# Patient Record
Sex: Female | Born: 1960 | Race: White | Hispanic: No | Marital: Married | Smoking: Never smoker
Health system: Southern US, Community
[De-identification: ages and names within clinical notes are randomized; demographics above are authoritative.]

## PROBLEM LIST (undated history)

## (undated) DIAGNOSIS — E079 Disorder of thyroid, unspecified: Secondary | ICD-10-CM

## (undated) DIAGNOSIS — M1990 Unspecified osteoarthritis, unspecified site: Secondary | ICD-10-CM

## (undated) DIAGNOSIS — D689 Coagulation defect, unspecified: Secondary | ICD-10-CM

## (undated) DIAGNOSIS — Z8619 Personal history of other infectious and parasitic diseases: Secondary | ICD-10-CM

## (undated) DIAGNOSIS — I341 Nonrheumatic mitral (valve) prolapse: Secondary | ICD-10-CM

## (undated) DIAGNOSIS — G43909 Migraine, unspecified, not intractable, without status migrainosus: Secondary | ICD-10-CM

## (undated) DIAGNOSIS — Z86718 Personal history of other venous thrombosis and embolism: Secondary | ICD-10-CM

## (undated) HISTORY — DX: Disorder of thyroid, unspecified: E07.9

## (undated) HISTORY — DX: Coagulation defect, unspecified: D68.9

## (undated) HISTORY — PX: OTHER SURGICAL HISTORY: SHX169

## (undated) HISTORY — DX: Nonrheumatic mitral (valve) prolapse: I34.1

## (undated) HISTORY — PX: TONSILLECTOMY: SUR1361

## (undated) HISTORY — DX: Personal history of other infectious and parasitic diseases: Z86.19

## (undated) HISTORY — DX: Migraine, unspecified, not intractable, without status migrainosus: G43.909

## (undated) HISTORY — DX: Unspecified osteoarthritis, unspecified site: M19.90

## (undated) HISTORY — DX: Personal history of other venous thrombosis and embolism: Z86.718

---

## 1997-04-17 ENCOUNTER — Inpatient Hospital Stay (HOSPITAL_COMMUNITY): Admission: AD | Admit: 1997-04-17 | Discharge: 1997-04-17 | Payer: Self-pay | Admitting: Obstetrics and Gynecology

## 1997-05-15 ENCOUNTER — Inpatient Hospital Stay (HOSPITAL_COMMUNITY): Admission: AD | Admit: 1997-05-15 | Discharge: 1997-05-17 | Payer: Self-pay | Admitting: Obstetrics and Gynecology

## 1997-06-12 ENCOUNTER — Other Ambulatory Visit: Admission: RE | Admit: 1997-06-12 | Discharge: 1997-06-12 | Payer: Self-pay | Admitting: Obstetrics and Gynecology

## 1997-12-12 ENCOUNTER — Observation Stay (HOSPITAL_COMMUNITY): Admission: RE | Admit: 1997-12-12 | Discharge: 1997-12-13 | Payer: Self-pay | Admitting: Urology

## 1998-10-01 ENCOUNTER — Other Ambulatory Visit: Admission: RE | Admit: 1998-10-01 | Discharge: 1998-10-01 | Payer: Self-pay | Admitting: Obstetrics and Gynecology

## 2000-02-09 ENCOUNTER — Other Ambulatory Visit: Admission: RE | Admit: 2000-02-09 | Discharge: 2000-02-09 | Payer: Self-pay | Admitting: Obstetrics and Gynecology

## 2000-03-25 ENCOUNTER — Emergency Department (HOSPITAL_COMMUNITY): Admission: EM | Admit: 2000-03-25 | Discharge: 2000-03-25 | Payer: Self-pay | Admitting: Emergency Medicine

## 2000-03-29 ENCOUNTER — Encounter: Admission: RE | Admit: 2000-03-29 | Discharge: 2000-05-05 | Payer: Self-pay | Admitting: *Deleted

## 2000-06-07 ENCOUNTER — Encounter: Payer: Self-pay | Admitting: Pulmonary Disease

## 2000-06-07 ENCOUNTER — Ambulatory Visit (HOSPITAL_COMMUNITY): Admission: RE | Admit: 2000-06-07 | Discharge: 2000-06-07 | Payer: Self-pay | Admitting: Pulmonary Disease

## 2001-01-30 ENCOUNTER — Encounter: Admission: RE | Admit: 2001-01-30 | Discharge: 2001-03-13 | Payer: Self-pay | Admitting: Orthopedic Surgery

## 2001-06-26 ENCOUNTER — Other Ambulatory Visit: Admission: RE | Admit: 2001-06-26 | Discharge: 2001-06-26 | Payer: Self-pay | Admitting: Obstetrics and Gynecology

## 2002-02-13 ENCOUNTER — Ambulatory Visit (HOSPITAL_BASED_OUTPATIENT_CLINIC_OR_DEPARTMENT_OTHER): Admission: RE | Admit: 2002-02-13 | Discharge: 2002-02-13 | Payer: Self-pay | Admitting: Orthopedic Surgery

## 2002-02-13 ENCOUNTER — Encounter (INDEPENDENT_AMBULATORY_CARE_PROVIDER_SITE_OTHER): Payer: Self-pay | Admitting: Specialist

## 2002-08-27 ENCOUNTER — Other Ambulatory Visit: Admission: RE | Admit: 2002-08-27 | Discharge: 2002-08-27 | Payer: Self-pay | Admitting: Obstetrics and Gynecology

## 2003-09-30 ENCOUNTER — Other Ambulatory Visit: Admission: RE | Admit: 2003-09-30 | Discharge: 2003-09-30 | Payer: Self-pay | Admitting: Obstetrics and Gynecology

## 2003-11-06 ENCOUNTER — Ambulatory Visit: Payer: Self-pay | Admitting: Internal Medicine

## 2003-11-07 ENCOUNTER — Ambulatory Visit (HOSPITAL_BASED_OUTPATIENT_CLINIC_OR_DEPARTMENT_OTHER): Admission: RE | Admit: 2003-11-07 | Discharge: 2003-11-07 | Payer: Self-pay | Admitting: Orthopedic Surgery

## 2003-11-07 ENCOUNTER — Ambulatory Visit (HOSPITAL_COMMUNITY): Admission: RE | Admit: 2003-11-07 | Discharge: 2003-11-07 | Payer: Self-pay | Admitting: Orthopedic Surgery

## 2003-11-07 ENCOUNTER — Encounter (INDEPENDENT_AMBULATORY_CARE_PROVIDER_SITE_OTHER): Payer: Self-pay | Admitting: Specialist

## 2003-11-17 ENCOUNTER — Ambulatory Visit: Payer: Self-pay | Admitting: Cardiology

## 2003-12-10 ENCOUNTER — Ambulatory Visit: Payer: Self-pay | Admitting: Internal Medicine

## 2003-12-24 ENCOUNTER — Ambulatory Visit: Payer: Self-pay | Admitting: *Deleted

## 2004-03-01 ENCOUNTER — Ambulatory Visit: Payer: Self-pay | Admitting: Internal Medicine

## 2004-03-25 ENCOUNTER — Ambulatory Visit: Payer: Self-pay

## 2004-03-25 ENCOUNTER — Ambulatory Visit: Payer: Self-pay | Admitting: Internal Medicine

## 2004-04-01 ENCOUNTER — Ambulatory Visit: Payer: Self-pay

## 2004-04-06 ENCOUNTER — Ambulatory Visit: Payer: Self-pay | Admitting: Internal Medicine

## 2004-05-13 ENCOUNTER — Ambulatory Visit: Payer: Self-pay | Admitting: Cardiology

## 2004-05-24 ENCOUNTER — Ambulatory Visit: Payer: Self-pay | Admitting: Internal Medicine

## 2004-08-25 ENCOUNTER — Ambulatory Visit: Payer: Self-pay | Admitting: Cardiology

## 2004-09-10 ENCOUNTER — Ambulatory Visit: Payer: Self-pay | Admitting: Internal Medicine

## 2004-10-06 ENCOUNTER — Ambulatory Visit: Payer: Self-pay | Admitting: Cardiology

## 2004-11-10 ENCOUNTER — Ambulatory Visit: Payer: Self-pay | Admitting: Internal Medicine

## 2004-11-10 ENCOUNTER — Ambulatory Visit: Payer: Self-pay | Admitting: Cardiology

## 2004-11-16 ENCOUNTER — Other Ambulatory Visit: Admission: RE | Admit: 2004-11-16 | Discharge: 2004-11-16 | Payer: Self-pay | Admitting: Obstetrics and Gynecology

## 2004-12-06 ENCOUNTER — Ambulatory Visit: Payer: Self-pay | Admitting: Internal Medicine

## 2004-12-10 ENCOUNTER — Ambulatory Visit: Payer: Self-pay | Admitting: Internal Medicine

## 2004-12-10 ENCOUNTER — Ambulatory Visit: Payer: Self-pay | Admitting: Cardiology

## 2004-12-13 ENCOUNTER — Ambulatory Visit: Payer: Self-pay | Admitting: Internal Medicine

## 2004-12-13 ENCOUNTER — Ambulatory Visit (HOSPITAL_COMMUNITY): Admission: RE | Admit: 2004-12-13 | Discharge: 2004-12-14 | Payer: Self-pay | Admitting: Internal Medicine

## 2004-12-16 ENCOUNTER — Ambulatory Visit: Payer: Self-pay | Admitting: Cardiology

## 2004-12-20 ENCOUNTER — Ambulatory Visit: Payer: Self-pay | Admitting: Internal Medicine

## 2005-01-10 ENCOUNTER — Ambulatory Visit: Payer: Self-pay | Admitting: Cardiology

## 2005-03-01 ENCOUNTER — Ambulatory Visit: Payer: Self-pay | Admitting: *Deleted

## 2005-03-30 ENCOUNTER — Ambulatory Visit: Payer: Self-pay | Admitting: Internal Medicine

## 2005-04-13 ENCOUNTER — Ambulatory Visit: Payer: Self-pay | Admitting: *Deleted

## 2005-05-26 ENCOUNTER — Ambulatory Visit: Payer: Self-pay | Admitting: Cardiology

## 2005-06-15 ENCOUNTER — Ambulatory Visit: Payer: Self-pay | Admitting: Cardiovascular Disease

## 2005-08-23 ENCOUNTER — Ambulatory Visit: Payer: Self-pay | Admitting: Cardiology

## 2005-09-26 ENCOUNTER — Ambulatory Visit: Payer: Self-pay | Admitting: Cardiology

## 2005-11-09 ENCOUNTER — Ambulatory Visit: Payer: Self-pay | Admitting: Cardiology

## 2005-12-07 ENCOUNTER — Ambulatory Visit: Payer: Self-pay | Admitting: Cardiology

## 2006-01-11 ENCOUNTER — Ambulatory Visit: Payer: Self-pay | Admitting: Cardiology

## 2006-02-22 ENCOUNTER — Ambulatory Visit: Payer: Self-pay | Admitting: Cardiovascular Disease

## 2006-04-25 ENCOUNTER — Ambulatory Visit: Payer: Self-pay | Admitting: Cardiology

## 2006-06-13 ENCOUNTER — Ambulatory Visit: Payer: Self-pay | Admitting: Cardiology

## 2006-07-28 ENCOUNTER — Ambulatory Visit: Payer: Self-pay | Admitting: Cardiology

## 2006-10-11 ENCOUNTER — Ambulatory Visit: Payer: Self-pay | Admitting: Internal Medicine

## 2006-11-27 ENCOUNTER — Ambulatory Visit: Payer: Self-pay | Admitting: Cardiology

## 2007-01-04 HISTORY — PX: ELBOW SURGERY: SHX618

## 2007-01-10 ENCOUNTER — Ambulatory Visit: Payer: Self-pay | Admitting: Cardiovascular Disease

## 2007-02-07 ENCOUNTER — Ambulatory Visit: Payer: Self-pay | Admitting: Internal Medicine

## 2007-03-28 ENCOUNTER — Ambulatory Visit: Payer: Self-pay | Admitting: Cardiology

## 2007-05-09 ENCOUNTER — Ambulatory Visit: Payer: Self-pay | Admitting: Internal Medicine

## 2007-06-21 ENCOUNTER — Ambulatory Visit: Payer: Self-pay | Admitting: Cardiology

## 2007-08-31 ENCOUNTER — Ambulatory Visit: Payer: Self-pay | Admitting: Cardiology

## 2007-11-07 ENCOUNTER — Ambulatory Visit: Payer: Self-pay | Admitting: Cardiovascular Disease

## 2007-12-31 ENCOUNTER — Ambulatory Visit: Payer: Self-pay | Admitting: Cardiology

## 2008-02-13 ENCOUNTER — Ambulatory Visit: Payer: Self-pay | Admitting: Cardiology

## 2008-05-01 ENCOUNTER — Ambulatory Visit: Payer: Self-pay | Admitting: Internal Medicine

## 2008-06-03 ENCOUNTER — Encounter: Payer: Self-pay | Admitting: *Deleted

## 2008-07-09 ENCOUNTER — Encounter: Payer: Self-pay | Admitting: *Deleted

## 2008-07-22 ENCOUNTER — Ambulatory Visit: Payer: Self-pay | Admitting: Cardiology

## 2008-07-22 LAB — CONVERTED CEMR LAB
POC INR: 2
Prothrombin Time: 17.5 s

## 2008-09-02 ENCOUNTER — Ambulatory Visit: Payer: Self-pay | Admitting: Cardiology

## 2008-09-02 LAB — CONVERTED CEMR LAB: POC INR: 2.7

## 2008-09-11 ENCOUNTER — Telehealth: Payer: Self-pay | Admitting: Cardiovascular Disease

## 2008-10-14 ENCOUNTER — Ambulatory Visit: Payer: Self-pay | Admitting: Cardiology

## 2008-10-14 LAB — CONVERTED CEMR LAB: POC INR: 2.7

## 2008-11-25 ENCOUNTER — Encounter: Admission: RE | Admit: 2008-11-25 | Discharge: 2008-11-25 | Payer: Self-pay | Admitting: Obstetrics and Gynecology

## 2008-12-02 ENCOUNTER — Ambulatory Visit: Payer: Self-pay | Admitting: Cardiology

## 2008-12-02 LAB — CONVERTED CEMR LAB: POC INR: 2.6

## 2009-01-03 HISTORY — PX: ROTATOR CUFF REPAIR: SHX139

## 2009-03-04 ENCOUNTER — Ambulatory Visit: Payer: Self-pay | Admitting: Cardiology

## 2009-04-14 ENCOUNTER — Encounter: Admission: RE | Admit: 2009-04-14 | Discharge: 2009-06-10 | Payer: Self-pay | Admitting: Sports Medicine

## 2009-04-20 ENCOUNTER — Ambulatory Visit: Payer: Self-pay | Admitting: Cardiology

## 2009-05-26 ENCOUNTER — Encounter: Admission: RE | Admit: 2009-05-26 | Discharge: 2009-05-26 | Payer: Self-pay | Admitting: Obstetrics and Gynecology

## 2009-06-16 ENCOUNTER — Ambulatory Visit: Payer: Self-pay | Admitting: Cardiovascular Disease

## 2009-06-16 LAB — CONVERTED CEMR LAB: POC INR: 1.2

## 2009-06-29 ENCOUNTER — Ambulatory Visit: Payer: Self-pay | Admitting: Cardiology

## 2009-06-29 LAB — CONVERTED CEMR LAB: POC INR: 1.3

## 2009-07-01 ENCOUNTER — Encounter: Admission: RE | Admit: 2009-07-01 | Discharge: 2009-09-29 | Payer: Self-pay | Admitting: Sports Medicine

## 2009-07-15 ENCOUNTER — Ambulatory Visit: Payer: Self-pay | Admitting: Internal Medicine

## 2009-09-29 ENCOUNTER — Ambulatory Visit: Payer: Self-pay | Admitting: Cardiovascular Disease

## 2009-11-17 ENCOUNTER — Ambulatory Visit: Payer: Self-pay | Admitting: Cardiovascular Disease

## 2009-11-17 LAB — CONVERTED CEMR LAB: POC INR: 3.5

## 2010-01-01 ENCOUNTER — Ambulatory Visit: Payer: Self-pay

## 2010-01-26 ENCOUNTER — Ambulatory Visit: Admission: RE | Admit: 2010-01-26 | Discharge: 2010-01-26 | Payer: Self-pay | Source: Home / Self Care

## 2010-01-26 LAB — CONVERTED CEMR LAB: POC INR: 2.2

## 2010-02-04 NOTE — Medication Information (Signed)
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 no    Recent/future dental: no  Any missed doses?: no       Is patient compliant with meds? yes       Allergies: No Known Drug Allergies  Anticoagulation Management History:      The patient is taking warfarin and comes in today for a routine follow up visit.  Negative risk factors for bleeding include an age less than 78 years old.  The bleeding index is 'low risk'.  Negative CHADS2 values include Age > 77 years old.  The start date was 05/05/1998.  Anticoagulation responsible provider: Juanda Chance MD, Smitty Cords.  INR POC: 2.2.  Cuvette Lot#: [id number].  Exp: 06/2010.    Anticoagulation Management Assessment/Plan:      The patient's current anticoagulation dose is Warfarin sodium 6 mg tabs: Use as directed by Anticoagulation Clinic.  The target INR is 2 - 3.  The next INR is due 06/02/2009.  Anticoagulation instructions were given to patient.  Results were reviewed/authorized by Bethena Midget, RN, BSN.  She was notified by Bethena Midget, RN, BSN.         Prior Anticoagulation Instructions: INR 2.7  Continue on same dosage 6mg  daily.  Recheck in 6 weeks.    Current Anticoagulation Instructions: INR 2.2 Continue 6mg s daily. Recheck in 6 weeks.

## 2010-02-04 NOTE — Medication Information (Signed)
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    Health status changes: no    Bleeding/hemorrhagic complications: no    Recent/future hospitalizations: no    Any changes in medication regimen? yes       Details: Bad HA yesterday took Excedrine Migraine yesterday  Recent/future dental: no  Any missed doses?: no       Is patient compliant with meds? yes       Allergies (verified): No Known Drug Allergies  Anticoagulation Management History:      The patient is taking warfarin and comes in today for a routine follow up visit.  Negative risk factors for bleeding include an age less than 12 years old.  The bleeding index is 'low risk'.  Negative CHADS2 values include Age > 40 years old.  The start date was 05/05/1998.  Anticoagulation responsible provider: Riley Kill MD, Maisie Fus.  INR POC: 2.7.  Cuvette Lot#: [id number].  Exp: 05/2010.    Anticoagulation Management Assessment/Plan:      The patient's current anticoagulation dose is Warfarin sodium 6 mg tabs: Use as directed by Anticoagulation Clinic.  The target INR is 2 - 3.  The next INR is due 04/15/2009.  Anticoagulation instructions were given to patient.  Results were reviewed/authorized by Cloyde Reams, RN, BSN.  She was notified by Cloyde Reams RN.         Prior Anticoagulation Instructions: INR 2.6. Continue taking 1 tablet daily. Recheck in 6 weeks.  Current Anticoagulation Instructions: INR 2.7  Continue on same dosage 6mg  daily.  Recheck in 6 weeks.   Prescriptions: WARFARIN SODIUM 6 MG TABS (WARFARIN SODIUM) Use as directed by Anticoagulation Clinic  #90 x 1   Entered by:   Cloyde Reams RN   Authorized by:   Verne Carrow, MD   Signed by:   Cloyde Reams RN on 03/04/2009   Method used:   Faxed to ...       [facility] (mail-order)       [address]       [place], [place]  [address]       Ph: [phone]       Fax: [phone]   RxID:   [id number]

## 2010-02-04 NOTE — Medication Information (Signed)
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 no    Recent/future dental: no  Any missed doses?: no       Is patient compliant with meds? yes       Allergies: No Known Drug Allergies  Anticoagulation Management History:      The patient is taking warfarin and comes in today for a routine follow up visit.  Negative risk factors for bleeding include an age less than 51 years old.  The bleeding index is 'low risk'.  Negative CHADS2 values include Age > 47 years old.  The start date was 05/05/1998.  Anticoagulation responsible provider: Graciela Husbands MD, Viviann Spare.  INR POC: 2.2.  Cuvette Lot#: [id number].  Exp: 01/2011.    Anticoagulation Management Assessment/Plan:      The patient's current anticoagulation dose is Warfarin sodium 6 mg tabs: Use as directed by Anticoagulation Clinic.  The target INR is 2 - 3.  The next INR is due 03/09/2010.  Anticoagulation instructions were given to patient.  Results were reviewed/authorized by Windell Hummingbird, RN.  She was notified by Windell Hummingbird, RN.         Prior Anticoagulation Instructions: INR 3.5 Skip tomorrow's dose  then resume 6mg s daily. Recheck in 6 weeks.   Current Anticoagulation Instructions: INR 2.2 Continue taking 1 tablet every day. Recheck in 6 weeks. Prescriptions: WARFARIN SODIUM 6 MG TABS (WARFARIN SODIUM) Use as directed by Anticoagulation Clinic  #90 x 1   Entered by:   Weston Brass PharmD   Authorized by:   Nathen May, MD, [facility]   Signed by:   Weston Brass PharmD on 01/26/2010   Method used:   Faxed  to ...       [facility] (mail-order)             , [place]         Ph: [phone]       Fax: [phone]   RxID:   [id number]

## 2010-02-04 NOTE — Medication Information (Signed)
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 no    Recent/future dental: no  Any missed doses?: no       Is patient compliant with meds? yes      Comments: Pt aware of her risk with elevated INR and INR that are not in range.  She states due to co-pays she doesn't want to come in sooner than 6 weeks.   Allergies: No Known Drug Allergies  Anticoagulation Management History:      The patient is taking warfarin and comes in today for a routine follow up visit.  Negative risk factors for bleeding include an age less than 35 years old.  The bleeding index is 'low risk'.  Negative CHADS2 values include Age > 2 years old.  The start date was 05/05/1998.  Anticoagulation responsible provider: Excell Seltzer MD, Casimiro Needle.  INR POC: 3.5.  Cuvette Lot#: [id number].  Exp: 12/2010.    Anticoagulation Management Assessment/Plan:      The patient's current anticoagulation dose is Warfarin sodium 6 mg tabs: Use as directed by Anticoagulation Clinic.  The target INR is 2 - 3.  The next INR is due 01/01/2010.  Anticoagulation instructions were given to patient.  Results were reviewed/authorized by Bethena Midget, RN, BSN.  She was notified by Bethena Midget, RN, BSN.         Prior Anticoagulation Instructions: INR 2.5  Continue on 6mg  daily.  Recheck in 6 weeks.    Current Anticoagulation Instructions: INR 3.5 Skip tomorrow's dose  then resume 6mg s daily. Recheck in 6 weeks.

## 2010-02-04 NOTE — Medication Information (Signed)
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 yes       Details: Celebrex prn  Recent/future dental: no  Any missed doses?: no       Is patient compliant with meds? yes       Allergies: No Known Drug Allergies  Anticoagulation Management History:      The patient is taking warfarin and comes in today for a routine follow up visit.  Negative risk factors for bleeding include an age less than 7 years old.  The bleeding index is 'low risk'.  Negative CHADS2 values include Age > 17 years old.  The start date was 05/05/1998.  Anticoagulation responsible Holle Sprick: Eden Emms MD, Theron Arista.  INR POC: 2.5.  Cuvette Lot#: [id number].  Exp: 11/2010.    Anticoagulation Management Assessment/Plan:      The patient's current anticoagulation dose is Warfarin sodium 6 mg tabs: Use as directed by Anticoagulation Clinic.  The target INR is 2 - 3.  The next INR is due 11/10/2009.  Anticoagulation instructions were given to patient.  Results were reviewed/authorized by Cloyde Reams, RN, BSN.  She was notified by Cloyde Reams RN.         Prior Anticoagulation Instructions: INR 2.6  Continue same dose of 6mg  daily.  Re-check INR in 4 weeks.    Current Anticoagulation Instructions: INR 2.5  Continue on 6mg  daily.  Recheck in 6 weeks.   Prescriptions: WARFARIN SODIUM 6 MG TABS (WARFARIN SODIUM) Use as directed by Anticoagulation Clinic  #90 x 1   Entered by:   Cloyde Reams RN   Authorized by:   Laren Boom, MD, [facility]   Signed by:   Cloyde Reams RN on  09/29/2009   Method used:   Faxed to ...       [facility] (mail-order)       [address]       [place], [place]  [address]       Ph: [phone]       Fax: [phone]   RxID:   [id number]

## 2010-02-04 NOTE — Medication Information (Signed)
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 no    Recent/future dental: no  Any missed doses?: yes     Details: pt off coumadin for shoulder surgery  Is patient compliant with meds? yes      Comments: Pt confused and did not know when to restart Coumadin and did not understand to take Coumadin and Lovenox at the same time.  Restarted Coumadin on last Thursday or Friday. Has not been taking Lovenox injections.  Going on vacation next week and cannot come back in until next Wednesday.   Allergies: No Known Drug Allergies  Anticoagulation Management History:      The patient is taking warfarin and comes in today for a routine follow up visit.  Negative risk factors for bleeding include an age less than 50 years old.  The bleeding index is 'low risk'.  Negative CHADS2 values include Age > 13 years old.  The start date was 05/05/1998.  Anticoagulation responsible provider: Jens Som MD, Arlys John.  INR POC: 1.3.  Cuvette Lot#: [id number].  Exp: 08/2010.    Anticoagulation Management Assessment/Plan:      The patient's current anticoagulation dose is Warfarin sodium 6 mg tabs: Use as directed by Anticoagulation Clinic.  The target INR is 2 - 3.  The next INR is due 07/08/2009.  Anticoagulation instructions were given to patient.  Results were reviewed/authorized by Weston Brass, PharmD.  She was notified by Weston Brass PharmD.         Prior Anticoagulation Instructions: INR 1.2 Hold coumadin. Start Lovenox 120mg s daily. Take last Injection on 06/22/09 in Morning.  Restart coumadin and Lovenox post procedure per Dr. Thomasena Edis instructions. When you restart Coumadin take 1 1/2 tablets x 2 days.  Continue Lovenox until next appt.   Current Anticoagulation Instructions: INR 1.3  Take 2 tablets today, 1 1/2 tablets x 2 days then resume normal dose of 1 tablet every day.  Continue Lovenox 120mg  daily until Friday.  Recheck INR in 1 week.

## 2010-02-04 NOTE — Medication Information (Signed)
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 no    Recent/future dental: no  Any missed doses?: yes     Details: Missed Sat and Sun dose.   Is patient compliant with meds? yes      Comments: Pending Rt Shoulder Surgery on 06/23/09.  Received fax from Dr. Thomasena Edis with instructions to cover with Lovenox pre and post operatively.   Allergies: No Known Drug Allergies  Anticoagulation Management History:      The patient is taking warfarin and comes in today for a routine follow up visit.  Negative risk factors for bleeding include an age less than 75 years old.  The bleeding index is 'low risk'.  Negative CHADS2 values include Age > 77 years old.  The start date was 05/05/1998.  Anticoagulation responsible provider: Juanda Chance MD, Smitty Cords.  INR POC: 1.2.  Cuvette Lot#: [id number].  Exp: 08/2010.    Anticoagulation Management Assessment/Plan:      The patient's current anticoagulation dose is Warfarin sodium 6 mg tabs: Use as directed by Anticoagulation Clinic.  The target INR is 2 - 3.  The next INR is due 06/29/2009.  Anticoagulation instructions were given to patient.  Results were reviewed/authorized by Bethena Midget, RN, BSN.  She was notified by Bethena Midget, RN, BSN.         Prior Anticoagulation Instructions: INR 2.2 Continue 6mg s daily. Recheck in 6 weeks.   Current Anticoagulation Instructions: INR 1.2 Hold coumadin. Start Lovenox 120mg s daily. Take last Injection on 06/22/09 in Morning. Restart coumadin and Lovenox post procedure per Dr. Thomasena Edis  instructions. When you restart Coumadin take 1 1/2 tablets x 2 days.  Continue Lovenox until next appt.  Prescriptions: LOVENOX 120 MG/0.8ML SOLN (ENOXAPARIN SODIUM) Inject one syringe subcutaneously into abdomen daily in morning  #10 x 1   Entered by:   Bethena Midget, RN, BSN   Authorized by:   Norva Karvonen, MD   Signed by:   Bethena Midget, RN, BSN on 06/16/2009   Method used:   Electronically to        [facility] [address] [id number] (retail)       [address]       [place], [place]  [address]       Ph: [phone]       Fax: [phone]   RxID:   [id number]

## 2010-02-04 NOTE — Medication Information (Signed)
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 no    Recent/future dental: no  Any missed doses?: no       Is patient compliant with meds? yes       Allergies: No Known Drug Allergies  Anticoagulation Management History:      The patient is taking warfarin and comes in today for a routine follow up visit.  Negative risk factors for bleeding include an age less than 73 years old.  The bleeding index is 'low risk'.  Negative CHADS2 values include Age > 11 years old.  The start date was 05/05/1998.  Anticoagulation responsible provider: Yelitza Reach MD, Fayrene Fearing.  INR POC: 2.6.  Cuvette Lot#: [id number].  Exp: 09/2010.    Anticoagulation Management Assessment/Plan:      The patient's current anticoagulation dose is Warfarin sodium 6 mg tabs: Use as directed by Anticoagulation Clinic.  The target INR is 2 - 3.  The next INR is due 08/26/2009.  Anticoagulation instructions were given to patient.  Results were reviewed/authorized by Weston Brass, PharmD.  She was notified by Dillard Cannon.         Prior Anticoagulation Instructions: INR 1.3  Take 2 tablets today, 1 1/2 tablets x 2 days then resume normal dose of 1 tablet every day.  Continue Lovenox 120mg  daily until Friday.  Recheck INR in 1 week.   Current Anticoagulation Instructions: INR 2.6  Continue same dose of 6mg  daily.  Re-check INR in 4 weeks.

## 2010-02-15 DIAGNOSIS — Z86711 Personal history of pulmonary embolism: Secondary | ICD-10-CM | POA: Insufficient documentation

## 2010-02-15 DIAGNOSIS — D6859 Other primary thrombophilia: Secondary | ICD-10-CM

## 2010-02-15 DIAGNOSIS — I2699 Other pulmonary embolism without acute cor pulmonale: Secondary | ICD-10-CM

## 2010-03-09 ENCOUNTER — Encounter (INDEPENDENT_AMBULATORY_CARE_PROVIDER_SITE_OTHER): Payer: Managed Care, Other (non HMO)

## 2010-03-09 ENCOUNTER — Encounter: Payer: Self-pay | Admitting: Cardiology

## 2010-03-09 DIAGNOSIS — I2699 Other pulmonary embolism without acute cor pulmonale: Secondary | ICD-10-CM

## 2010-03-09 LAB — CONVERTED CEMR LAB: POC INR: 2.2

## 2010-03-16 NOTE — Medication Information (Signed)
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 no    Recent/future dental: no  Any missed doses?: no       Is patient compliant with meds? yes       Current Medications (verified): 1)  Wellbutrin Xl 300 Mg Xr24h-Tab (Bupropion Hcl) .... Take 1 Tablet Daily 2)  Warfarin Sodium 6 Mg Tabs (Warfarin Sodium) .... Use As Directed By Anticoagulation Clinic 3)  Lovenox 120 Mg/0.40ml Soln (Enoxaparin Sodium) .... Inject One Syringe Subcutaneously Into Abdomen Daily in Morning  Allergies (verified): No Known Drug Allergies  Anticoagulation Management History:      Negative risk factors for bleeding include an age less than 63 years old.  The bleeding index is 'low risk'.  Negative CHADS2 values include Age > 96 years old.  The start date was 05/05/1998.  Anticoagulation responsible provider: Antoine Poche MD, Fayrene Fearing.  INR POC: 2.2.  Exp: 01/2011.    Anticoagulation Management Assessment/Plan:      The patient's current anticoagulation dose is Warfarin sodium 6 mg tabs: Use as directed by Anticoagulation Clinic.  The target INR is 2 - 3.  The next INR is due 03/09/2010.  Anticoagulation instructions were given to patient.  Results were reviewed/authorized by Samantha Crimes, PharmD.         Prior Anticoagulation Instructions: INR 2.2 Continue taking 1 tablet every day. Recheck in 6 weeks.  Current Anticoagulation Instructions: Cont with current regimen Return to clinic 4/17 @ 2 pm

## 2010-04-20 ENCOUNTER — Ambulatory Visit (INDEPENDENT_AMBULATORY_CARE_PROVIDER_SITE_OTHER): Payer: Managed Care, Other (non HMO) | Admitting: *Deleted

## 2010-04-20 DIAGNOSIS — D6859 Other primary thrombophilia: Secondary | ICD-10-CM

## 2010-04-20 DIAGNOSIS — I2699 Other pulmonary embolism without acute cor pulmonale: Secondary | ICD-10-CM

## 2010-04-20 LAB — POCT INR: INR: 2.6

## 2010-04-25 ENCOUNTER — Emergency Department (HOSPITAL_BASED_OUTPATIENT_CLINIC_OR_DEPARTMENT_OTHER)
Admission: EM | Admit: 2010-04-25 | Discharge: 2010-04-25 | Disposition: A | Discharge status: Home / Self Care | Payer: Managed Care, Other (non HMO) | Attending: Emergency Medicine | Admitting: Emergency Medicine

## 2010-04-25 DIAGNOSIS — T622X1A Toxic effect of other ingested (parts of) plant(s), accidental (unintentional), initial encounter: Secondary | ICD-10-CM | POA: Insufficient documentation

## 2010-04-25 DIAGNOSIS — L255 Unspecified contact dermatitis due to plants, except food: Secondary | ICD-10-CM | POA: Insufficient documentation

## 2010-05-21 NOTE — Op Note (Signed)
   NAMEDUSTINE, BERTINI                        ACCOUNT NO.:  [id number]   MEDICAL RECORD NO.:  [id number]                   PATIENT TYPE:  AMB   LOCATION:  [facility]                                  FACILITY:  [facility]   PHYSICIAN:  Artist Pais. Mina Marble, M.D.           DATE OF BIRTH:  Dec 04, 1960   DATE OF PROCEDURE:  02/13/2002  DATE OF DISCHARGE:                                 OPERATIVE REPORT   PREOPERATIVE DIAGNOSIS:  Right elbow lateral epicondylitis.   POSTOPERATIVE DIAGNOSIS:  Right elbow lateral epicondylitis.   PROCEDURE:  Right Nirschl procedure.   SURGEON:  Artist Pais. Mina Marble, M.D.   ASSISTANT:  None.   ANESTHESIA:  General.   TOURNIQUET TIME:  40 minutes.   COMPLICATIONS:  None.   DRAINS:  None.   DESCRIPTION OF PROCEDURE:  The patient was taken to the operating room.  After the induction general anesthesia, the upper extremity was prepped and  draped in usual sterile fashion.  An Esmarch was used to exsanguinate the  limb.  Tourniquet was inflated to 250 mmHg.  At this point in time, a  longitudinal incision was made across the palpable lateral epicondyle of the  right elbow and extended proximally and distally for 5 cm.  The incision was  taken down through the skin and subcutaneous tissues.  Once this was done,  the lateral epicondyle was subperiosteally elevated in the interval between  the common extensor and ECRL was identified and split, thus exposing the  underlying ECB tendon.  The ECB tendon was debrided down to cancellous bone.  Once this was done, the joint was inspected.  At the radiocapitellar level,  there were no osteophytes or loose bodies noted and the wound was then  thoroughly irrigated.  The interval between the ECRL and the [facility] was closed  with 0 Vicryl and with buried interrupted sutures followed by skin closure  with 3-0 Prolene.  Steri-Strips, 4 x 4 fluffs, and a splint with the wrist  in extension and the elbow flexed 90 degrees forearm  rotation was placed.  The patient tolerated the procedure well.  She went to recovery in stable  fashion.                                               Artist Pais Mina Marble, M.D.    MAW/MEDQ  D:  02/13/2002  T:  02/13/2002  Job:  [id number]
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
## 2010-05-21 NOTE — Op Note (Signed)
NAMESHYENNE, MAGGARD NO.:  [id number]   MEDICAL RECORD NO.:  [id number]          PATIENT TYPE:  OIB   LOCATION:  2852                         FACILITY:  [facility]   PHYSICIAN:  Doylene Canning. Ladona Ridgel, M.D.  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   INTRODUCTION:  The patient is a very pleasant 50 year old woman with a  history of recurrent tachy palpitations and documented SVT in the past  despite medical therapy.  The patient is now referred for electrophysiologic  study and catheter ablation.   DESCRIPTION OF PROCEDURE:  After informed consent was obtained, the patient  was taken to the diagnostic EP lab in the fasting state.  After the usual  preparation and draping, intravenous fentanyl  and midazolam were given for  sedation.  A 6-French hexapolar catheter was inserted percutaneously into  the right jugular vein and advanced to the coronary sinus.  A 5-French  quadripolar catheter was inserted percutaneously into the right femoral vein  and advanced to the RV apex. A  5-French quadripolar catheter was inserted  percutaneously in the right femoral vein and advanced to the HIS bundle  region.  Measurement of basic intervals, rapid ventricular pacing was  carried out from the RV apex and stepwise decreased down to 500 msec where  Wenckebach was observed.  During rapid ventricular pacing, the atrial  activation was midline and decremental. Next, programmed ventricular  stimulation was carried out from the RV apex and the basic drive cycle  length of 478 msec.  This S1 and S2 interval __________  decreased down to  400 msec.  The retrograde AV node ARP is observed. During programming of  ventricular stimulation, the atrial was midline and decremental.   Next, rapid atrial  pacing was carried out from the coronary sinus as well as  the high right atrium.  The patient's cycle length was 600 msec and  __________  decreased down to 510 msec or AV Wenckebach was observed.  During rapid atrial pacing, the P-R interval was greater than the R  interval, but there was no SVT induced.   Atrial stimulation was then carried out from the coronary sinus and the high  right atrium at basic drive cycle length of 295 msec.  The S1 and S2  interval was __________  decreased from 500 msec down to 380 msec with where  AV node ERP was observed.  During programmed atrial stimulation, there were  multiple AH jumps and echo beats but no inducible SVT.  Isoproterenol was  then infused at a rate of 1-3 mcg per minute.  Additional rapid atrial  pacing was carried out at a pacing cycle length of 350 msec.  SVT was  induced.  This was a narrow QRS tachycardia with a cycle length of 380 msec.  Mapping demonstrated early atrial activation in the proximal CS as well as  the HIS bundle region.  PVCs were placed during  tachycardia at the time of  HIS bundle refractions and this failed to preexcite the atrium.  During  ventricular pacing during tachycardia, there was a BAV conduction sequence  and rapid ventricular pacing rather terminated the SVT.  With all of the  above diagnosis of unusual AV node reentry tachycardia was made, and the 7-  [place] quadripolar ablation catheter was advanced into the right atrium.  Mapping was then carried out in the right atrium.  The Koch's triangle was  oriented superiorly and was somewhat smaller than would be expected.  RF  energy application was delivered.  There was a total of 12 brief RF energy  applications delivered.  During RF energy application, there were recurrent  junctional beats and many junctional beats with retrograde block in the  atrium.  With this in mind, RF energy application was kept very brief and  when there was VA block, RF was  immediately terminated.  Following RF energy  application, additional rapid atrial and ventricular pacing was carried out  on isoproterenol and there was no inducible SVT.  At this point the  catheters were removed.  Hemostasis was assured and the patient was returned  to her room in satisfactory condition.   COMPLICATIONS:  There were no immediate complications.   RESULTS:  1.  Baseline ECG:  The patient baseline ECG demonstrates sinus rhythm with      normal axis and intervals.  2.  Baseline intervals:  Sinus node cycle length was 867 msec.  The HV      interval was 70 msec, AH interval 88 msec.  The QRS duration was 100      msec.  3.  Rapid ventricular pacing:  Rapid ventricular pacing demonstrated VA      Wenckebach cycle length of 500 msec.  During rapid ventricular pacing,      the atrial activation was midline and decremental.  4.  Programmed ventricular stimulation:  Programmed ventricular stimulation      was carried out from the RV apex to the basic drive cycle length of 562      msec.  The S1 and S2 interval was decreased down to 400 msec for the      retrograde AV node.  ERP was observed during programmed ventricular      stimulation.  The AH activation was midline and decremental.  5.  Rapid atrial pacing.  Rapid atrial pacing was carried out from the high      right atrium as well as the coronary sinus.  The pacing cycle length was      600 msec and stepwise was decreased down to 510 msec where AV Wenckebach      was observed.  Isoproterenol infusion resulted in an AV Wenckebach cycle      length after RF energy application 280 msec.  It should be noted that      during rapid atrial pacing on isoproterenol there was inducible SVT.  6.  Programmed atrial stimulation:  Programmed atrial stimulation was      carried out from the coronary sinus at a basic drive cycle length of      600, 500, and 400 msec.  S1 and S2 interval __________  decreased down     to AV node ERP  which was initially 600/380.  During programmed atrial      stimulation, there were multiple AH jumps and echo beats.  Following      ablation, there were no AH jumps and echo  beats.  7.  __________  observed:  AV node reentry tachycardia, initiation rapid      atrial pacing on isoproterenol.  Duration was sustained.  Termination      was with rapid ventricular pacing.  8.  AH mapping:  Mapping of the Koch's triangle demonstrated unusually      superiorly oriented Koch's triangle which was smaller than would be      expected.  9.  RF energy application:  A total of 12 RF energy applications were      delivered to sites 6 through 8 and Koch's triangle resulted in      accelerated junctional rhythm. During ablation, there was VA block      noted.  Because of this, RF energy application was kept brief in      duration.   CONCLUSION:  The study demonstrates successful electrolyte study and RF  catheter ablation of unusual AV node reentry tachycardia with a total of 12  RF energy applications delivered in brief duration to multiple sites, 6  through 8 and Koch's triangle.  There were no immediate procedure  complications.           ______________________________  Doylene Canning. Ladona Ridgel, M.D.     GWT/MEDQ  D:  12/13/2004  T:  12/13/2004  Job:  [id number]

## 2010-05-21 NOTE — Discharge Summary (Signed)
Melinda, Benton            ACCOUNT NO.:  [id number]   MEDICAL RECORD NO.:  [id number]          PATIENT TYPE:  OIB   LOCATION:  6531                         FACILITY:  [facility]   PHYSICIAN:  Doylene Canning. Ladona Ridgel, M.D.  DATE OF BIRTH:  1960/09/24   DATE OF ADMISSION:  12/13/2004  DATE OF DISCHARGE:  12/14/2004                                 DISCHARGE SUMMARY   PRINCIPAL DIAGNOSES:  1.  Symptomatic tachy-palpitations/documented supraventricular tachycardia.  2.  Discharging day 1 status post electrophysiology study, radiofrequency      catheter ablation of AVNRT, Dr. Lewayne Bunting.   SECONDARY DIAGNOSES:  1.  History of protein S deficiency.  2.  History of pulmonary emboli.  3.  Chronic Coumadin therapy.  4.  History of migraine headaches.  5.  Mitral valve prolapse.   ALLERGIES:  NO KNOWN DRUG ALLERGIES.   PROCEDURE:  1.  December 13, 2004 - Electrophysiology study radiofrequency catheter      ablation of AV nodal re-entry tachycardia with successful slow P wave      modification, Dr. Lewayne Bunting.   BRIEF HISTORY:  Melinda Benton is a 50 year old female who has a history of  recurrent tachy-palpitations. She has been on multiple medications in the  past for documented supraventricular tachycardia. This includes beta  blockers. However, she has discontinued beta blocker therapy several months  ago. The patient is tolerating Coumadin for history of pulmonary embolism.  The patient is interested in electrophysiology study radiofrequency catheter  ablation as she is finding that her tachy-palpitations are becoming more  frequent and are associated with exercise. The patient is very vigorous in  pursuing aerobic exercise regimen. The patient will stop Coumadin 3 days  ahead of time and will be on Lovenox coming into the procedure.   HOSPITAL COURSE:  The patient presented December 13, 2004 for elective  electrophysiology study.  Her supraventricular tachycardia proved to be an  AV  nodal re-entry tachycardia on electrophysiology study. This was  successfully modified with slow P wave modification. The patient continues  on Lovenox for a period of 36 hours post procedurally and will present to  the Coumadin clinic on Thursday, December 16, 2004 at 3:45 p.m.  She will  also see Dr. Ladona Ridgel on Tuesday, January 25, 2005 at 10:45 in the morning.   LABORATORY DATA:  Laboratory studies pertaining to this admission December 13, 2004:  Complete blood count:  White cells 2.9, hemoglobin 13, hematocrit  37.5, platelets 165. On December 13, 2004, protime was 16.2, INR 1.3. The  PTT was 44. The patient taking Lovenox.   Following successful radiofrequency catheter ablation, the patient did  experience a left-sided migraine headache. This was at first treated with  Demerol and Phenergan as well as a cold pack to the affected area, which  also included the nuchal area on the left side. The patient also received  Fioricet on the morning of December 14, 2004, two tablets, which contained  mild sedative and caffeine, and the patient did report significant relief  with brightening of mood and affect. The patient also received a  prescription for  Fioricet at discharge. She will also continue on her other  medications including Coumadin 6 mg daily, Wellbutrin XL 300 mg daily, other  medications which she uses for acute onset of migraine headache.   The patient is able to shower. She is to call [phone] if she experiences  pain or swelling at the cath site.  She is asked not to engage in heavy  exercise for 1 week.   Discharge encounter plus dictation is 40 minutes.      Maple Mirza, P.A.    ______________________________  Doylene Canning. Ladona Ridgel, M.D.    GM/MEDQ  D:  12/14/2004  T:  12/14/2004  Job:  [id number]   cc:   Doylene Canning. Ladona Ridgel, M.D.  [address]  [address]  [place]  [place] [address]

## 2010-06-03 ENCOUNTER — Encounter: Payer: Managed Care, Other (non HMO) | Admitting: *Deleted

## 2010-06-07 ENCOUNTER — Ambulatory Visit (INDEPENDENT_AMBULATORY_CARE_PROVIDER_SITE_OTHER): Payer: Managed Care, Other (non HMO) | Admitting: *Deleted

## 2010-06-07 DIAGNOSIS — D6859 Other primary thrombophilia: Secondary | ICD-10-CM

## 2010-06-07 DIAGNOSIS — I2699 Other pulmonary embolism without acute cor pulmonale: Secondary | ICD-10-CM

## 2010-07-19 ENCOUNTER — Encounter: Payer: Managed Care, Other (non HMO) | Admitting: *Deleted

## 2010-07-20 ENCOUNTER — Other Ambulatory Visit: Payer: Self-pay | Admitting: Obstetrics and Gynecology

## 2010-07-30 ENCOUNTER — Encounter: Payer: Managed Care, Other (non HMO) | Admitting: *Deleted

## 2010-08-04 ENCOUNTER — Ambulatory Visit (INDEPENDENT_AMBULATORY_CARE_PROVIDER_SITE_OTHER): Payer: Managed Care, Other (non HMO) | Admitting: *Deleted

## 2010-08-04 DIAGNOSIS — Z7901 Long term (current) use of anticoagulants: Secondary | ICD-10-CM | POA: Insufficient documentation

## 2010-08-04 DIAGNOSIS — D6859 Other primary thrombophilia: Secondary | ICD-10-CM

## 2010-08-04 DIAGNOSIS — I2699 Other pulmonary embolism without acute cor pulmonale: Secondary | ICD-10-CM

## 2010-08-04 LAB — POCT INR: INR: 2.9

## 2010-08-04 MED ORDER — WARFARIN SODIUM 6 MG PO TABS: ORAL_TABLET | ORAL | Status: DC

## 2010-09-24 ENCOUNTER — Encounter: Payer: Managed Care, Other (non HMO) | Admitting: *Deleted

## 2010-12-13 ENCOUNTER — Telehealth: Payer: Self-pay | Admitting: Internal Medicine

## 2010-12-13 NOTE — Telephone Encounter (Signed)
Pt needs refill of coumadin 6 mg, uses mail order [phone]

## 2010-12-15 ENCOUNTER — Ambulatory Visit (INDEPENDENT_AMBULATORY_CARE_PROVIDER_SITE_OTHER): Payer: Managed Care, Other (non HMO) | Admitting: *Deleted

## 2010-12-15 DIAGNOSIS — Z7901 Long term (current) use of anticoagulants: Secondary | ICD-10-CM

## 2010-12-15 DIAGNOSIS — I2699 Other pulmonary embolism without acute cor pulmonale: Secondary | ICD-10-CM

## 2010-12-15 DIAGNOSIS — D6859 Other primary thrombophilia: Secondary | ICD-10-CM

## 2010-12-15 MED ORDER — WARFARIN SODIUM 6 MG PO TABS: ORAL_TABLET | ORAL | Status: DC

## 2010-12-15 NOTE — Telephone Encounter (Signed)
Rx done during [facility] visit on 12/15/10

## 2010-12-30 ENCOUNTER — Other Ambulatory Visit: Payer: Self-pay | Admitting: *Deleted

## 2010-12-30 ENCOUNTER — Other Ambulatory Visit: Payer: Self-pay | Admitting: Cardiology

## 2010-12-30 MED ORDER — WARFARIN SODIUM 6 MG PO TABS: ORAL_TABLET | ORAL | Status: DC

## 2010-12-30 NOTE — Telephone Encounter (Signed)
Pt notified of 90 day supply of coumadin called in to [facility].

## 2010-12-30 NOTE — Telephone Encounter (Signed)
New message: Melinda Benton was never received by her mail order co.  Please re-send the mail order and call in 1 month supply to [facility] on [address] to hold her until this comes in.

## 2010-12-30 NOTE — Telephone Encounter (Signed)
Spoke to the pt. Script sent to [facility] per pt request. Will attempt to call [facility] to see what the problem is with filling this script before Feb 12, 2011

## 2010-12-30 NOTE — Telephone Encounter (Signed)
Script called to [facility] for 90 day supply. Pt also notified

## 2011-01-06 ENCOUNTER — Encounter: Payer: Managed Care, Other (non HMO) | Admitting: *Deleted

## 2011-01-11 ENCOUNTER — Ambulatory Visit (INDEPENDENT_AMBULATORY_CARE_PROVIDER_SITE_OTHER): Payer: Managed Care, Other (non HMO) | Admitting: *Deleted

## 2011-01-11 DIAGNOSIS — I2699 Other pulmonary embolism without acute cor pulmonale: Secondary | ICD-10-CM

## 2011-01-11 DIAGNOSIS — D6859 Other primary thrombophilia: Secondary | ICD-10-CM

## 2011-01-11 DIAGNOSIS — Z7901 Long term (current) use of anticoagulants: Secondary | ICD-10-CM

## 2011-02-15 ENCOUNTER — Other Ambulatory Visit: Payer: Self-pay | Admitting: Pharmacist

## 2011-02-15 MED ORDER — WARFARIN SODIUM 6 MG PO TABS: ORAL_TABLET | ORAL | Status: DC

## 2011-02-16 ENCOUNTER — Encounter: Payer: Self-pay | Admitting: *Deleted

## 2011-02-16 NOTE — Telephone Encounter (Signed)
This encounter was created in error - please disregard.

## 2011-03-10 ENCOUNTER — Ambulatory Visit (INDEPENDENT_AMBULATORY_CARE_PROVIDER_SITE_OTHER): Payer: Managed Care, Other (non HMO) | Admitting: *Deleted

## 2011-03-10 DIAGNOSIS — I2699 Other pulmonary embolism without acute cor pulmonale: Secondary | ICD-10-CM

## 2011-03-10 DIAGNOSIS — D6859 Other primary thrombophilia: Secondary | ICD-10-CM

## 2011-03-10 DIAGNOSIS — Z7901 Long term (current) use of anticoagulants: Secondary | ICD-10-CM

## 2011-03-10 MED ORDER — WARFARIN SODIUM 6 MG PO TABS: ORAL_TABLET | ORAL | Status: DC

## 2011-03-11 ENCOUNTER — Telehealth: Payer: Self-pay | Admitting: Internal Medicine

## 2011-03-11 NOTE — Telephone Encounter (Signed)
[facility]  [phone] ext [phone]  Please return call to [facility] concerning verbal order for home finger stick meter.  He can be reached at the number above.

## 2011-03-11 NOTE — Telephone Encounter (Signed)
LM for [facility] on answering machine.

## 2011-03-14 NOTE — Telephone Encounter (Signed)
Spoke with [place].  He wanted to make sure we had received fax to order INR machine for pt.  Dr. Eden Emms has agreed to monitor pt's INRs himself and so we will order the machine for the patient.  Elita Quick is going to refax forms to the clinic for signature.

## 2011-03-16 NOTE — Telephone Encounter (Signed)
Please IGNORE phone note from 3/11.  Information related to different patient.  I spoke with Mrs. Tuccillo.  She initiated the home monitoring herself.  She has not seen a [facility] MD in several years.  Informed pt she would need to see someone in order to have the paperwork signed and approved by an MD. She will let us know when she sees someone so we can complete the process.

## 2011-03-29 ENCOUNTER — Other Ambulatory Visit: Payer: Self-pay

## 2011-03-29 MED ORDER — WARFARIN SODIUM 6 MG PO TABS: ORAL_TABLET | ORAL | Status: DC

## 2011-03-30 ENCOUNTER — Other Ambulatory Visit: Payer: Self-pay | Admitting: *Deleted

## 2011-03-30 MED ORDER — WARFARIN SODIUM 6 MG PO TABS: ORAL_TABLET | ORAL | Status: DC

## 2011-04-11 ENCOUNTER — Telehealth: Payer: Self-pay | Admitting: Internal Medicine

## 2011-04-11 NOTE — Telephone Encounter (Signed)
Pt calling re Monia Pouch not receiving warfarin 6 mg from refill we called in 3-27, needs called back in as well as a 10 day suppli to [facility] pls call when done pt out [phone]

## 2011-04-12 MED ORDER — WARFARIN SODIUM 6 MG PO TABS: ORAL_TABLET | ORAL | Status: DC

## 2011-04-12 NOTE — Telephone Encounter (Signed)
Spoke with pt.  Resent Rx.

## 2011-05-05 ENCOUNTER — Ambulatory Visit (INDEPENDENT_AMBULATORY_CARE_PROVIDER_SITE_OTHER): Payer: Managed Care, Other (non HMO) | Admitting: *Deleted

## 2011-05-05 DIAGNOSIS — D6859 Other primary thrombophilia: Secondary | ICD-10-CM

## 2011-05-05 DIAGNOSIS — I2699 Other pulmonary embolism without acute cor pulmonale: Secondary | ICD-10-CM

## 2011-05-05 DIAGNOSIS — Z7901 Long term (current) use of anticoagulants: Secondary | ICD-10-CM

## 2011-08-18 ENCOUNTER — Ambulatory Visit (INDEPENDENT_AMBULATORY_CARE_PROVIDER_SITE_OTHER): Payer: Managed Care, Other (non HMO)

## 2011-08-18 DIAGNOSIS — D6859 Other primary thrombophilia: Secondary | ICD-10-CM

## 2011-08-18 DIAGNOSIS — Z7901 Long term (current) use of anticoagulants: Secondary | ICD-10-CM

## 2011-08-18 DIAGNOSIS — I2699 Other pulmonary embolism without acute cor pulmonale: Secondary | ICD-10-CM

## 2011-08-18 MED ORDER — WARFARIN SODIUM 6 MG PO TABS: ORAL_TABLET | ORAL | Status: DC

## 2011-09-26 ENCOUNTER — Ambulatory Visit (INDEPENDENT_AMBULATORY_CARE_PROVIDER_SITE_OTHER): Payer: Managed Care, Other (non HMO) | Admitting: *Deleted

## 2011-09-26 DIAGNOSIS — D6859 Other primary thrombophilia: Secondary | ICD-10-CM

## 2011-09-26 DIAGNOSIS — Z7901 Long term (current) use of anticoagulants: Secondary | ICD-10-CM

## 2011-09-26 DIAGNOSIS — I2699 Other pulmonary embolism without acute cor pulmonale: Secondary | ICD-10-CM

## 2011-09-26 LAB — POCT INR: INR: 1.9

## 2011-11-25 ENCOUNTER — Other Ambulatory Visit: Payer: Self-pay | Admitting: General Practice

## 2011-11-25 ENCOUNTER — Ambulatory Visit (INDEPENDENT_AMBULATORY_CARE_PROVIDER_SITE_OTHER): Payer: Managed Care, Other (non HMO) | Admitting: General Practice

## 2011-11-25 DIAGNOSIS — D6859 Other primary thrombophilia: Secondary | ICD-10-CM

## 2011-11-25 DIAGNOSIS — I2699 Other pulmonary embolism without acute cor pulmonale: Secondary | ICD-10-CM

## 2011-11-25 DIAGNOSIS — Z7901 Long term (current) use of anticoagulants: Secondary | ICD-10-CM

## 2011-11-25 MED ORDER — WARFARIN SODIUM 6 MG PO TABS: ORAL_TABLET | ORAL | Status: DC

## 2011-12-04 LAB — HM MAMMOGRAPHY

## 2012-01-18 ENCOUNTER — Telehealth: Payer: Self-pay | Admitting: General Practice

## 2012-01-18 NOTE — Telephone Encounter (Signed)
LMOM for pt to call coumadin clinic to re-schedule INR.

## 2012-04-27 ENCOUNTER — Telehealth: Payer: Self-pay | Admitting: General Practice

## 2012-04-30 NOTE — Telephone Encounter (Signed)
Opened in error

## 2012-05-02 ENCOUNTER — Encounter: Payer: Self-pay | Admitting: General Practice

## 2012-05-02 ENCOUNTER — Ambulatory Visit (INDEPENDENT_AMBULATORY_CARE_PROVIDER_SITE_OTHER): Payer: BC Managed Care – PPO | Admitting: General Practice

## 2012-05-02 ENCOUNTER — Other Ambulatory Visit: Payer: Self-pay | Admitting: General Practice

## 2012-05-02 DIAGNOSIS — D6859 Other primary thrombophilia: Secondary | ICD-10-CM

## 2012-05-02 DIAGNOSIS — Z7901 Long term (current) use of anticoagulants: Secondary | ICD-10-CM

## 2012-05-02 DIAGNOSIS — I2699 Other pulmonary embolism without acute cor pulmonale: Secondary | ICD-10-CM

## 2012-05-02 LAB — POCT INR: INR: 3

## 2012-05-03 ENCOUNTER — Encounter: Payer: Self-pay | Admitting: Internal Medicine

## 2012-05-03 ENCOUNTER — Other Ambulatory Visit: Payer: Self-pay | Admitting: General Practice

## 2012-05-03 ENCOUNTER — Ambulatory Visit (INDEPENDENT_AMBULATORY_CARE_PROVIDER_SITE_OTHER): Payer: BC Managed Care – PPO | Admitting: Internal Medicine

## 2012-05-03 VITALS — BP 122/78 | HR 69 | Temp 97.9°F | Ht 68.5 in | Wt 166.6 lb

## 2012-05-03 DIAGNOSIS — Z7901 Long term (current) use of anticoagulants: Secondary | ICD-10-CM

## 2012-05-03 DIAGNOSIS — Z7189 Other specified counseling: Secondary | ICD-10-CM

## 2012-05-03 DIAGNOSIS — Z7689 Persons encountering health services in other specified circumstances: Secondary | ICD-10-CM

## 2012-05-03 MED ORDER — WARFARIN SODIUM 6 MG PO TABS: ORAL_TABLET | ORAL | Status: DC

## 2012-05-03 NOTE — Assessment & Plan Note (Signed)
Continue to follow with coumadin clinic Coumadin refilled today

## 2012-05-03 NOTE — Patient Instructions (Signed)
Warfarin, Questions and Answers Warfarin (Coumadin) is a prescription medicine that delays normal blood clotting. This is called an "anticoagulant" or "blood thinner." This medicine does not actually cause the blood to become less thick, only less likely to clot. Normally, when body tissues are cut or damaged, the blood clots in order to prevent blood loss. Some clots form when they are not supposed to and may travel through the bloodstream and become lodged in smaller blood vessels. Blockage of blood flow can cause serious problems including a stroke (when a blood vessel leading to a portion of the brain is blocked) or a pulmonary embolus (where a blood clot in the leg travels to and lodges in the pulmonary arteries, causing blockage of blood flow to the lungs). WHO SHOULD USE WARFARIN?  Warfarin is prescribed for individuals who have a risk for developing harmful blood clots. People at risk include individuals with a mechanical heart valve, individuals with the irregular heart rhythm called atrial fibrillation, and individuals with certain clotting disorders.  Warfarin is also used in individuals who have a history of developing harmful clots, including individuals who have had a stroke, a clot which has traveled to the lung (pulmonary embolism), or a blood clot in the leg (deep venous thrombosis, DVT).  Warfarin may be used to prevent the growth of an existing clot, such as a DVT. HOW IS THE EFFECT OF WARFARIN MONITORED? The goal of warfarin therapy is to lessen the clotting tendency of blood, but not to prevent clotting completely. Therefore, the anticoagulation effect of warfarin must be carefully watched using laboratory blood tests.  The test most commonly used to measure the effects of warfarin is the prothrombin time (protime, PT).  The PT is also used to compute a value known as the International Normalized Ratio (INR).  The longer it takes the blood to clot, the higher the PT and INR.  Your caregiver will tell you what your "target" INR range is. In most cases, the target range will be 2 to 3, although other ranges may be chosen. If, at any time, your INR is below the target range, there is a risk of clotting. If your INR is above the target range, there is an increased risk of bleeding. When warfarin is first prescribed, a higher "loading" dose may be given so that an effective blood level of the medicine is reached quickly. The loading dose is then adjusted downward until a maintenance dose (a dose which is enough to keep the INR where it should be) is reached. Once you are on a stable maintenance dose, the PT and INR are watched less often, usually once every 2 to 4 weeks. The warfarin dose may be changed at times in response to a changing INR or to medical changes that call for an increase or decrease in warfarin therapy. It is important to keep all laboratory and caregiver follow-up appointments. Not keeping appointments could result in a chronic or permanent injury, pain, disability, and even death. WHAT ARE THE SIDE EFFECTS OF WARFARIN?  Excessive blood thinning can cause bleeding (hemorrhage) from any part of the body. Individuals on warfarin should report any falls or accidents, or any symptoms of bleeding or unusual bruising. Signs of unusual bleeding may include bleeding from the gums, blood in the urine, bloody or dark stool, a nosebleed, coughing up blood, or vomiting blood. Because the risk of bleeding increases as the INR rises above the desired limit, the INR is closely watched during warfarin therapy. Adjustments  are made as needed to keep the INR at an acceptable level (within the target range).  Too little warfarin continues to allow the risk for blood clots.  Other body systems can be affected by warfarin. In addition to any signs of bleeding, patients should report skin rash or irritation, unusual fever, continual nausea or stomach upset, severe pain in the joints or  back, swelling or pain at an injection site, new and severe headaches, or symptoms of a stroke (new weaknesses, change in mental status, visual changes, new dizziness, trouble speaking).  IS WARFARIN SAFE TO USE DURING PREGNANCY?  Warfarin is generally not advised during pregnancy, at least during the first trimester, due to an increased risk of birth defects. A woman who becomes pregnant or plans to become pregnant while on warfarin therapy should notify the caregiver immediately.  Although warfarin does not pass into breast milk, a woman who wishes to breastfeed while taking warfarin should also consult with her caregiver. ARE THERE SPECIAL PRECAUTIONS TO FOLLOW WHILE TAKING WARFARIN? Follow the dosage schedule carefully. Warfarin should be taken exactly as directed. A missed or extra dose requires a call to the prescribing caregiver for advice. Do not change the dose of warfarin on your own to make up for missed or extra doses. It is very important to take warfarin as directed since bleeding or blood clots could result in chronic or permanent injury, pain, or disability. Warfarin tablets come in different strengths. Each tablet strength is usually a different color, with the amount of warfarin (in milligrams) clearly printed on the tablet. You should immediately report to the pharmacist or caregiver any prescription which is different than the previous one, to make sure that you are taking the correct dose. Avoid situations that cause bleeding. You may have a tendency to bleed more easily than usual while taking warfarin. The following changes can limit bleeding:  Using a softer toothbrush.  Flossing with waxed floss rather than unwaxed floss.  Shaving with an Neurosurgeon rather than a blade.  Limiting the use of sharp objects.  Avoiding potentially harmful activities (such as contact sports). Medical conditions. Medical conditions which increase your clotting time  include:  Diarrhea.  Worsening of heart failure.  Fever.  Worsening of liver function. Alcohol use. Alcohol can change the body's ability to handle warfarin. It is best to avoid alcoholic drinks or consume only very small amounts while taking warfarin. Notify your caregiver if you change your alcohol intake. Other medicines. A number of drugs can interact with warfarin. Some of the most common over-the-counter pain relievers (acetaminophen, aspirin, and nonsteroidal anti-inflammatory medicines) make the anticoagulant effects of warfarin stronger. If these medicines are taken, your caregiver may need to adjust the dose of warfarin. The use of many antibiotics can increase the effects of warfarin, and therefore increase bleeding risk. Do not take or discontinue any prescribed or over-the-counter medicine except on the advice of your caregiver or pharmacist.  Dietary considerations.Do not drink herbal teas containing coumarin while taking this medicine. Many foods, especially foods high in vitamin K can interfere with warfarin and affect the PT and INR results. Foods high in vitamin K can cause warfarin to be less effective. You should eat a consistent amount of foods high in vitamin K. The serving size for foods high in vitamin K are  cup cooked and 1 cup raw, unless otherwise noted. These foods include:  Beef liver (3.5 ounces).  Garbanzo beans.  Kale.  Spinach.  Nettle greens.  Swiss chard.  Pork liver (3.5 ounces).  Broccoli.  Cabbage.  Watercress.  Soybeans.  Endive.  Green tea (made with  ounce dried tea).  Brussels sprouts.  Cauliflower.  Parsley (1 Tablespoon).  Collard greens.  Seaweed (limit 2 sheets).  Turnip greens.  Soybean oil.  Green peas. It is not necessary to avoid these foods, but avoid major changes in your diet, or notify your caregiver before changing your diet. Changing your diet to include less foods containing vitamin K may lead to an  excessive warfarin effect. Arrange a visit with a dietitian to answer your questions.  IS IT SAFE TO USE SUPPLEMENTS WHILE TAKING WARFARIN?  Vitamin E. Vitamin E may increase the anticoagulant effects of warfarin. You should consult your caregiver before adding or changing a dose of vitamin E or any other vitamin.  Check other supplements such as multivitamins and calcium supplements. These may contain vitamin K. Document Released: 12/20/2004 Document Revised: 06/21/2011 Document Reviewed: 05/31/2011 [facility] Patient Information 2013 [place], [place].

## 2012-05-03 NOTE — Progress Notes (Signed)
HPI  Pt presents to the clinic today to establish care. She has been coming to the coumadin clinic for INR checks. She was seeing Dr. Sherene Sires to manage her coumadin but she has not seen him in some time. She will be establishing with Dr. Felicity Coyer in the near future. She does have some concerns about coming to the coumadin clinic. Due to the cost of her copays and constant traveling with her job, she is requesting a home INR machine today.  Flu: 2012 Tetanus: 2011 LMP: 04/30/12 Pap smear: 12/2011 Eye doctor: yearly Dentist: biannually Colonoscopy: 2002 -currently scheduled  Past Medical History  Diagnosis Date  . Arthritis   . Migraines   . H/O blood clots   . History of chicken pox     Current Outpatient Prescriptions  Medication Sig Dispense Refill  . buPROPion (WELLBUTRIN XL) 300 MG 24 hr tablet Take 300 mg by mouth daily.      Marland Kitchen warfarin (COUMADIN) 6 MG tablet Take as directed  100 tablet  0   No current facility-administered medications for this visit.    No Known Allergies  Family History  Problem Relation Age of Onset  . Protein S deficiency Mother   . Heart disease Father   . Diabetes Father   . Colon cancer Paternal Grandfather   . Stroke Brother   . Diabetes Paternal Aunt   . Diabetes Paternal Uncle   . Colon cancer Paternal Grandmother     History   Social History  . Marital Status: Married    Spouse Name: N/A    Number of Children: 3  . Years of Education: 16   Occupational History  . Self Employed    Social History Main Topics  . Smoking status: Never Smoker   . Smokeless tobacco: Never Used  . Alcohol Use: No  . Drug Use: No  . Sexually Active: Yes   Other Topics Concern  . Not on file   Social History Narrative   Regular exercise-yes   Caffeine Use-yes    ROS:  Constitutional: Denies fever, malaise, fatigue, headache or abrupt weight changes.  HEENT: Denies eye pain, eye redness, ear pain, ringing in the ears, wax buildup, runny nose,  nasal congestion, bloody nose, or sore throat. Respiratory: Denies difficulty breathing, shortness of breath, cough or sputum production.   Cardiovascular: Denies chest pain, chest tightness, palpitations or swelling in the hands or feet.  Gastrointestinal: Denies abdominal pain, bloating, constipation, diarrhea or blood in the stool.  GU: Denies frequency, urgency, pain with urination, blood in urine, odor or discharge. Musculoskeletal: Pt reports arthritis. Denies decrease in range of motion, difficulty with gait, muscle pain.  Skin: Denies redness, rashes, lesions or ulcercations.  Neurological: Denies dizziness, difficulty with memory, difficulty with speech or problems with balance and coordination.   No other specific complaints in a complete review of systems (except as listed in HPI above).  PE:  BP 122/78  Pulse 69  Temp(Src) 97.9 F (36.6 C) (Oral)  Ht 5' 8.5" (1.74 m)  Wt 166 lb 9.6 oz (75.569 kg)  BMI 24.96 kg/m2  SpO2 97%  LMP 05/01/2012 Wt Readings from Last 3 Encounters:  05/03/12 166 lb 9.6 oz (75.569 kg)    General: Appears her stated age, well developed, well nourished in NAD. HEENT: Head: normal shape and size; Eyes: sclera white, no icterus, conjunctiva pink, PERRLA and EOMs intact; Ears: Tm's gray and intact, normal light reflex; Nose: mucosa pink and moist, septum midline; Throat/Mouth: Teeth present,  mucosa pink and moist, no lesions or ulcerations noted.  Neck: Normal range of motion. Neck supple, trachea midline. No massses, lumps or thyromegaly present.  Cardiovascular: Normal rate and rhythm. S1,S2 noted.  No murmur, rubs or gallops noted. No JVD or BLE edema. No carotid bruits noted. Pulmonary/Chest: Normal effort and positive vesicular breath sounds. No respiratory distress. No wheezes, rales or ronchi noted.  Abdomen: Soft and nontender. Normal bowel sounds, no bruits noted. No distention or masses noted. Liver, spleen and kidneys non  palpable. Musculoskeletal: Normal range of motion.  joint swelling noted in right pointer finger. No difficulty with gait.  Neurological: Alert and oriented. Cranial nerves II-XII intact. Coordination normal. +DTRs bilaterally. Psychiatric: Mood and affect normal. Behavior is normal. Judgment and thought content normal.      Assessment and Plan:  Establish care:  Will obtain labs from previous providers All HM UTD

## 2012-07-13 ENCOUNTER — Ambulatory Visit (INDEPENDENT_AMBULATORY_CARE_PROVIDER_SITE_OTHER): Payer: BC Managed Care – PPO | Admitting: General Practice

## 2012-07-13 DIAGNOSIS — Z7901 Long term (current) use of anticoagulants: Secondary | ICD-10-CM

## 2012-07-13 DIAGNOSIS — D6859 Other primary thrombophilia: Secondary | ICD-10-CM

## 2012-07-13 DIAGNOSIS — I2699 Other pulmonary embolism without acute cor pulmonale: Secondary | ICD-10-CM

## 2012-07-13 LAB — POCT INR: INR: 2.7

## 2012-08-15 ENCOUNTER — Other Ambulatory Visit: Payer: Self-pay | Admitting: Internal Medicine

## 2012-09-05 ENCOUNTER — Ambulatory Visit (INDEPENDENT_AMBULATORY_CARE_PROVIDER_SITE_OTHER): Payer: BC Managed Care – PPO | Admitting: General Practice

## 2012-09-05 DIAGNOSIS — D6859 Other primary thrombophilia: Secondary | ICD-10-CM

## 2012-09-05 DIAGNOSIS — I2699 Other pulmonary embolism without acute cor pulmonale: Secondary | ICD-10-CM

## 2012-09-05 DIAGNOSIS — Z7901 Long term (current) use of anticoagulants: Secondary | ICD-10-CM

## 2012-09-05 LAB — POCT INR: INR: 1.5

## 2012-09-19 ENCOUNTER — Ambulatory Visit (INDEPENDENT_AMBULATORY_CARE_PROVIDER_SITE_OTHER): Payer: BC Managed Care – PPO | Admitting: General Practice

## 2012-09-19 DIAGNOSIS — I2699 Other pulmonary embolism without acute cor pulmonale: Secondary | ICD-10-CM

## 2012-09-19 DIAGNOSIS — D6859 Other primary thrombophilia: Secondary | ICD-10-CM

## 2012-09-19 DIAGNOSIS — Z7901 Long term (current) use of anticoagulants: Secondary | ICD-10-CM

## 2012-11-09 ENCOUNTER — Other Ambulatory Visit: Payer: Self-pay | Admitting: Internal Medicine

## 2012-11-14 ENCOUNTER — Ambulatory Visit (INDEPENDENT_AMBULATORY_CARE_PROVIDER_SITE_OTHER): Payer: BC Managed Care – PPO | Admitting: General Practice

## 2012-11-14 DIAGNOSIS — D6859 Other primary thrombophilia: Secondary | ICD-10-CM

## 2012-11-14 DIAGNOSIS — Z7901 Long term (current) use of anticoagulants: Secondary | ICD-10-CM

## 2012-11-14 DIAGNOSIS — I2699 Other pulmonary embolism without acute cor pulmonale: Secondary | ICD-10-CM

## 2012-11-14 NOTE — Progress Notes (Signed)
Pre-visit discussion using our clinic review tool. No additional management support is needed unless otherwise documented below in the visit note.  

## 2013-01-03 LAB — HM COLONOSCOPY

## 2013-01-09 ENCOUNTER — Ambulatory Visit (INDEPENDENT_AMBULATORY_CARE_PROVIDER_SITE_OTHER): Payer: BC Managed Care – PPO | Admitting: General Practice

## 2013-01-09 DIAGNOSIS — D6859 Other primary thrombophilia: Secondary | ICD-10-CM

## 2013-01-09 DIAGNOSIS — I2699 Other pulmonary embolism without acute cor pulmonale: Secondary | ICD-10-CM

## 2013-01-09 DIAGNOSIS — Z7901 Long term (current) use of anticoagulants: Secondary | ICD-10-CM

## 2013-01-09 LAB — POCT INR: INR: 3.5

## 2013-01-09 NOTE — Progress Notes (Signed)
Pre-visit discussion using our clinic review tool. No additional management support is needed unless otherwise documented below in the visit note.  

## 2013-01-21 ENCOUNTER — Telehealth: Payer: Self-pay | Admitting: General Practice

## 2013-01-21 NOTE — Telephone Encounter (Signed)
Message copied by Garrison ColumbusBOYD, Amana Bouska D on Mon Jan 21, 2013 11:45 AM ------      Message from: Lorre MunroeBAITY, REGINA W      Created: Mon Jan 21, 2013 11:40 AM       I remember her. Ok to go ahead and do lovenox bridge.      ----- Message -----         From: Garrison Columbusynthia D Dyson Sevey, RN         Sent: 01/21/2013  11:09 AM           To: Nicki Reaperegina Baity, NP            Creed CopperHey Regina,            This patient says she is having a colonoscopy in February and needs  Lovenox bridge.  Can you OK that?  She does have a hypercoagulable condition..I believe factor 5.  She had established with you, a patient of Dr. Sherene SiresWert but hasn't seen him in years.   She is the patient that wanted a coag machine...remember?            Cindy       ------

## 2013-01-29 ENCOUNTER — Other Ambulatory Visit: Payer: Self-pay | Admitting: Internal Medicine

## 2013-01-29 DIAGNOSIS — Z7901 Long term (current) use of anticoagulants: Secondary | ICD-10-CM

## 2013-01-30 ENCOUNTER — Telehealth: Payer: Self-pay | Admitting: General Practice

## 2013-01-30 NOTE — Telephone Encounter (Signed)
LMOVM - Please call coumadin clinic asap in regards to Lovenox for upcoming procedure.

## 2013-01-30 NOTE — Telephone Encounter (Signed)
Instructions for coumadin pre and post procedure.  1/20 - Take last dose of coumadin  2/5 - Resume coumadin and take 9 mg x 2 days and then continue taking 6mg  daily.   Patient verbalizes understanding.

## 2013-01-30 NOTE — Telephone Encounter (Signed)
LMOVM - Please call coumadin clinic asap in regards to Lovenox for upcoming procedure. 

## 2013-01-31 ENCOUNTER — Telehealth: Payer: Self-pay | Admitting: General Practice

## 2013-01-31 NOTE — Telephone Encounter (Signed)
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  Do you recommend a Lovenox bridge for patient?            Thanks,      Bailey Mechindy Ygnacio Fecteau, RN      [facility] - Coumadin Clinc       ------

## 2013-03-20 ENCOUNTER — Ambulatory Visit (INDEPENDENT_AMBULATORY_CARE_PROVIDER_SITE_OTHER): Payer: BC Managed Care – PPO | Admitting: General Practice

## 2013-03-20 DIAGNOSIS — Z7901 Long term (current) use of anticoagulants: Secondary | ICD-10-CM

## 2013-03-20 DIAGNOSIS — D6859 Other primary thrombophilia: Secondary | ICD-10-CM

## 2013-03-20 DIAGNOSIS — I2699 Other pulmonary embolism without acute cor pulmonale: Secondary | ICD-10-CM

## 2013-03-20 DIAGNOSIS — Z5181 Encounter for therapeutic drug level monitoring: Secondary | ICD-10-CM | POA: Insufficient documentation

## 2013-03-20 LAB — POCT INR: INR: 2.5

## 2013-03-20 NOTE — Progress Notes (Signed)
Pre visit review using our clinic review tool, if applicable. No additional management support is needed unless otherwise documented below in the visit note. 

## 2013-06-06 ENCOUNTER — Other Ambulatory Visit: Payer: Self-pay | Admitting: Internal Medicine

## 2013-06-06 NOTE — Telephone Encounter (Signed)
Last filled 11/14 #100--however pt did not make her last PTINR appt that was supposed to be for 05/17/13 and last OV was 5/14--please advise

## 2013-06-07 ENCOUNTER — Other Ambulatory Visit: Payer: Self-pay | Admitting: General Practice

## 2013-06-07 MED ORDER — WARFARIN SODIUM 6 MG PO TABS: ORAL_TABLET | ORAL | Status: DC

## 2013-06-07 NOTE — Telephone Encounter (Signed)
Left detailed msg on VM per HIPAA letting pt know she needed to make a f/u appt and 30 pills of warfarin can be sent into local pharmacy of her choice

## 2013-06-07 NOTE — Telephone Encounter (Signed)
Give 30 pills, she will need to followup before further refills given

## 2013-06-10 NOTE — Telephone Encounter (Signed)
Pt states she spoke to [place] with the coumadin clinic and she has refilled the Rx--however i did not see a f/u visit with Nicki Reaper just Coumadin

## 2013-06-12 ENCOUNTER — Other Ambulatory Visit: Payer: Self-pay | Admitting: General Practice

## 2013-06-12 ENCOUNTER — Ambulatory Visit (INDEPENDENT_AMBULATORY_CARE_PROVIDER_SITE_OTHER): Payer: BC Managed Care – PPO | Admitting: General Practice

## 2013-06-12 DIAGNOSIS — Z5181 Encounter for therapeutic drug level monitoring: Secondary | ICD-10-CM

## 2013-06-12 DIAGNOSIS — D6859 Other primary thrombophilia: Secondary | ICD-10-CM

## 2013-06-12 DIAGNOSIS — I2699 Other pulmonary embolism without acute cor pulmonale: Secondary | ICD-10-CM

## 2013-06-12 LAB — POCT INR: INR: 1.3

## 2013-06-12 MED ORDER — WARFARIN SODIUM 6 MG PO TABS: 6.0000 mg | ORAL_TABLET | Freq: Every day | ORAL | Status: DC

## 2013-06-12 MED ORDER — WARFARIN SODIUM 6 MG PO TABS: ORAL_TABLET | ORAL | Status: DC

## 2013-06-12 NOTE — Progress Notes (Signed)
Pre visit review using our clinic review tool, if applicable. No additional management support is needed unless otherwise documented below in the visit note. 

## 2013-08-27 ENCOUNTER — Ambulatory Visit (INDEPENDENT_AMBULATORY_CARE_PROVIDER_SITE_OTHER): Payer: BC Managed Care – PPO | Admitting: *Deleted

## 2013-08-27 DIAGNOSIS — Z5181 Encounter for therapeutic drug level monitoring: Secondary | ICD-10-CM

## 2013-08-27 DIAGNOSIS — I2699 Other pulmonary embolism without acute cor pulmonale: Secondary | ICD-10-CM

## 2013-08-27 DIAGNOSIS — D6859 Other primary thrombophilia: Secondary | ICD-10-CM

## 2013-08-27 LAB — POCT INR: INR: 2

## 2013-09-21 ENCOUNTER — Other Ambulatory Visit: Payer: Self-pay | Admitting: Internal Medicine

## 2013-10-09 ENCOUNTER — Ambulatory Visit (INDEPENDENT_AMBULATORY_CARE_PROVIDER_SITE_OTHER): Payer: BC Managed Care – PPO | Admitting: Internal Medicine

## 2013-10-09 ENCOUNTER — Encounter: Payer: Self-pay | Admitting: Internal Medicine

## 2013-10-09 VITALS — BP 118/78 | HR 69 | Temp 98.3°F | Resp 16 | Ht 69.0 in | Wt 172.8 lb

## 2013-10-09 DIAGNOSIS — M1990 Unspecified osteoarthritis, unspecified site: Secondary | ICD-10-CM

## 2013-10-09 DIAGNOSIS — D6859 Other primary thrombophilia: Secondary | ICD-10-CM

## 2013-10-09 DIAGNOSIS — Z01818 Encounter for other preprocedural examination: Secondary | ICD-10-CM | POA: Insufficient documentation

## 2013-10-09 DIAGNOSIS — Z23 Encounter for immunization: Secondary | ICD-10-CM

## 2013-10-09 DIAGNOSIS — I2699 Other pulmonary embolism without acute cor pulmonale: Secondary | ICD-10-CM

## 2013-10-09 DIAGNOSIS — Z0000 Encounter for general adult medical examination without abnormal findings: Secondary | ICD-10-CM

## 2013-10-09 MED ORDER — DICLOFENAC SODIUM 1 % TD GEL: 2.0000 g | Freq: Three times a day (TID) | TRANSDERMAL | Status: DC | PRN

## 2013-10-09 MED ORDER — TRIAMCINOLONE ACETONIDE 0.1 % EX CREA: 1.0000 "application " | TOPICAL_CREAM | Freq: Two times a day (BID) | CUTANEOUS | Status: DC

## 2013-10-09 MED ORDER — RIVAROXABAN 20 MG PO TABS: 20.0000 mg | ORAL_TABLET | Freq: Every day | ORAL | Status: DC

## 2013-10-09 NOTE — Assessment & Plan Note (Signed)
Unclear if she trully has hypercoagulable state. There is family history of blood clots and PE after childbirth. At this time benefit of anticoagulation outweighs risk. Talked to her today about noacs and given her information. She will consider change to xarelto for convenience of no INR checks. She would like to read about it and then talk to her cardiologist about it.

## 2013-10-09 NOTE — Progress Notes (Signed)
Pre visit review using our clinic review tool, if applicable. No additional management support is needed unless otherwise documented below in the visit note. 

## 2013-10-09 NOTE — Assessment & Plan Note (Signed)
See above

## 2013-10-09 NOTE — Assessment & Plan Note (Signed)
Voltaren gel prescribed for her knee arthritis.

## 2013-10-09 NOTE — Progress Notes (Signed)
   Subjective:    Patient ID: Melinda Benton, female    DOB: May 10, 1960, 53 y.o.   MRN: [id number]  HPI The patient is a 53 YO female who is coming in today to establish care. 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 She is thought to have hypercoagulable state. She had PE after her child was born and has been on coumadin ever since that time. She finds INR checks to be inconvenient and a drain. She does some traveling for work and is not able to make it sometimes. She has history of some abnormal heart rhythmn and that is returing so she is going to see her cardiologist later this month about possible second ablation. She denies chest pains, SOB, abdominal pain. She is having some arthritis in her knee especially as well as her fingers and uses voltaren gel on them with good success. She is going through menopause right now and is disappointed with some recent weight gain despite good amounts of rigorous physical activity.   Review of Systems  Constitutional: Negative for fever, activity change, appetite change and fatigue.  HENT: Negative.   Eyes: Negative.   Respiratory: Negative for cough, chest tightness, shortness of breath and wheezing.   Cardiovascular: Negative for chest pain, palpitations and leg swelling.  Gastrointestinal: Negative for abdominal pain, diarrhea, constipation and abdominal distention.  Musculoskeletal: Positive for arthralgias. Negative for gait problem and myalgias.  Skin: Negative.   Neurological: Negative for dizziness, weakness, light-headedness and headaches.  Psychiatric/Behavioral: Negative.       Objective:   Physical Exam  Constitutional: She is oriented to person, place, and time. She appears well-developed and well-nourished.  HENT:  Head: Normocephalic and atraumatic.  Eyes: EOM are normal.  Neck: Normal range of motion.  Cardiovascular: Normal rate and regular rhythm.   Pulmonary/Chest: Effort normal and breath sounds normal. No  respiratory distress. She has no wheezes. She has no rales.  Abdominal: Soft. Bowel sounds are normal. She exhibits no distension. There is no tenderness. There is no rebound.  Neurological: She is alert and oriented to person, place, and time. Coordination normal.  Skin: Skin is warm and dry. Rash noted.  Rash on the arm (right)      Assessment & Plan:  Triamcinolone cream for itchy rash on her arm, likely allergic dermatitis. Flu shot given today.

## 2013-10-09 NOTE — Patient Instructions (Addendum)
We will think about switching you from coumadin (warfarin) to xarelto (rivaroxaban) for your prevention of DVT or PE. It is once a day and does not require monitoring with INR checks. It is a fixed dose. I will give you information about it that you can look over and talk to the cardiologist about. Come back in about 1 year for a check up or sooner if you want to talk about the blood thinning medicine more.   Rivaroxaban oral tablets What is this medicine? RIVAROXABAN (ri va ROX a ban) is an anticoagulant (blood thinner). It is used to treat blood clots in the lungs or in the veins. It is also used after knee or hip surgeries to prevent blood clots. It is also used to lower the chance of stroke in people with a medical condition called atrial fibrillation. This medicine may be used for other purposes; ask your health care provider or pharmacist if you have questions. COMMON BRAND NAME(S): Xarelto, Xarelto Starter Pack What should I tell my health care provider before I take this medicine? They need to know if you have any of these conditions: -bleeding disorders -bleeding in the brain -blood in your stools (black or tarry stools) or if you have blood in your vomit -history of stomach bleeding -kidney disease -liver disease -low blood counts, like low white cell, platelet, or red cell counts -recent or planned spinal or epidural procedure -take medicines that treat or prevent blood clots -an unusual or allergic reaction to rivaroxaban, other medicines, foods, dyes, or preservatives -pregnant or trying to get pregnant -breast-feeding How should I use this medicine? Take this medicine by mouth with a glass of water. Follow the directions on the prescription label. Take your medicine at regular intervals. Do not take it more often than directed. Do not stop taking except on your doctor's advice. Stopping this medicine may increase your risk of a blot clot. Be sure to refill your prescription before  you run out of medicine. If you are taking this medicine after hip or knee replacement surgery, take it with or without food. If you are taking this medicine for atrial fibrillation, take it with your evening meal. If you are taking this medicine to treat blood clots, take it with food at the same time each day. If you are unable to swallow your tablet, you may crush the tablet and mix it in applesauce. Then, immediately eat the applesauce. You should eat more food right after you eat the applesauce containing the crushed tablet. Talk to your pediatrician regarding the use of this medicine in children. Special care may be needed. Overdosage: If you think you have taken too much of this medicine contact a poison control center or emergency room at once. NOTE: This medicine is only for you. Do not share this medicine with others. What if I miss a dose? If you take your medicine once a day and miss a dose, take the missed dose as soon as you remember. If you take your medicine twice a day and miss a dose, take the missed dose immediately. In this instance, 2 tablets may be taken at the same time. The next day you should take 1 tablet twice a day as directed. What may interact with this medicine? -aspirin and aspirin-like medicines -certain antibiotics like erythromycin, azithromycin, and clarithromycin -certain medicines for fungal infections like ketoconazole and itraconazole -certain medicines for irregular heart beat like amiodarone, quinidine, dronedarone -certain medicines for seizures like carbamazepine, phenytoin -certain medicines that  treat or prevent blood clots like warfarin, enoxaparin, and dalteparin -conivaptan -diltiazem -felodipine -indinavir -lopinavir; ritonavir -NSAIDS, medicines for pain and inflammation, like ibuprofen or naproxen -ranolazine -rifampin -ritonavir -St. John's wort -verapamil This list may not describe all possible interactions. Give your health care provider  a list of all the medicines, herbs, non-prescription drugs, or dietary supplements you use. Also tell them if you smoke, drink alcohol, or use illegal drugs. Some items may interact with your medicine. What should I watch for while using this medicine? Visit your doctor or health care professional for regular checks on your progress. Your condition will be monitored carefully while you are receiving this medicine. Notify your doctor or health care professional and seek emergency treatment if you develop breathing problems; changes in vision; chest pain; severe, sudden headache; pain, swelling, warmth in the leg; trouble speaking; sudden numbness or weakness of the face, arm, or leg. These can be signs that your condition has gotten worse. If you are going to have surgery, tell your doctor or health care professional that you are taking this medicine. Tell your health care professional that you use this medicine before you have a spinal or epidural procedure. Sometimes people who take this medicine have bleeding problems around the spine when they have a spinal or epidural procedure. This bleeding is very rare. If you have a spinal or epidural procedure while on this medicine, call your health care professional immediately if you have back pain, numbness or tingling (especially in your legs and feet), muscle weakness, paralysis, or loss of bladder or bowel control. Avoid sports and activities that might cause injury while you are using this medicine. Severe falls or injuries can cause unseen bleeding. Be careful when using sharp tools or knives. Consider using an Neurosurgeonelectric razor. Take special care brushing or flossing your teeth. Report any injuries, bruising, or red spots on the skin to your doctor or health care professional. What side effects may I notice from receiving this medicine? Side effects that you should report to your doctor or health care professional as soon as possible: -allergic reactions  like skin rash, itching or hives, swelling of the face, lips, or tongue -back pain -redness, blistering, peeling or loosening of the skin, including inside the mouth -signs and symptoms of bleeding such as bloody or black, tarry stools; red or dark-brown urine; spitting up blood or brown material that looks like coffee grounds; red spots on the skin; unusual bruising or bleeding from the eye, gums, or nose Side effects that usually do not require medical attention (Report these to your doctor or health care professional if they continue or are bothersome.): -dizziness -muscle pain This list may not describe all possible side effects. Call your doctor for medical advice about side effects. You may report side effects to FDA at 1-800-FDA-1088. Where should I keep my medicine? Keep out of the reach of children. Store at room temperature between 15 and 30 degrees C (59 and 86 degrees F). Throw away any unused medicine after the expiration date. NOTE: This sheet is a summary. It may not cover all possible information. If you have questions about this medicine, talk to your doctor, pharmacist, or health care provider.  2015, [employer]vier/Gold Standard. (2013-04-11 18:47:48)

## 2013-10-09 NOTE — Assessment & Plan Note (Signed)
Will check labs at next visit as she did not have time today. Last reviewed and normal. She had colonoscopy at [facility] earlier this year and will try to get records. Flu shot given today.

## 2013-10-29 ENCOUNTER — Ambulatory Visit (INDEPENDENT_AMBULATORY_CARE_PROVIDER_SITE_OTHER): Payer: BC Managed Care – PPO | Admitting: Internal Medicine

## 2013-10-29 ENCOUNTER — Ambulatory Visit (INDEPENDENT_AMBULATORY_CARE_PROVIDER_SITE_OTHER): Payer: BC Managed Care – PPO | Admitting: *Deleted

## 2013-10-29 ENCOUNTER — Encounter: Payer: Self-pay | Admitting: Internal Medicine

## 2013-10-29 VITALS — BP 106/72 | HR 72 | Ht 69.0 in | Wt 173.2 lb

## 2013-10-29 DIAGNOSIS — R002 Palpitations: Secondary | ICD-10-CM

## 2013-10-29 DIAGNOSIS — D6859 Other primary thrombophilia: Secondary | ICD-10-CM

## 2013-10-29 DIAGNOSIS — D6852 Prothrombin gene mutation: Secondary | ICD-10-CM

## 2013-10-29 DIAGNOSIS — Z5181 Encounter for therapeutic drug level monitoring: Secondary | ICD-10-CM

## 2013-10-29 DIAGNOSIS — I2699 Other pulmonary embolism without acute cor pulmonale: Secondary | ICD-10-CM

## 2013-10-29 LAB — POCT INR: INR: 2.5

## 2013-10-29 NOTE — Assessment & Plan Note (Signed)
We discussed anticoagulation in this setting of chronic Coumadin therapy and the possibility of switching to a novel oral agent. The patient does have a family history of protein S. deficiency. It is unclear to me whether this would preclude the use of a novel oral agent. Otherwise switching from warfarin to an oral agent would be very reasonable.

## 2013-10-29 NOTE — Patient Instructions (Signed)
Your physician has requested that you have an echocardiogram. Echocardiography is a painless test that uses sound waves to create images of your heart. It provides your doctor with information about the size and shape of your heart and how well your heart's chambers and valves are working. This procedure takes approximately one hour. There are no restrictions for this procedure.  Your physician has recommended that you wear a 48 hour holter monitor. Holter monitors are medical devices that record the heart's electrical activity. Doctors most often use these monitors to diagnose arrhythmias. Arrhythmias are problems with the speed or rhythm of the heartbeat. The monitor is a small, portable device. You can wear one while you do your normal daily activities. This is usually used to diagnose what is causing palpitations/syncope (passing out).  Your physician recommends that you schedule a follow-up appointment in: 1 month with Dr. Ladona Ridgelaylor.

## 2013-10-29 NOTE — Progress Notes (Signed)
HPI Melinda Benton returns today after a nearly 10 year absence from our arrhythmia clinic. 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 Prior that, the patient had been diagnosed with recurrent pulmonary emboli and has been on chronic Coumadin therapy for over 20 years. She is recently consider switching to one of our novel oral anticoagulants. Over the last several months, she notes palpitations. Her symptoms did not quite feel like they did when she had SVT. She states that it feels like it's difficult for her heart to maintain normal rhythm and feels like her heart skips. These episodes occur daily. They seem to be worse with exertion although the episodes can occur anytime. She also notes very mild peripheral edema. She has very mild dyspnea with exertion. No chest pressure or chest pain. She has not had syncope. No Known Allergies   Current Outpatient Prescriptions  Medication Sig Dispense Refill  . buPROPion (WELLBUTRIN XL) 300 MG 24 hr tablet Take 300 mg by mouth daily.      . diclofenac sodium (VOLTAREN) 1 % GEL Apply 2 g topically 3 (three) times daily as needed (pain).  100 g  6  . warfarin (COUMADIN) 6 MG tablet TAKE AS DIRECTED BY ANTICOAGULATION CLINIC.  105 tablet  0  . rivaroxaban (XARELTO) 20 MG TABS tablet Take 1 tablet (20 mg total) by mouth daily with supper.  30 tablet  6  . triamcinolone cream (KENALOG) 0.1 % Apply 1 application topically 2 (two) times daily.  45 g  2   No current facility-administered medications for this visit.     Past Medical History  Diagnosis Date  . Arthritis   . Migraines   . H/O blood clots   . History of chicken pox     ROS:   All systems reviewed and negative except as noted in the HPI.   Past Surgical History  Procedure Laterality Date  . Tonsillectomy  ?1968  . Rotator cuff repair  2011  . Elbow surgery  2009     Family  History  Problem Relation Age of Onset  . Protein S deficiency Mother   . Heart disease Father   . Diabetes Father   . Colon cancer Paternal Grandfather   . Stroke Brother   . Diabetes Paternal Aunt   . Diabetes Paternal Uncle   . Colon cancer Paternal Grandmother      History   Social History  . Marital Status: Married    Spouse Name: N/A    Number of Children: 3  . Years of Education: 16   Occupational History  . Self Employed    Social History Main Topics  . Smoking status: Never Smoker   . Smokeless tobacco: Never Used  . Alcohol Use: No  . Drug Use: No  . Sexual Activity: Yes   Other Topics Concern  . Not on file   Social History Narrative   Regular exercise-yes   Caffeine Use-yes     BP 106/72  Pulse 72  Ht 5\' 9"  (1.753 m)  Wt 173 lb 3.2 oz (78.563 kg)  BMI 25.57 kg/m2  Physical Exam:  Well appearing middle-aged woman, NAD HEENT: Unremarkable Neck:  No JVD, no thyromegally Back:  No CVA tenderness Lungs:  Clear with no wheezes, rales, or rhonchi. HEART:  Regular rate rhythm, no murmurs, no rubs, no clicks Abd:  soft, positive bowel sounds,  no organomegally, no rebound, no guarding Ext:  2 plus pulses, no edema, no cyanosis, no clubbing Skin:  No rashes no nodules Neuro:  CN II through XII intact, motor grossly intact  EKG - normal sinus rhythm with frequent PVCs and a rightward axis.   Assess/Plan:

## 2013-10-29 NOTE — Assessment & Plan Note (Signed)
The etiology of her symptoms is unclear. I doubt she has recurrent SVT, and note frequent PVCs which appear to be originating from the right ventricular outflow tract on her ECG. She could also have atrial fibrillation, particularly in light of her right axis deviation and history of pulmonary emboli. I've recommended that she undergo 2-D echo, as the patient also notices peripheral edema, as well as a 48 hour Holter monitor. Additional followup and treatment will be recommended based on the results of the above studies.

## 2013-11-14 ENCOUNTER — Ambulatory Visit (HOSPITAL_COMMUNITY): Payer: BC Managed Care – PPO | Attending: Internal Medicine

## 2013-11-14 ENCOUNTER — Encounter (INDEPENDENT_AMBULATORY_CARE_PROVIDER_SITE_OTHER): Payer: BC Managed Care – PPO

## 2013-11-14 ENCOUNTER — Other Ambulatory Visit (HOSPITAL_COMMUNITY): Payer: BC Managed Care – PPO

## 2013-11-14 ENCOUNTER — Encounter: Payer: Self-pay | Admitting: *Deleted

## 2013-11-14 DIAGNOSIS — R002 Palpitations: Secondary | ICD-10-CM

## 2013-11-14 DIAGNOSIS — I2699 Other pulmonary embolism without acute cor pulmonale: Secondary | ICD-10-CM | POA: Insufficient documentation

## 2013-11-14 NOTE — Progress Notes (Signed)
Patient ID: Melinda Benton, female   DOB: [birth year], 53 y.o.   MRN: [id number] [employer] 48 hour holter monitor applied to patient.

## 2013-11-14 NOTE — Progress Notes (Signed)
2D Echo completed. 11/14/2013 

## 2013-12-11 ENCOUNTER — Ambulatory Visit (INDEPENDENT_AMBULATORY_CARE_PROVIDER_SITE_OTHER): Payer: BC Managed Care – PPO | Admitting: Internal Medicine

## 2013-12-11 ENCOUNTER — Encounter: Payer: Self-pay | Admitting: Internal Medicine

## 2013-12-11 VITALS — BP 110/78 | HR 75 | Ht 68.5 in | Wt 170.4 lb

## 2013-12-11 DIAGNOSIS — I2699 Other pulmonary embolism without acute cor pulmonale: Secondary | ICD-10-CM

## 2013-12-11 DIAGNOSIS — R002 Palpitations: Secondary | ICD-10-CM

## 2013-12-11 DIAGNOSIS — D6859 Other primary thrombophilia: Secondary | ICD-10-CM

## 2013-12-11 NOTE — Progress Notes (Signed)
HPI [name] returns today for ongoing followup of palpitations. 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 Prior that, the patient had been diagnosed with recurrent pulmonary emboli and has been on chronic Coumadin therapy for over 20 years. She has been known to have a protein S deficiency. She is recently consider switching to one of our novel oral anticoagulants. Over the last several months, she notes palpitations. She saw me several weeks ago, and I had her wear a cardiac monitor for 48 hours. The monitor demonstrated frequent PACs, PVCs, as well as brief bursts of atrial tachycardia lasting seconds at rates of between 140 and 150 bpm. In addition, the patient had a 2-D echo which demonstrated normal left ventricular systolic function, normal right ventricular systolic function and no atrial or ventricular dilatation. Her pulmonary pressures were not elevated. Otherwise the patient has been stable and continues to exercise regularly. She denies syncope or chest pain. No Known Allergies   Current Outpatient Prescriptions  Medication Sig Dispense Refill  . buPROPion (WELLBUTRIN XL) 300 MG 24 hr tablet Take 300 mg by mouth daily.    . diclofenac sodium (VOLTAREN) 1 % GEL Apply 2 g topically 3 (three) times daily as needed (pain). 100 g 6  . triamcinolone cream (KENALOG) 0.1 % Apply 1 application topically 2 (two) times daily. (Patient taking differently: Apply 1 application topically 2 (two) times daily as needed (poison ivy). ) 45 g 2  . warfarin (COUMADIN) 6 MG tablet TAKE AS DIRECTED BY ANTICOAGULATION CLINIC. 105 tablet 0  . amoxicillin (AMOXIL) 500 MG capsule Take 4 tablets by mouth one hour before dental procedures  0  . rivaroxaban (XARELTO) 20 MG TABS tablet Take 1 tablet (20 mg total) by mouth daily with supper. (Patient not taking: Reported on 12/11/2013) 30 tablet 6   No  current facility-administered medications for this visit.     Past Medical History  Diagnosis Date  . Arthritis   . Migraines   . H/O blood clots   . History of chicken pox     ROS:   All systems reviewed and negative except as noted in the HPI.   Past Surgical History  Procedure Laterality Date  . Tonsillectomy  ?1968  . Rotator cuff repair  2011  . Elbow surgery  2009     Family History  Problem Relation Age of Onset  . Protein S deficiency Mother   . Heart disease Father   . Diabetes Father   . Colon cancer Paternal Grandfather   . Stroke Brother   . Diabetes Paternal Aunt   . Diabetes Paternal Uncle   . Colon cancer Paternal Grandmother      History   Social History  . Marital Status: Married    Spouse Name: N/A    Number of Children: 3  . Years of Education: 16   Occupational History  . Self Employed    Social History Main Topics  . Smoking status: Never Smoker   . Smokeless tobacco: Never Used  . Alcohol Use: No  . Drug Use: No  . Sexual Activity: Yes   Other Topics Concern  . Not on file   Social History Narrative   Regular exercise-yes   Caffeine Use-yes     BP 110/78 mmHg  Pulse 75  Ht 5' 8.5" (1.74 m)  Wt 170 lb 6.4 oz (77.293 kg)  BMI 25.53 kg/m2  Physical Exam:  Well appearing middle-aged woman, NAD HEENT: Unremarkable Neck:  No JVD, no thyromegally Back:  No CVA tenderness Lungs:  Clear with no wheezes, rales, or rhonchi. HEART:  Regular rate rhythm, no murmurs, no rubs, no clicks Abd:  soft, positive bowel sounds, no organomegally, no rebound, no guarding Ext:  2 plus pulses, no edema, no cyanosis, no clubbing Skin:  No rashes no nodules Neuro:  CN II through XII intact, motor grossly intact     Assess/Plan:

## 2013-12-12 NOTE — Assessment & Plan Note (Signed)
The etiology of her palpitations appears to be due to PACs, PVCs, and nonsustained atrial tachycardia. We discussed the benign nature of these arrhythmias. We discussed possible medical options for suppression of her arrhythmias. After considerable reflection, the patient prefers to not use any antiarrhythmic drugs and undergo watchful waiting.

## 2013-12-12 NOTE — Assessment & Plan Note (Signed)
She has had no recurrent symptoms and will remain on systemic anticoagulation. We discussed the possibility of switching anticoagulant medications. For now, she would like to continue warfarin. She will not take Xarelto.

## 2013-12-12 NOTE — Assessment & Plan Note (Signed)
We had a discussion today about her hypercoagulable state and protein S deficiency, particularly as it pertains to her children. 2 of her daughters have also been found to have protein S deficiency. Her son has not been tested. She understands that they may also be at risk for deep vein thrombosis and pulmonary embolism.

## 2014-02-04 ENCOUNTER — Ambulatory Visit (INDEPENDENT_AMBULATORY_CARE_PROVIDER_SITE_OTHER): Payer: BLUE CROSS/BLUE SHIELD | Admitting: General Practice

## 2014-02-04 ENCOUNTER — Telehealth: Payer: Self-pay | Admitting: Family

## 2014-02-04 DIAGNOSIS — Z5181 Encounter for therapeutic drug level monitoring: Secondary | ICD-10-CM

## 2014-02-04 DIAGNOSIS — D6859 Other primary thrombophilia: Secondary | ICD-10-CM

## 2014-02-04 DIAGNOSIS — I2699 Other pulmonary embolism without acute cor pulmonale: Secondary | ICD-10-CM

## 2014-02-04 LAB — POCT INR: INR: 1.7

## 2014-02-04 NOTE — Progress Notes (Signed)
Pre visit review using our clinic review tool, if applicable. No additional management support is needed unless otherwise documented below in the visit note. 

## 2014-02-04 NOTE — Telephone Encounter (Signed)
Please have the patient return in 2 weeks since today's value is low.

## 2014-02-04 NOTE — Patient Instructions (Addendum)
Patient requests to come every 8 weeks.

## 2014-02-10 ENCOUNTER — Telehealth: Payer: Self-pay | Admitting: Internal Medicine

## 2014-02-10 ENCOUNTER — Other Ambulatory Visit: Payer: Self-pay | Admitting: Geriatric Medicine

## 2014-02-10 MED ORDER — WARFARIN SODIUM 6 MG PO TABS: ORAL_TABLET | ORAL | Status: DC

## 2014-02-10 NOTE — Telephone Encounter (Signed)
Pt request refill for warfarin (COUMADIN) 6 MG tablet [facility]. Please used invoice # [id number]

## 2014-02-10 NOTE — Telephone Encounter (Signed)
Sent to pharmacy 

## 2014-04-08 ENCOUNTER — Ambulatory Visit (INDEPENDENT_AMBULATORY_CARE_PROVIDER_SITE_OTHER): Payer: BLUE CROSS/BLUE SHIELD | Admitting: General Practice

## 2014-04-08 DIAGNOSIS — D6859 Other primary thrombophilia: Secondary | ICD-10-CM

## 2014-04-08 DIAGNOSIS — I2699 Other pulmonary embolism without acute cor pulmonale: Secondary | ICD-10-CM | POA: Diagnosis not present

## 2014-04-08 LAB — POCT INR: INR: 3.5

## 2014-04-08 NOTE — Progress Notes (Signed)
Pre visit review using our clinic review tool, if applicable. No additional management support is needed unless otherwise documented below in the visit note. 

## 2014-04-08 NOTE — Progress Notes (Signed)
Agree with plan 

## 2014-04-24 ENCOUNTER — Ambulatory Visit: Payer: BLUE CROSS/BLUE SHIELD | Admitting: Internal Medicine

## 2014-05-19 ENCOUNTER — Encounter: Payer: Self-pay | Admitting: Family

## 2014-05-19 ENCOUNTER — Ambulatory Visit (INDEPENDENT_AMBULATORY_CARE_PROVIDER_SITE_OTHER): Payer: BLUE CROSS/BLUE SHIELD | Admitting: Family

## 2014-05-19 VITALS — BP 132/86 | HR 65 | Temp 98.2°F | Resp 18 | Ht 68.5 in | Wt 167.0 lb

## 2014-05-19 DIAGNOSIS — R05 Cough: Secondary | ICD-10-CM | POA: Diagnosis not present

## 2014-05-19 DIAGNOSIS — R059 Cough, unspecified: Secondary | ICD-10-CM | POA: Insufficient documentation

## 2014-05-19 HISTORY — DX: Cough, unspecified: R05.9

## 2014-05-19 MED ORDER — CIPROFLOXACIN HCL 250 MG PO TABS: 250.0000 mg | ORAL_TABLET | Freq: Two times a day (BID) | ORAL | Status: DC

## 2014-05-19 MED ORDER — CEFUROXIME AXETIL 250 MG PO TABS: 250.0000 mg | ORAL_TABLET | Freq: Two times a day (BID) | ORAL | Status: DC

## 2014-05-19 NOTE — Patient Instructions (Addendum)
Thank you for choosing [facility].  Summary/Instructions:  Your prescription(s) have been submitted to your pharmacy or been printed and provided for you. Please take as directed and contact our office if you believe you are having problem(s) with the medication(s) or have any questions.  If your symptoms worsen or fail to improve, please contact our office for further instruction, or in case of emergency go directly to the emergency room at the closest medical facility.   General Recommendations:    Please drink plenty of fluids.  Get plenty of rest   Sleep in humidified air  Use saline nasal sprays  Netti pot   OTC Medications:  Decongestants - helps relieve congestion   Flonase (generic fluticasone) or Nasacort (generic triamcinolone) - please make sure to use the "cross-over" technique at a 45 degree angle towards the opposite eye as opposed to straight up the nasal passageway.   Sudafed (generic pseudoephedrine - Note this is the one that is available behind the pharmacy counter); Products with phenylephrine (-PE) may also be used but is often not as effective as pseudoephedrine.   If you have HIGH BLOOD PRESSURE - Coricidin HBP; AVOID any product that is -D as this contains pseudoephedrine which may increase your blood pressure.  Afrin (oxymetazoline) every 6-8 hours for up to 3 days.   Allergies - helps relieve runny nose, itchy eyes and sneezing   Claritin (generic loratidine), Allegra (fexofenidine), or Zyrtec (generic cyrterizine) for runny nose. These medications should not cause drowsiness.  Note - Benadryl (generic diphenhydramine) may be used however may cause drowsiness  Cough -   Delsym or Robitussin (generic dextromethorphan)  Expectorants - helps loosen mucus to ease removal   Mucinex (generic guaifenesin) as directed on the package.  Headaches / General Aches   Tylenol (generic acetaminophen) - DO NOT EXCEED 3 grams (3,000 mg) in a 24  hour time period  Advil/Motrin (generic ibuprofen)   Sore Throat -   Salt water gargle   Chloraseptic (generic benzocaine) spray or lozenges / Sucrets (generic dyclonine)      

## 2014-05-19 NOTE — Progress Notes (Signed)
Pre visit review using our clinic review tool, if applicable. No additional management support is needed unless otherwise documented below in the visit note. 

## 2014-05-19 NOTE — Assessment & Plan Note (Signed)
Symptoms and exam consistent with upper respiratory infection. Start Ceftin. Start Cipro as needed for urinary tract infection. Continue over-the-counter medications as needed for symptom relief supportive care. Follow-up if symptoms worsen or fail to improve.

## 2014-05-19 NOTE — Progress Notes (Signed)
   Subjective:    Patient ID: Melinda GutterKatharine M Horsley, female    DOB: [birth year], 54 y.o.   MRN: [id number]  Chief Complaint  Patient presents with  . Sore Throat    started last wednesday woke up with a sore throat, headache, congestion, drainage, cough    HPI:  Melinda GutterKatharine M Lemmons is a 54 y.o. female with a PMH of pulmonary embolus, arthritis, and palpitations who presents today for an acute office visit.  This is a new problem. Associated symptoms of sore throat, headache, congestion, and drainage going on for approximately 5 days. Initially noted some improvements yesterday and then work up this morning feeling worse. Modifying factors include dayquil which has helped a little. Denies any recent antibiotic use.    No Known Allergies   Outpatient Prescriptions Prior to Visit  Medication Sig Dispense Refill  . buPROPion (WELLBUTRIN XL) 300 MG 24 hr tablet Take 300 mg by mouth daily.    Marland Kitchen. warfarin (COUMADIN) 6 MG tablet TAKE AS DIRECTED BY ANTICOAGULATION CLINIC. 105 tablet 3  . amoxicillin (AMOXIL) 500 MG capsule Take 4 tablets by mouth one hour before dental procedures  0  . diclofenac sodium (VOLTAREN) 1 % GEL Apply 2 g topically 3 (three) times daily as needed (pain). 100 g 6  . rivaroxaban (XARELTO) 20 MG TABS tablet Take 1 tablet (20 mg total) by mouth daily with supper. (Patient not taking: Reported on 12/11/2013) 30 tablet 6  . triamcinolone cream (KENALOG) 0.1 % Apply 1 application topically 2 (two) times daily. (Patient taking differently: Apply 1 application topically 2 (two) times daily as needed (poison ivy). ) 45 g 2   No facility-administered medications prior to visit.     Review of Systems  Constitutional: Negative for fever and chills.  HENT: Positive for congestion, sinus pressure and sore throat.   Respiratory: Positive for cough. Negative for chest tightness and shortness of breath.   Neurological: Positive for headaches.      Objective:    BP 132/86 mmHg  Pulse  65  Temp(Src) 98.2 F (36.8 C) (Oral)  Resp 18  Ht 5' 8.5" (1.74 m)  Wt 167 lb (75.751 kg)  BMI 25.02 kg/m2  SpO2 97% Nursing note and vital signs reviewed.  Physical Exam  Constitutional: She is oriented to person, place, and time. She appears well-developed and well-nourished. No distress.  HENT:  Right Ear: Hearing, tympanic membrane, external ear and ear canal normal.  Left Ear: Hearing, tympanic membrane, external ear and ear canal normal.  Nose: Right sinus exhibits no maxillary sinus tenderness and no frontal sinus tenderness. Left sinus exhibits no maxillary sinus tenderness and no frontal sinus tenderness.  Mouth/Throat: Uvula is midline, oropharynx is clear and moist and mucous membranes are normal.  Cardiovascular: Normal rate, regular rhythm, normal heart sounds and intact distal pulses.   Pulmonary/Chest: Effort normal and breath sounds normal.  Neurological: She is alert and oriented to person, place, and time.  Skin: Skin is warm and dry.  Psychiatric: She has a normal mood and affect. Her behavior is normal. Judgment and thought content normal.       Assessment & Plan:

## 2014-05-22 ENCOUNTER — Other Ambulatory Visit: Payer: Self-pay | Admitting: Obstetrics and Gynecology

## 2014-05-22 LAB — FSH/LH: FSH: 24.3

## 2014-05-22 LAB — T4: Thyroxine (T4): 6.7

## 2014-05-23 ENCOUNTER — Other Ambulatory Visit (HOSPITAL_COMMUNITY): Payer: Self-pay | Admitting: Obstetrics and Gynecology

## 2014-05-23 DIAGNOSIS — E041 Nontoxic single thyroid nodule: Secondary | ICD-10-CM

## 2014-05-26 LAB — CYTOLOGY - PAP

## 2014-05-28 ENCOUNTER — Ambulatory Visit (HOSPITAL_COMMUNITY)
Admission: RE | Admit: 2014-05-28 | Discharge: 2014-05-28 | Disposition: A | Discharge status: Home / Self Care | Payer: BLUE CROSS/BLUE SHIELD | Source: Ambulatory Visit | Attending: Obstetrics and Gynecology | Admitting: Obstetrics and Gynecology

## 2014-05-28 DIAGNOSIS — E049 Nontoxic goiter, unspecified: Secondary | ICD-10-CM | POA: Diagnosis not present

## 2014-05-28 DIAGNOSIS — E041 Nontoxic single thyroid nodule: Secondary | ICD-10-CM | POA: Insufficient documentation

## 2014-05-30 ENCOUNTER — Telehealth: Payer: Self-pay | Admitting: Geriatric Medicine

## 2014-05-30 NOTE — Telephone Encounter (Signed)
Left message for patient to call me back. She needs to schedule a pre-op visit for her upcoming knee surgery in July.

## 2014-06-03 ENCOUNTER — Ambulatory Visit (INDEPENDENT_AMBULATORY_CARE_PROVIDER_SITE_OTHER): Payer: BLUE CROSS/BLUE SHIELD | Admitting: General Practice

## 2014-06-03 DIAGNOSIS — D6859 Other primary thrombophilia: Secondary | ICD-10-CM | POA: Diagnosis not present

## 2014-06-03 DIAGNOSIS — Z5181 Encounter for therapeutic drug level monitoring: Secondary | ICD-10-CM

## 2014-06-03 DIAGNOSIS — I2699 Other pulmonary embolism without acute cor pulmonale: Secondary | ICD-10-CM

## 2014-06-03 LAB — POCT INR: INR: 2.3

## 2014-06-03 NOTE — Progress Notes (Signed)
I have reviewed and agree with the plan. 

## 2014-06-03 NOTE — Progress Notes (Signed)
Pre visit review using our clinic review tool, if applicable. No additional management support is needed unless otherwise documented below in the visit note. 

## 2014-06-04 ENCOUNTER — Telehealth: Payer: Self-pay | Admitting: Geriatric Medicine

## 2014-06-04 NOTE — Telephone Encounter (Signed)
Left message to ask patient give me a call back. I need to find out if she has had a recent mammogram.

## 2014-06-24 ENCOUNTER — Telehealth: Payer: Self-pay | Admitting: General Practice

## 2014-06-24 NOTE — Telephone Encounter (Signed)
-----   Message from Judie Bonus, MD sent at 06/24/2014  4:07 PM EDT ----- Regarding: RE: Lovenox bridge I would recommend bridging with lovenox for this patient due to her history of multiple PEs. Thanks for asking, Dr. Dorise Hiss ----- Message -----    From: Garrison Columbus, RN    Sent: 06/24/2014   3:55 PM      To: Judie Bonus, MD Subject: Lovenox bridge                                 Patient is having Knee surgery on 7/7 and will need to stop coumadin for 5 days.  Do you recommend a Lovenox bridge for this patient?  Thanks, Bailey Mech

## 2014-06-25 ENCOUNTER — Other Ambulatory Visit: Payer: Self-pay | Admitting: General Practice

## 2014-06-25 MED ORDER — ENOXAPARIN SODIUM 120 MG/0.8ML ~~LOC~~ SOLN: 1.5000 mg/kg | SUBCUTANEOUS | Status: DC

## 2014-06-26 ENCOUNTER — Telehealth: Payer: Self-pay | Admitting: General Practice

## 2014-06-26 NOTE — Telephone Encounter (Signed)
Instructions for Lovenox and Coumadin pre and post surgery  7/2 - Last dose of coumadin 7/3 - Nothing 7/4 - Lovenox X 1 in the AM 7/5 - Lovenox X 1 in the AM 7/6 - Lovenox X 1 in the AM 7/7 - Procedure (NO LOVENOX) 7/8 - Lovenox X 1 and 9 mg coumadin 7/9 - Lovenox X 1 and 9 mg coumadin 7/10 - Lovenox X 1 and 6 mg coumadin 7/11 - Lovenox X 1 and 6 mg coumadin 7/12 - Check INR

## 2014-06-30 ENCOUNTER — Telehealth: Payer: Self-pay | Admitting: Internal Medicine

## 2014-06-30 ENCOUNTER — Telehealth: Payer: Self-pay | Admitting: General Practice

## 2014-06-30 MED ORDER — TRIAMCINOLONE ACETONIDE 0.1 % EX CREA: 1.0000 "application " | TOPICAL_CREAM | Freq: Two times a day (BID) | CUTANEOUS | Status: DC

## 2014-06-30 NOTE — Telephone Encounter (Signed)
Patient has poison ivy and apparently she gets it like this and a shot of steroids will do the trick. Since there are no openings, can we just get her in for an injection. Please advise

## 2014-06-30 NOTE — Telephone Encounter (Signed)
LMOVM

## 2014-06-30 NOTE — Telephone Encounter (Signed)
Spoke with patient and she will go pick up the cream.

## 2014-06-30 NOTE — Telephone Encounter (Signed)
Would recommend visit if not improved with cream in 2-3 days. Have sent in steroid cream to use on rash for resolution. Use twice a day and should be better in 2-3 days. Can not recommend steroid injection without visit.

## 2014-07-02 ENCOUNTER — Telehealth: Payer: Self-pay | Admitting: General Practice

## 2014-07-02 NOTE — Telephone Encounter (Signed)
Fax sent to [name] at [facility] [phone]) for clearance to stop coumadin 5 days prior to procedure on 7/7.  Patient will have Lovenox bridge and has been instructed on usage.

## 2014-07-15 ENCOUNTER — Ambulatory Visit (INDEPENDENT_AMBULATORY_CARE_PROVIDER_SITE_OTHER): Payer: BLUE CROSS/BLUE SHIELD | Admitting: General Practice

## 2014-07-15 DIAGNOSIS — D6859 Other primary thrombophilia: Secondary | ICD-10-CM

## 2014-07-15 DIAGNOSIS — I2699 Other pulmonary embolism without acute cor pulmonale: Secondary | ICD-10-CM | POA: Diagnosis not present

## 2014-07-15 LAB — POCT INR: INR: 2.2

## 2014-07-15 NOTE — Progress Notes (Signed)
I have reviewed and agree with the plan. 

## 2014-07-15 NOTE — Progress Notes (Signed)
Pre visit review using our clinic review tool, if applicable. No additional management support is needed unless otherwise documented below in the visit note. 

## 2014-07-18 ENCOUNTER — Encounter: Payer: Self-pay | Admitting: Physical Therapy

## 2014-07-18 ENCOUNTER — Ambulatory Visit: Payer: BLUE CROSS/BLUE SHIELD | Attending: Specialist | Admitting: Physical Therapy

## 2014-07-18 DIAGNOSIS — M25662 Stiffness of left knee, not elsewhere classified: Secondary | ICD-10-CM

## 2014-07-18 DIAGNOSIS — R29898 Other symptoms and signs involving the musculoskeletal system: Secondary | ICD-10-CM | POA: Diagnosis not present

## 2014-07-18 DIAGNOSIS — M25562 Pain in left knee: Secondary | ICD-10-CM | POA: Diagnosis not present

## 2014-07-18 NOTE — Therapy (Signed)
[facility] [address] [place], [place], [address] Phone: [phone]   Fax:  [phone]  Physical Therapy Evaluation  Patient Details  Name: Melinda Benton MRN: [id number] Date of Birth: 1960-10-12 Referring Provider:  Eugenia Mcalpineollins, Robert, MD  Encounter Date: 07/18/2014      PT End of Session - 07/18/14 1108    Visit Number 1   Date for PT Re-Evaluation 08/18/14   PT Start Time 1015   PT Stop Time 1058   PT Time Calculation (min) 43 min   Activity Tolerance Patient tolerated treatment well   Behavior During Therapy [facility] for tasks assessed/performed      Past Medical History  Diagnosis Date  . Arthritis   . Migraines   . H/O blood clots   . History of chicken pox     Past Surgical History  Procedure Laterality Date  . Tonsillectomy  ?1968  . Rotator cuff repair  2011  . Elbow surgery  2009    There were no vitals filed for this visit.  Visit Diagnosis:  Left knee pain - Plan: PT plan of care cert/re-cert  Knee stiffness, left - Plan: PT plan of care cert/re-cert  Decreased strength involving knee joint - Plan: PT plan of care cert/re-cert      Subjective Assessment - 07/18/14 1017    Subjective Pt states that she had the surgery thursday and used the crutches for a couple days then started without. The first day the knee was tight but after that it has been great.  Had stiitches out this morinng. Pt denies using pain medication currently.  This is the first surgery on this knee.  Pt states [place] so she would like to get better for that.    How long can you sit comfortably? none   How long can you stand comfortably? hour plus   How long can you walk comfortably? hour plus   Patient Stated Goals hiking, tennis, strengthen quads   Currently in Pain? No/denies  tightness   Pain Descriptors / Indicators Tightness   Pain Type Surgical pain   Pain Onset In the  past 7 days            [facility] PT Assessment - 07/18/14 0001    Assessment   Medical Diagnosis s/p left knee arthroscopy with meniscal debridement   Onset Date/Surgical Date 07/10/14   Next MD Visit 08/18/14   Prior Therapy no therapy prior to surgery   Restrictions   Weight Bearing Restrictions No   Balance Screen   Has the patient fallen in the past 6 months No   Home Environment   Living Environment Private residence   Living Arrangements Spouse/significant other;Children   Available Help at Discharge Family   Type of Home House   Home Access Stairs to enter   Entrance Stairs-Number of Steps 14   Entrance Stairs-Rails Right   Home Layout Multi-level   Prior Function   Level of Independence Independent   Vocation --  no   Leisure hiking, tennis, children,    Observation/Other Assessments   Skin Integrity , closed with no seepage   Observation/Other Assessments-Edema    Edema --  slight   ROM / Strength   AROM / PROM / Strength AROM   AROM   Overall AROM Comments Tight with initial movements but large improvement in flexion with towel pull exercises   Left Knee Extension  0   Left Knee Flexion 104   Palpation   Palpation comment TTp medial joint line   Ambulation/Gait   Gait Comments slight antalgic gait upon entry but after evaluation gait looked great                   [facility] Adult PT Treatment/Exercise - 07/18/14 0001    Knee/Hip Exercises: Stretches   Quad Stretch Left;3 reps;30 seconds  towel pulls   Knee/Hip Exercises: Supine   Quad Sets AROM;3 sets;15 reps   Short Arc Quad Sets AROM;3 sets;15 reps   Heel Slides AROM;1 set;10 reps                PT Education - 07/18/14 1108    Education provided Yes   Education Details Instructed pt on HEP, RICE   Methods Explanation;Demonstration;Tactile cues;Verbal cues;Handout   Comprehension Verbalized understanding;Returned demonstration;Verbal cues required;Tactile cues required;Need further  instruction          PT Short Term Goals - 07/18/14 1115    PT SHORT TERM GOAL #1   Title PT will be ind with HEP   Time 1   Period Weeks   Status New           PT Long Term Goals - 07/18/14 1115    PT LONG TERM GOAL #1   Title Pt will increase flexion to greater than 120 degrees to encourage normal gait.     Time 4   Period Weeks   Status New   PT LONG TERM GOAL #2   Title Pt will become ind with advanced HEP to allow personal gym attendance   Time 4   Period Weeks   Status New   PT LONG TERM GOAL #3   Title Pt will be able to return to high level activities with decrease in pain.   Time 8   Period Weeks   Status New   PT LONG TERM GOAL #4   Title Pt will increase quad strength to 5/5 to safely perform hiking activities   Time 8   Period Weeks   Status New               Plan - 07/18/14 1108    Clinical Impression Statement Pt presents to outpatient ortho following L knee arthroscopy and debridement.  Pt is amb ind without an AD. Slight antalgic gait upon entry but improved to normal gait by end of session.  Pt is very active however has had a decrease in activity since late march when her knee started hurting her.  [employer].  Pt displays minor swelling in suprapattellar pouch and medial joint line, 3 suture incicions intact with no seepage, TTP along medial joint line.  PT tolerated exercises well, had initial  decrease in quad activation that improved with quad sets and SAQ.  No quad lag with SLR.     Pt will benefit from skilled therapeutic intervention in order to improve on the following deficits Decreased activity tolerance;Decreased coordination;Decreased balance;Decreased endurance;Decreased mobility;Decreased range of motion;Decreased strength;Difficulty walking;Increased edema;Impaired flexibility   Rehab Potential Excellent   PT Frequency 2x / week   PT Duration 8 weeks   PT Treatment/Interventions ADLs/Self Care Home  Management;Cryotherapy;Electrical Stimulation;Iontophoresis /ml Dexamethasone;Moist Heat;Ultrasound;Gait training;Stair training;Functional mobility training;Therapeutic activities;Therapeutic exercise;Balance training;Neuromuscular re-education;Patient/family education;Manual techniques;Scar mobilization;Passive range of motion   PT Next Visit Plan Add in quad/hamstring strengthening and balance as well as equipment education for gym use   PT Home Exercise Plan  quad sets, SAQ, towel pulls, heel slides   Consulted and Agree with Plan of Care Patient         Problem List Patient Active Problem List   Diagnosis Date Noted  . Cough 05/19/2014  . Palpitations 10/29/2013  . Arthritis 10/09/2013  . Encounter for general health examination 10/09/2013  . Pulmonary embolus 02/15/2010  . Hypercoagulable state 02/15/2010    Jearld Lesch., PT 07/18/2014, 11:54 AM  [facility] [address] [place], [place], [address] Phone: [phone]   Fax:  [phone]

## 2014-07-21 ENCOUNTER — Ambulatory Visit: Payer: BLUE CROSS/BLUE SHIELD | Admitting: Physical Therapy

## 2014-07-21 DIAGNOSIS — M25562 Pain in left knee: Secondary | ICD-10-CM | POA: Diagnosis not present

## 2014-07-21 DIAGNOSIS — M25662 Stiffness of left knee, not elsewhere classified: Secondary | ICD-10-CM

## 2014-07-21 DIAGNOSIS — R29898 Other symptoms and signs involving the musculoskeletal system: Secondary | ICD-10-CM

## 2014-07-23 NOTE — Therapy (Signed)
[facility] [address] [place], [place], [address] Phone: [phone]   Fax:  [phone]  Physical Therapy Treatment  Patient Details  Name: Melinda GutterKatharine M Burggraf MRN: [id number] Date of Birth: Jul 22, 1960 Referring Provider:  Eugenia Mcalpineollins, Robert, MD  Encounter Date: 07/21/2014    Past Medical History  Diagnosis Date  . Arthritis   . Migraines   . H/O blood clots   . History of chicken pox     Past Surgical History  Procedure Laterality Date  . Tonsillectomy  ?1968  . Rotator cuff repair  2011  . Elbow surgery  2009    There were no vitals filed for this visit.  Visit Diagnosis:  Left knee pain  Knee stiffness, left  Decreased strength involving knee joint                                 PT Short Term Goals - 07/18/14 1115    PT SHORT TERM GOAL #1   Title PT will be ind with HEP   Time 1   Period Weeks   Status New           PT Long Term Goals - 07/18/14 1115    PT LONG TERM GOAL #1   Title Pt will increase flexion to greater than 120 degrees to encourage normal gait.     Time 4   Period Weeks   Status New   PT LONG TERM GOAL #2   Title Pt will become ind with advanced HEP to allow personal gym attendance   Time 4   Period Weeks   Status New   PT LONG TERM GOAL #3   Title Pt will be able to return to high level activities with decrease in pain.   Time 8   Period Weeks   Status New   PT LONG TERM GOAL #4   Title Pt will increase quad strength to 5/5 to safely perform hiking activities   Time 8   Period Weeks   Status New               Problem List Patient Active Problem List   Diagnosis Date Noted  . Cough 05/19/2014  . Palpitations 10/29/2013  . Arthritis 10/09/2013  . Encounter for general health examination 10/09/2013  . Pulmonary embolus 02/15/2010  . Hypercoagulable state 02/15/2010    Jearld LeschALBRIGHT,Mahati Vajda W., PT 07/23/2014, 8:20 AM  [facility] [address] [place], [place], [address] Phone: [phone]   Fax:  [phone]

## 2014-07-28 ENCOUNTER — Ambulatory Visit: Payer: BLUE CROSS/BLUE SHIELD | Admitting: Physical Therapy

## 2014-09-10 ENCOUNTER — Ambulatory Visit: Payer: BLUE CROSS/BLUE SHIELD

## 2014-09-17 ENCOUNTER — Ambulatory Visit (INDEPENDENT_AMBULATORY_CARE_PROVIDER_SITE_OTHER): Payer: BLUE CROSS/BLUE SHIELD | Admitting: General Practice

## 2014-09-17 DIAGNOSIS — Z5181 Encounter for therapeutic drug level monitoring: Secondary | ICD-10-CM | POA: Diagnosis not present

## 2014-09-17 DIAGNOSIS — D6859 Other primary thrombophilia: Secondary | ICD-10-CM

## 2014-09-17 DIAGNOSIS — I2699 Other pulmonary embolism without acute cor pulmonale: Secondary | ICD-10-CM

## 2014-09-17 LAB — POCT INR: INR: 3.8

## 2014-09-17 NOTE — Progress Notes (Signed)
Pre visit review using our clinic review tool, if applicable. No additional management support is needed unless otherwise documented below in the visit note. 

## 2014-09-17 NOTE — Progress Notes (Signed)
I have reviewed and agree with the plan. 

## 2014-11-12 ENCOUNTER — Ambulatory Visit (INDEPENDENT_AMBULATORY_CARE_PROVIDER_SITE_OTHER): Payer: BLUE CROSS/BLUE SHIELD | Admitting: General Practice

## 2014-11-12 DIAGNOSIS — Z5181 Encounter for therapeutic drug level monitoring: Secondary | ICD-10-CM | POA: Diagnosis not present

## 2014-11-12 DIAGNOSIS — D6859 Other primary thrombophilia: Secondary | ICD-10-CM | POA: Diagnosis not present

## 2014-11-12 DIAGNOSIS — I2699 Other pulmonary embolism without acute cor pulmonale: Secondary | ICD-10-CM

## 2014-11-12 LAB — POCT INR: INR: 2.4

## 2014-11-12 NOTE — Progress Notes (Signed)
I have reviewed and agree with the plan. 

## 2014-11-12 NOTE — Progress Notes (Signed)
Pre visit review using our clinic review tool, if applicable. No additional management support is needed unless otherwise documented below in the visit note. 

## 2014-12-15 ENCOUNTER — Other Ambulatory Visit: Payer: Self-pay | Admitting: General Practice

## 2014-12-15 ENCOUNTER — Ambulatory Visit (INDEPENDENT_AMBULATORY_CARE_PROVIDER_SITE_OTHER): Payer: BLUE CROSS/BLUE SHIELD | Admitting: General Practice

## 2014-12-15 DIAGNOSIS — Z5181 Encounter for therapeutic drug level monitoring: Secondary | ICD-10-CM | POA: Diagnosis not present

## 2014-12-15 DIAGNOSIS — D6859 Other primary thrombophilia: Secondary | ICD-10-CM

## 2014-12-15 DIAGNOSIS — I2699 Other pulmonary embolism without acute cor pulmonale: Secondary | ICD-10-CM | POA: Diagnosis not present

## 2014-12-15 LAB — POCT INR: INR: 2

## 2014-12-15 MED ORDER — WARFARIN SODIUM 6 MG PO TABS: ORAL_TABLET | ORAL | Status: DC

## 2014-12-15 NOTE — Progress Notes (Signed)
Pre visit review using our clinic review tool, if applicable. No additional management support is needed unless otherwise documented below in the visit note. 

## 2014-12-15 NOTE — Progress Notes (Signed)
I agree with this plan.

## 2014-12-30 ENCOUNTER — Ambulatory Visit: Payer: BLUE CROSS/BLUE SHIELD

## 2014-12-30 ENCOUNTER — Telehealth: Payer: Self-pay | Admitting: General Practice

## 2014-12-30 NOTE — Telephone Encounter (Signed)
LMOVM.  Unable to reach patient.

## 2014-12-31 ENCOUNTER — Ambulatory Visit: Payer: BLUE CROSS/BLUE SHIELD

## 2015-02-18 ENCOUNTER — Ambulatory Visit (INDEPENDENT_AMBULATORY_CARE_PROVIDER_SITE_OTHER): Payer: BLUE CROSS/BLUE SHIELD | Admitting: General Practice

## 2015-02-18 DIAGNOSIS — Z5181 Encounter for therapeutic drug level monitoring: Secondary | ICD-10-CM

## 2015-02-18 DIAGNOSIS — D6859 Other primary thrombophilia: Secondary | ICD-10-CM

## 2015-02-18 DIAGNOSIS — I2699 Other pulmonary embolism without acute cor pulmonale: Secondary | ICD-10-CM

## 2015-02-18 LAB — POCT INR: INR: 3

## 2015-02-18 NOTE — Progress Notes (Signed)
I have reviewed and agree with the plan. 

## 2015-02-18 NOTE — Progress Notes (Signed)
Pre visit review using our clinic review tool, if applicable. No additional management support is needed unless otherwise documented below in the visit note. 

## 2015-03-04 ENCOUNTER — Ambulatory Visit (INDEPENDENT_AMBULATORY_CARE_PROVIDER_SITE_OTHER): Payer: BLUE CROSS/BLUE SHIELD | Admitting: General Practice

## 2015-03-04 DIAGNOSIS — Z5181 Encounter for therapeutic drug level monitoring: Secondary | ICD-10-CM

## 2015-03-04 DIAGNOSIS — D6859 Other primary thrombophilia: Secondary | ICD-10-CM | POA: Diagnosis not present

## 2015-03-04 DIAGNOSIS — I2699 Other pulmonary embolism without acute cor pulmonale: Secondary | ICD-10-CM

## 2015-03-04 LAB — POCT INR: INR: 1.4

## 2015-03-04 NOTE — Progress Notes (Signed)
Pre visit review using our clinic review tool, if applicable. No additional management support is needed unless otherwise documented below in the visit note. INR is low today.  Encouraged patient to purchase and use pill box to manage medications.  Reiterated the risks of a sub-therapeutic INR.  Pt verbalized understanding.

## 2015-03-04 NOTE — Progress Notes (Signed)
I have reviewed and agree with the plan. 

## 2015-03-10 ENCOUNTER — Other Ambulatory Visit (HOSPITAL_COMMUNITY): Payer: Self-pay | Admitting: Endocrinology

## 2015-03-10 DIAGNOSIS — E041 Nontoxic single thyroid nodule: Secondary | ICD-10-CM

## 2015-03-20 ENCOUNTER — Ambulatory Visit: Payer: BLUE CROSS/BLUE SHIELD

## 2015-05-15 ENCOUNTER — Ambulatory Visit (INDEPENDENT_AMBULATORY_CARE_PROVIDER_SITE_OTHER): Payer: BLUE CROSS/BLUE SHIELD | Admitting: General Practice

## 2015-05-15 DIAGNOSIS — D6859 Other primary thrombophilia: Secondary | ICD-10-CM

## 2015-05-15 DIAGNOSIS — Z5181 Encounter for therapeutic drug level monitoring: Secondary | ICD-10-CM

## 2015-05-15 DIAGNOSIS — I2699 Other pulmonary embolism without acute cor pulmonale: Secondary | ICD-10-CM

## 2015-05-15 LAB — POCT INR: INR: 2.3

## 2015-05-15 NOTE — Progress Notes (Signed)
I have reviewed and agree with the plan. 

## 2015-05-15 NOTE — Progress Notes (Signed)
Pre visit review using our clinic review tool, if applicable. No additional management support is needed unless otherwise documented below in the visit note. 

## 2015-06-05 ENCOUNTER — Ambulatory Visit (HOSPITAL_COMMUNITY)
Admission: RE | Admit: 2015-06-05 | Discharge: 2015-06-05 | Disposition: A | Discharge status: Home / Self Care | Payer: BLUE CROSS/BLUE SHIELD | Source: Ambulatory Visit | Attending: Endocrinology | Admitting: Endocrinology

## 2015-06-05 DIAGNOSIS — E041 Nontoxic single thyroid nodule: Secondary | ICD-10-CM

## 2015-06-15 ENCOUNTER — Other Ambulatory Visit: Payer: Self-pay | Admitting: *Deleted

## 2015-06-15 DIAGNOSIS — I83812 Varicose veins of left lower extremities with pain: Secondary | ICD-10-CM

## 2015-06-18 LAB — HM DEXA SCAN

## 2015-06-24 ENCOUNTER — Other Ambulatory Visit: Payer: Self-pay | Admitting: *Deleted

## 2015-06-24 DIAGNOSIS — I83812 Varicose veins of left lower extremities with pain: Secondary | ICD-10-CM

## 2015-07-01 ENCOUNTER — Ambulatory Visit (INDEPENDENT_AMBULATORY_CARE_PROVIDER_SITE_OTHER): Payer: BLUE CROSS/BLUE SHIELD | Admitting: General Practice

## 2015-07-01 DIAGNOSIS — D6859 Other primary thrombophilia: Secondary | ICD-10-CM

## 2015-07-01 LAB — POCT INR: INR: 1.9

## 2015-07-01 NOTE — Progress Notes (Signed)
Pre visit review using our clinic review tool, if applicable. No additional management support is needed unless otherwise documented below in the visit note. 

## 2015-07-01 NOTE — Progress Notes (Signed)
I have reviewed and agree with the plan. 

## 2015-08-04 ENCOUNTER — Other Ambulatory Visit (HOSPITAL_COMMUNITY): Payer: Self-pay | Admitting: Endocrinology

## 2015-08-04 DIAGNOSIS — E041 Nontoxic single thyroid nodule: Secondary | ICD-10-CM

## 2015-08-24 ENCOUNTER — Encounter: Payer: Self-pay | Admitting: Vascular Surgery

## 2015-08-25 ENCOUNTER — Ambulatory Visit (HOSPITAL_COMMUNITY): Payer: BLUE CROSS/BLUE SHIELD

## 2015-08-25 ENCOUNTER — Encounter: Payer: BLUE CROSS/BLUE SHIELD | Admitting: Vascular Surgery

## 2015-08-26 ENCOUNTER — Ambulatory Visit: Payer: BLUE CROSS/BLUE SHIELD

## 2015-08-28 ENCOUNTER — Encounter: Payer: Self-pay | Admitting: Family

## 2015-09-01 ENCOUNTER — Ambulatory Visit (INDEPENDENT_AMBULATORY_CARE_PROVIDER_SITE_OTHER): Payer: BLUE CROSS/BLUE SHIELD | Admitting: Vascular Surgery

## 2015-09-01 ENCOUNTER — Encounter: Payer: Self-pay | Admitting: Vascular Surgery

## 2015-09-01 VITALS — BP 130/84 | HR 74 | Temp 98.6°F | Resp 16 | Ht 68.5 in | Wt 161.0 lb

## 2015-09-01 DIAGNOSIS — I868 Varicose veins of other specified sites: Secondary | ICD-10-CM | POA: Insufficient documentation

## 2015-09-01 NOTE — Progress Notes (Signed)
Subjective:     Patient ID: Melinda Benton, female   DOB: 1960-12-04, 55 y.o.   MRN: [id number]  HPI This 55 year old female was referred by Dr. Richardean Chimera for evaluation of possible venous insufficiency in the left leg. Patient describes some bluish discoloration in the left ankle and some swelling in the left leg over the past few years particularly towards the end of the day. She has a strange sensation around her left ankle area and also develops leg cramps in both legs at night. She does wear elastic compression stockings both short leg and long leg and wears those at night as well. She has no history of DVT thrombophlebitis stasis ulcers or bleeding. 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 She does occasionally have some easy bruisability because of the Coumadin. She has no symptoms in the right leg other than cramping at night.  Past Medical History:  Diagnosis Date  . Arthritis   . H/O blood clots   . History of chicken pox   . Migraines   . Mitral valve prolapse   . Thyroid disease     Social History  Substance Use Topics  . Smoking status: Never Smoker  . Smokeless tobacco: Never Used  . Alcohol use No    Family History  Problem Relation Age of Onset  . Protein S deficiency Mother   . Heart disease Father   . Diabetes Father   . Colon cancer Paternal Grandfather   . Stroke Brother   . Diabetes Paternal Aunt   . Diabetes Paternal Uncle   . Colon cancer Paternal Grandmother     No Known Allergies   Current Outpatient Prescriptions:  .  buPROPion (WELLBUTRIN XL) 300 MG 24 hr tablet, Take 300 mg by mouth daily., Disp: , Rfl:  .  levothyroxine (SYNTHROID, LEVOTHROID) 75 MCG tablet, , Disp: , Rfl:  .  warfarin (COUMADIN) 6 MG tablet, TAKE AS DIRECTED BY ANTICOAGULATION CLINIC., Disp: 105 tablet, Rfl: 3 .  cefUROXime (CEFTIN) 250 MG tablet, Take 1 tablet (250 mg total) by mouth 2  (two) times daily with a meal. (Patient not taking: Reported on 09/01/2015), Disp: 20 tablet, Rfl: 0 .  ciprofloxacin (CIPRO) 250 MG tablet, Take 1 tablet (250 mg total) by mouth 2 (two) times daily. (Patient not taking: Reported on 09/01/2015), Disp: 20 tablet, Rfl: 0 .  enoxaparin (LOVENOX) 120 MG/0.8ML injection, Inject 0.76 mLs (115 mg total) into the skin daily. (Patient not taking: Reported on 09/01/2015), Disp: 7 Syringe, Rfl: 0 .  triamcinolone cream (KENALOG) 0.1 %, Apply 1 application topically 2 (two) times daily. (Patient not taking: Reported on 09/01/2015), Disp: 30 g, Rfl: 0  Vitals:   09/01/15 1000  BP: 130/84  Pulse: 74  Resp: 16  Temp: 98.6 F (37 C)  SpO2: 98%  Weight: 161 lb (73 kg)  Height: 5' 8.5" (1.74 m)    Body mass index is 24.12 kg/m.         Review of Systems She denies chest pain, dyspnea on exertion, PND, orthopnea, hemoptysis. Does have history of pulmonary embolus as noted. 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 Katrinka Blazing. Protein S deficiency on chronic Coumadin followed by the lead by  Coumadin clinic-other systems negative and complete review of systems    Objective:   Physical Exam BP 130/84 (BP Location: Left Arm, Patient Position:  Sitting, Cuff Size: Normal)   Pulse 74   Temp 98.6 F (37 C)   Resp 16   Ht 5' 8.5" (1.74 m)   Wt 161 lb (73 kg)   SpO2 98%   BMI 24.12 kg/m     Gen.-alert and oriented x3 in no apparent distress HEENT normal for age Lungs no rhonchi or wheezing Cardiovascular regular rhythm no murmurs carotid pulses 3+ palpable no bruits audible Abdomen soft nontender no palpable masses Musculoskeletal free of  major deformities Skin clear -no rashes Neurologic normal Lower extremities 3+ femoral and dorsalis pedis pulses palpable bilaterally with no edema Left leg has some spider veins in the left medial malleolar area which are small. No  hyperpigmentation or ulcerations noted. No bulging varicosities noted in either leg. Well-perfused feet with 3+ dorsalis and posterior tibial pulses palpable bilaterally.  Today I performed a bedside sono site ultrasound exam which revealed normal sized left great saphenous vein  with no reflux and normal size left small saphenous vein with no reflux       Assessment:     #1 spider veins left ankle area-questionably symptomatic and quite small #2 protein S deficiency-on chronic Coumadin therapy #3 mitral valve prolapse #4 history of tachycardia arrhythmia treated with ablation #5 history of pulmonary embolus 1992 related to #2    Plan:     Recommend no treatment of her venous disease which is quite mild. Spider veins aren't too small to inject and doubtful that they are causing any symptoms No evidence of arterial insufficiency Return to see me on a when necessary basis Her questions were answered

## 2015-09-02 ENCOUNTER — Encounter (INDEPENDENT_AMBULATORY_CARE_PROVIDER_SITE_OTHER): Payer: BLUE CROSS/BLUE SHIELD

## 2015-12-04 ENCOUNTER — Ambulatory Visit: Payer: BLUE CROSS/BLUE SHIELD

## 2015-12-07 ENCOUNTER — Ambulatory Visit (HOSPITAL_COMMUNITY)
Admission: RE | Admit: 2015-12-07 | Discharge: 2015-12-07 | Disposition: A | Discharge status: Home / Self Care | Payer: BLUE CROSS/BLUE SHIELD | Source: Ambulatory Visit | Attending: Endocrinology | Admitting: Endocrinology

## 2015-12-07 DIAGNOSIS — E041 Nontoxic single thyroid nodule: Secondary | ICD-10-CM

## 2015-12-11 ENCOUNTER — Ambulatory Visit (INDEPENDENT_AMBULATORY_CARE_PROVIDER_SITE_OTHER): Payer: BLUE CROSS/BLUE SHIELD | Admitting: General Practice

## 2015-12-11 DIAGNOSIS — D6859 Other primary thrombophilia: Secondary | ICD-10-CM

## 2015-12-11 DIAGNOSIS — Z5181 Encounter for therapeutic drug level monitoring: Secondary | ICD-10-CM

## 2015-12-11 DIAGNOSIS — I2699 Other pulmonary embolism without acute cor pulmonale: Secondary | ICD-10-CM

## 2015-12-11 LAB — POCT INR: INR: 1.2

## 2015-12-11 NOTE — Patient Instructions (Addendum)
Pre visit review using our clinic review tool, if applicable. No additional management support is needed unless otherwise documented below in the visit note. INR is low today due to missed doses of coumadin.  Dosage increased back to 6 mg daily.  Will re-check in 2 weeks.

## 2015-12-14 ENCOUNTER — Other Ambulatory Visit: Payer: Self-pay | Admitting: Internal Medicine

## 2015-12-15 ENCOUNTER — Other Ambulatory Visit: Payer: Self-pay | Admitting: General Practice

## 2015-12-15 MED ORDER — WARFARIN SODIUM 6 MG PO TABS: ORAL_TABLET | ORAL | 0 refills | Status: DC

## 2015-12-25 ENCOUNTER — Ambulatory Visit (INDEPENDENT_AMBULATORY_CARE_PROVIDER_SITE_OTHER): Payer: BLUE CROSS/BLUE SHIELD | Admitting: General Practice

## 2015-12-25 DIAGNOSIS — D6859 Other primary thrombophilia: Secondary | ICD-10-CM | POA: Diagnosis not present

## 2015-12-25 LAB — POCT INR: INR: 3.4

## 2015-12-25 NOTE — Patient Instructions (Signed)
Pre visit review using our clinic review tool, if applicable. No additional management support is needed unless otherwise documented below in the visit note. 

## 2015-12-25 NOTE — Progress Notes (Signed)
I have reviewed and agree with the plan. 

## 2016-01-29 ENCOUNTER — Ambulatory Visit: Payer: BLUE CROSS/BLUE SHIELD

## 2016-02-03 ENCOUNTER — Ambulatory Visit: Payer: BLUE CROSS/BLUE SHIELD

## 2016-02-05 ENCOUNTER — Ambulatory Visit (INDEPENDENT_AMBULATORY_CARE_PROVIDER_SITE_OTHER): Payer: BLUE CROSS/BLUE SHIELD | Admitting: General Practice

## 2016-02-05 DIAGNOSIS — Z5181 Encounter for therapeutic drug level monitoring: Secondary | ICD-10-CM | POA: Diagnosis not present

## 2016-02-05 DIAGNOSIS — I2699 Other pulmonary embolism without acute cor pulmonale: Secondary | ICD-10-CM

## 2016-02-05 DIAGNOSIS — D6859 Other primary thrombophilia: Secondary | ICD-10-CM

## 2016-02-05 LAB — POCT INR: INR: 1.6

## 2016-02-05 NOTE — Patient Instructions (Signed)
Pre visit review using our clinic review tool, if applicable. No additional management support is needed unless otherwise documented below in the visit note. 

## 2016-02-05 NOTE — Progress Notes (Signed)
I have reviewed and agree with the plan. 

## 2016-03-04 ENCOUNTER — Ambulatory Visit: Payer: BLUE CROSS/BLUE SHIELD

## 2016-03-10 ENCOUNTER — Other Ambulatory Visit: Payer: Self-pay | Admitting: Internal Medicine

## 2016-06-07 DIAGNOSIS — M25561 Pain in right knee: Secondary | ICD-10-CM | POA: Insufficient documentation

## 2016-06-07 DIAGNOSIS — G8929 Other chronic pain: Secondary | ICD-10-CM | POA: Insufficient documentation

## 2016-06-08 ENCOUNTER — Other Ambulatory Visit: Payer: Self-pay | Admitting: Internal Medicine

## 2016-06-13 ENCOUNTER — Other Ambulatory Visit: Payer: Self-pay | Admitting: General Practice

## 2016-06-13 MED ORDER — WARFARIN SODIUM 6 MG PO TABS: ORAL_TABLET | ORAL | 0 refills | Status: DC

## 2016-06-15 ENCOUNTER — Ambulatory Visit (INDEPENDENT_AMBULATORY_CARE_PROVIDER_SITE_OTHER): Payer: BLUE CROSS/BLUE SHIELD | Admitting: General Practice

## 2016-06-15 ENCOUNTER — Ambulatory Visit: Payer: BLUE CROSS/BLUE SHIELD | Admitting: Nurse Practitioner

## 2016-06-15 DIAGNOSIS — D6859 Other primary thrombophilia: Secondary | ICD-10-CM

## 2016-06-15 LAB — POCT INR: INR: 2.9

## 2016-06-15 NOTE — Progress Notes (Signed)
I have reviewed and agree with the plan. 

## 2016-06-15 NOTE — Patient Instructions (Addendum)
Pre visit review using our clinic review tool, if applicable. No additional management support is needed unless otherwise documented below in the visit note.  7/4 - Take last dose of coumadin until after surgery 7/5 - Nothing 7/6 - Lovenox in the AM 7/7 - Lovenox in the AM 7/8 - Lovenox in the AM 7/9 - Surgery - Do not take Lovenox 7/10 - Lovenox in the AM and take 1 1/2 tablets of coumadin 7/11 - Lovenox in the Am and take 1 1/2 tablets of coumadin 7/12 - Lovenox in the AM and take 1 tablet of coumadin 7/13 - Re-check in coumadin clinic.

## 2016-06-21 ENCOUNTER — Other Ambulatory Visit (INDEPENDENT_AMBULATORY_CARE_PROVIDER_SITE_OTHER): Payer: BLUE CROSS/BLUE SHIELD

## 2016-06-21 ENCOUNTER — Ambulatory Visit (INDEPENDENT_AMBULATORY_CARE_PROVIDER_SITE_OTHER): Payer: BLUE CROSS/BLUE SHIELD | Admitting: Family

## 2016-06-21 ENCOUNTER — Encounter: Payer: Self-pay | Admitting: Family

## 2016-06-21 VITALS — BP 110/72 | HR 76 | Temp 98.5°F | Resp 16 | Ht 68.0 in | Wt 154.4 lb

## 2016-06-21 DIAGNOSIS — Z01818 Encounter for other preprocedural examination: Secondary | ICD-10-CM | POA: Diagnosis not present

## 2016-06-21 DIAGNOSIS — M25561 Pain in right knee: Secondary | ICD-10-CM

## 2016-06-21 DIAGNOSIS — G8929 Other chronic pain: Secondary | ICD-10-CM | POA: Diagnosis not present

## 2016-06-21 LAB — COMPREHENSIVE METABOLIC PANEL
ALBUMIN: 4.5 g/dL (ref 3.5–5.2)
ALK PHOS: 51 U/L (ref 39–117)
ALT: 15 U/L (ref 0–35)
AST: 17 U/L (ref 0–37)
BUN: 17 mg/dL (ref 6–23)
CO2: 29 mEq/L (ref 19–32)
Calcium: 9.7 mg/dL (ref 8.4–10.5)
Chloride: 104 mEq/L (ref 96–112)
Creatinine, Ser: 1.08 mg/dL (ref 0.40–1.20)
GFR: 55.77 mL/min — AB (ref >60.00)
GLUCOSE: 91 mg/dL (ref 70–99)
Potassium: 3.8 mEq/L (ref 3.5–5.1)
Sodium: 141 mEq/L (ref 135–145)
Total Bilirubin: 0.4 mg/dL (ref 0.2–1.2)
Total Protein: 6.8 g/dL (ref 6.0–8.3)

## 2016-06-21 LAB — CBC
HCT: 43.5 % (ref 36.0–46.0)
HEMOGLOBIN: 14.9 g/dL (ref 12.0–15.0)
MCHC: 34.3 g/dL (ref 30.0–36.0)
MCV: 90.6 fl (ref 78.0–100.0)
Platelets: 218 10*3/uL (ref 150.0–400.0)
RBC: 4.8 Mil/uL (ref 3.87–5.11)
RDW: 12.5 % (ref 11.5–15.5)
WBC: 5.5 10*3/uL (ref 4.0–10.5)

## 2016-06-21 LAB — HEMOGLOBIN A1C: Hgb A1c MFr Bld: 5.5 % (ref 4.6–6.5)

## 2016-06-21 NOTE — Patient Instructions (Signed)
Thank you for choosing [facility].  SUMMARY AND INSTRUCTIONS:  Please continue to take your medications as prescribed.   Stop in the lab to complete your blood work.  Pending blood work you will be cleared for surgery.   Best wishes for your surgery.   Labs:  Please stop by the lab on the lower level of the building for your blood work. Your results will be released to MyChart (or called to you) after review, usually within 72 hours after test completion. If any changes need to be made, you will be notified at that same time.  1.) The lab is open from 7:30am to 5:30 pm Monday-Friday 2.) No appointment is necessary 3.) Fasting (if needed) is 6-8 hours after food and drink; black coffee and water are okay   Follow up:  If your symptoms worsen or fail to improve, please contact our office for further instruction, or in case of emergency go directly to the emergency room at the closest medical facility.

## 2016-06-21 NOTE — Assessment & Plan Note (Signed)
Chronic pain of right knee with no significant trauma having failed multiple conservative treatments with the new recommendation for surgical intervention given "bone on bone". Follow-up and surgical scheduling per orthopedics.

## 2016-06-21 NOTE — Progress Notes (Signed)
Subjective:    Patient ID: Melinda Benton, female    DOB: 11-Dec-1960, 56 y.o.   MRN: [id number]  Chief Complaint  Patient presents with  . Pre-op Exam    left partial knee replacement    HPI:  Melinda Benton is a 57 y.o. female who  has a past medical history of Arthritis; H/O blood clots; History of chicken pox; Migraines; Mitral valve prolapse; and Thyroid disease. and presents today   This is a new problem. Experiencing the associated symptom of pain located in her left knee that has failed conservative treatment including physical therapy, cortisone, and hylaronic acid injections.  Described as "bone on bone" and severity interferes with her ability to be as functional as she would like to be secondary to pain. It isrecommended for her to have a partial knee replacement as the next step.    No Known Allergies    Outpatient Medications Prior to Visit  Medication Sig Dispense Refill  . buPROPion (WELLBUTRIN XL) 300 MG 24 hr tablet Take 300 mg by mouth daily.    Marland Kitchen levothyroxine (SYNTHROID, LEVOTHROID) 75 MCG tablet     . warfarin (COUMADIN) 6 MG tablet TAKE AS DIRECTED BY ANTICOAGULATION CLINIC 105 tablet 0  . cefUROXime (CEFTIN) 250 MG tablet Take 1 tablet (250 mg total) by mouth 2 (two) times daily with a meal. (Patient not taking: Reported on 09/01/2015) 20 tablet 0  . ciprofloxacin (CIPRO) 250 MG tablet Take 1 tablet (250 mg total) by mouth 2 (two) times daily. (Patient not taking: Reported on 09/01/2015) 20 tablet 0  . enoxaparin (LOVENOX) 120 MG/0.8ML injection Inject 0.76 mLs (115 mg total) into the skin daily. (Patient not taking: Reported on 09/01/2015) 7 Syringe 0  . triamcinolone cream (KENALOG) 0.1 % Apply 1 application topically 2 (two) times daily. (Patient not taking: Reported on 09/01/2015) 30 g 0   No facility-administered medications prior to visit.       Past Surgical History:  Procedure Laterality Date  . ELBOW SURGERY  2009  . ROTATOR CUFF REPAIR   2011  . TONSILLECTOMY  ?1968      Past Medical History:  Diagnosis Date  . Arthritis   . H/O blood clots   . History of chicken pox   . Migraines   . Mitral valve prolapse   . Thyroid disease       Review of Systems   Constitutional: Denies fever, chills, fatigue, or significant weight gain/loss. HENT: Head: Denies headache or neck pain Ears: Denies changes in hearing, ringing in ears, earache, drainage Nose: Denies discharge, stuffiness, itching, nosebleed, sinus pain Throat: Denies sore throat, hoarseness, dry mouth, sores, thrush Eyes: Denies loss/changes in vision, pain, redness, blurry/double vision, flashing lights Cardiovascular: Denies chest pain/discomfort, tightness, palpitations, shortness of breath with activity, difficulty lying down, swelling, sudden awakening with shortness of breath Respiratory: Denies shortness of breath, cough, sputum production, wheezing Gastrointestinal: Denies dysphasia, heartburn, change in appetite, nausea, change in bowel habits, rectal bleeding, constipation, diarrhea, yellow skin or eyes Genitourinary: Denies frequency, urgency, burning/pain, blood in urine, incontinence, change in urinary strength. Musculoskeletal: Denies muscle/joint pain, stiffness, back pain, redness or swelling of joints, trauma Skin: Denies rashes, lumps, itching, dryness, color changes, or hair/nail changes Neurological: Denies dizziness, fainting, seizures, weakness, numbness, tingling, tremor Psychiatric - Denies nervousness, stress, depression or memory loss Endocrine: Denies heat or cold intolerance, sweating, frequent urination, excessive thirst, changes in appetite Hematologic: Denies ease of bruising or bleeding     Objective:  BP 110/72 (BP Location: Left Arm, Patient Position: Sitting, Cuff Size: Normal)   Pulse 76   Temp 98.5 F (36.9 C) (Oral)   Resp 16   Ht '5\' 8"'  (1.727 m)   Wt 154 lb 6.4 oz (70 kg)   SpO2 97%   BMI 23.48 kg/m  Nursing  note and vital signs reviewed.  Physical Exam  Constitutional: She is oriented to person, place, and time. She appears well-developed and well-nourished.  HENT:  Head: Normocephalic.  Right Ear: Hearing, tympanic membrane, external ear and ear canal normal.  Left Ear: Hearing, tympanic membrane, external ear and ear canal normal.  Nose: Nose normal.  Mouth/Throat: Uvula is midline, oropharynx is clear and moist and mucous membranes are normal.  Eyes: Conjunctivae and EOM are normal. Pupils are equal, round, and reactive to light.  Neck: Neck supple. No JVD present. No tracheal deviation present. No thyromegaly present.  Cardiovascular: Normal rate, regular rhythm, normal heart sounds and intact distal pulses.  Exam reveals no gallop and no friction rub.   No murmur heard. Pulmonary/Chest: Effort normal and breath sounds normal. No respiratory distress. She has no wheezes. She has no rales. She exhibits no tenderness.  Abdominal: Soft. Bowel sounds are normal. She exhibits no distension and no mass. There is no tenderness. There is no rebound and no guarding.  Musculoskeletal: Normal range of motion. She exhibits no edema or tenderness.  Lymphadenopathy:    She has no cervical adenopathy.  Neurological: She is alert and oriented to person, place, and time. She has normal reflexes. No cranial nerve deficit. She exhibits normal muscle tone. Coordination normal.  Skin: Skin is warm and dry.  Psychiatric: She has a normal mood and affect. Her behavior is normal. Judgment and thought content normal.       Assessment & Plan:   Problem List Items Addressed This Visit      Other   Pre-op exam    Patient requested pre-operative exam for medical clearance for partial knee replacement of the left knee. Allergies, medications, medical history, surgical history, and family/social history reviewed. Physical exam with no significant findings. Chronic conditions appear to adequately controlled with  medication regimen. Bridge coumadin to Lovenox. EKG for peri-operative clearance performed and independently interpreted showing sinus rhythm comparable to previous EKG. Obtain CBC, CMET and A1c. Surgical clearance pending blood work.       Relevant Orders   EKG 12-Lead (Completed)   Chronic pain of right knee - Primary    Chronic pain of right knee with no significant trauma having failed multiple conservative treatments with the new recommendation for surgical intervention given "bone on bone". Follow-up and surgical scheduling per orthopedics.       Relevant Orders   CBC (Completed)   Comp Met (CMET) (Completed)   Hemoglobin A1c (Completed)       I have discontinued Ms. Sheffler's cefUROXime, ciprofloxacin, enoxaparin, and triamcinolone cream. I am also having her maintain her buPROPion, levothyroxine, and warfarin.   Follow-up: Return if symptoms worsen or fail to improve.  Mauricio Po, FNP

## 2016-06-21 NOTE — Assessment & Plan Note (Signed)
Patient requested pre-operative exam for medical clearance for partial knee replacement of the left knee. Allergies, medications, medical history, surgical history, and family/social history reviewed. Physical exam with no significant findings. Chronic conditions appear to adequately controlled with medication regimen. Bridge coumadin to Lovenox. EKG for peri-operative clearance performed and independently interpreted showing sinus rhythm comparable to previous EKG. Obtain CBC, CMET and A1c. Surgical clearance pending blood work.

## 2016-06-23 ENCOUNTER — Other Ambulatory Visit: Payer: Self-pay | Admitting: General Practice

## 2016-06-23 ENCOUNTER — Telehealth: Payer: Self-pay | Admitting: General Practice

## 2016-06-23 MED ORDER — ENOXAPARIN SODIUM 100 MG/ML ~~LOC~~ SOLN: 100.0000 mg | SUBCUTANEOUS | 0 refills | Status: DC

## 2016-06-23 NOTE — Telephone Encounter (Signed)
[facility]  Lovenox has been dosed and sent to pharmacy.

## 2016-06-27 LAB — HM MAMMOGRAPHY

## 2016-06-27 LAB — HM PAP SMEAR: HM Pap smear: NEGATIVE

## 2016-06-27 LAB — TSH: TSH: 1.35 (ref 0.41–5.90)

## 2016-07-01 ENCOUNTER — Telehealth: Payer: Self-pay | Admitting: Family

## 2016-07-01 MED ORDER — ENOXAPARIN SODIUM 100 MG/ML ~~LOC~~ SOLN: 100.0000 mg | SUBCUTANEOUS | 0 refills | Status: DC

## 2016-07-01 NOTE — Telephone Encounter (Signed)
[facility] ([facility]) called to see where the enoxaparin (LOVENOX) 100 MG/ML injection should be sent.. To our office or the pt? They are also needing an ICD-10 code.

## 2016-07-01 NOTE — Telephone Encounter (Signed)
Returned call to [facility]. The hold time was too long. Sent in a new rx straight to [facility] with dx code and a note to send directly to pt.

## 2016-07-03 HISTORY — PX: OTHER SURGICAL HISTORY: SHX169

## 2016-07-04 ENCOUNTER — Other Ambulatory Visit: Payer: Self-pay | Admitting: General Practice

## 2016-07-04 MED ORDER — ENOXAPARIN SODIUM 100 MG/ML ~~LOC~~ SOLN: 100.0000 mg | SUBCUTANEOUS | 0 refills | Status: DC

## 2016-07-15 ENCOUNTER — Ambulatory Visit (INDEPENDENT_AMBULATORY_CARE_PROVIDER_SITE_OTHER): Payer: BLUE CROSS/BLUE SHIELD | Admitting: General Practice

## 2016-07-15 DIAGNOSIS — D6859 Other primary thrombophilia: Secondary | ICD-10-CM

## 2016-07-15 LAB — POCT INR: INR: 2.4

## 2016-07-15 NOTE — Patient Instructions (Signed)
Pre visit review using our clinic review tool, if applicable. No additional management support is needed unless otherwise documented below in the visit note. 

## 2016-07-19 DIAGNOSIS — Z96652 Presence of left artificial knee joint: Secondary | ICD-10-CM | POA: Insufficient documentation

## 2016-07-28 ENCOUNTER — Emergency Department (HOSPITAL_COMMUNITY)
Admission: EM | Admit: 2016-07-28 | Discharge: 2016-07-29 | Disposition: A | Discharge status: Home / Self Care | Payer: BLUE CROSS/BLUE SHIELD | Attending: Emergency Medicine | Admitting: Emergency Medicine

## 2016-07-28 ENCOUNTER — Emergency Department (HOSPITAL_COMMUNITY): Payer: BLUE CROSS/BLUE SHIELD

## 2016-07-28 DIAGNOSIS — R1013 Epigastric pain: Secondary | ICD-10-CM | POA: Insufficient documentation

## 2016-07-28 DIAGNOSIS — R0789 Other chest pain: Secondary | ICD-10-CM | POA: Diagnosis present

## 2016-07-28 DIAGNOSIS — R0602 Shortness of breath: Secondary | ICD-10-CM | POA: Insufficient documentation

## 2016-07-28 DIAGNOSIS — Z7901 Long term (current) use of anticoagulants: Secondary | ICD-10-CM | POA: Insufficient documentation

## 2016-07-28 DIAGNOSIS — Z96652 Presence of left artificial knee joint: Secondary | ICD-10-CM | POA: Diagnosis not present

## 2016-07-28 DIAGNOSIS — R110 Nausea: Secondary | ICD-10-CM | POA: Diagnosis not present

## 2016-07-28 LAB — PROTIME-INR
INR: 2.67
Prothrombin Time: 28.9 seconds — ABNORMAL HIGH (ref 11.4–15.2)

## 2016-07-28 LAB — CBC WITH DIFFERENTIAL/PLATELET
BASOS ABS: 0 10*3/uL (ref 0.0–0.1)
Basophils Relative: 0 %
EOS PCT: 1 %
Eosinophils Absolute: 0.1 10*3/uL (ref 0.0–0.7)
HEMATOCRIT: 38.1 % (ref 36.0–46.0)
Hemoglobin: 13.2 g/dL (ref 12.0–15.0)
LYMPHS ABS: 1.3 10*3/uL (ref 0.7–4.0)
LYMPHS PCT: 19 %
MCH: 31 pg (ref 26.0–34.0)
MCHC: 34.6 g/dL (ref 30.0–36.0)
MCV: 89.4 fL (ref 78.0–100.0)
MONO ABS: 0.9 10*3/uL (ref 0.1–1.0)
MONOS PCT: 14 %
NEUTROS ABS: 4.5 10*3/uL (ref 1.7–7.7)
Neutrophils Relative %: 66 %
PLATELETS: 255 10*3/uL (ref 150–400)
RBC: 4.26 MIL/uL (ref 3.87–5.11)
RDW: 12.5 % (ref 11.5–15.5)
WBC: 6.8 10*3/uL (ref 4.0–10.5)

## 2016-07-28 LAB — I-STAT TROPONIN, ED: TROPONIN I, POC: 0.01 ng/mL (ref 0.00–0.08)

## 2016-07-28 NOTE — ED Triage Notes (Signed)
Pt reports that 4 days ago  During therapy had abdominal pain that radiated to mid sternal to left sided chest pain. Pt reports having chest pain  today lasting 45 minutes crushing, pressure.  2/10, with associated SOB and some mild nausea. Pt states episode occurred  while resting. Pt has recently had left knee replacement 7/09 and is warfarin 6mg 

## 2016-07-28 NOTE — ED Provider Notes (Signed)
[facility]-EMERGENCY DEPT Provider Note   CSN: [id number] Arrival date & time: 07/28/16  2224     History   Chief Complaint Chief Complaint  Patient presents with  . Chest Pain    HPI Melinda Benton is a 56 y.o. female.  The history is provided by the patient.  Chest Pain    She has a history of hypercoagulable state, and is 2.5 weeks post left partial knee replacement. 3 days ago, she had an episode of a sharp pain in the epigastric area which radiated into the chest were was a pressure feeling. Pain lasted about 45 minutes before resolving. There is associated dyspnea, no nausea or diaphoresis. She had recurrence of this same pain at about 9:30 tonight. When pain was present, nothing made it better and nothing made it worse. She is completely pain-free now. Pain was rated at 8/10 at its worst. First episode occurred while she was at physical therapy, tonight's episode occurred while she was watching television. She is on warfarin and last INR was 2 weeks ago. She is a nonsmoker and no history of diabetes or hypertension or hyperlipidemia. There is a positive family history of premature coronary atherosclerosis.  Past Medical History:  Diagnosis Date  . Arthritis   . H/O blood clots   . History of chicken pox   . Migraines   . Mitral valve prolapse   . Thyroid disease     Patient Active Problem List   Diagnosis Date Noted  . Chronic pain of right knee 06/21/2016  . Spider varicose veins 09/01/2015  . Cough 05/19/2014  . Palpitations 10/29/2013  . Arthritis 10/09/2013  . Pre-op exam 10/09/2013  . Pulmonary embolus (HCC) 02/15/2010  . Hypercoagulable state (HCC) 02/15/2010    Past Surgical History:  Procedure Laterality Date  . ELBOW SURGERY  2009  . ROTATOR CUFF REPAIR  2011  . TONSILLECTOMY  ?1968    OB History    No data available       Home Medications    Prior to Admission medications   Medication Sig Start Date End Date Taking? Authorizing Provider    buPROPion (WELLBUTRIN XL) 300 MG 24 hr tablet Take 300 mg by mouth daily.    [provider]  enoxaparin (LOVENOX) 100 MG/ML injection Inject 1 mL (100 mg total) into the skin daily. 07/04/16   Veryl Speakalone, Gregory D, FNP  levothyroxine (SYNTHROID, LEVOTHROID) 75 MCG tablet  08/04/15   [provider]  warfarin (COUMADIN) 6 MG tablet TAKE AS DIRECTED BY ANTICOAGULATION CLINIC 06/13/16   Myrlene Brokerrawford, Elizabeth A, MD    Family History Family History  Problem Relation Age of Onset  . Protein S deficiency Mother   . Heart disease Father   . Diabetes Father   . Colon cancer Paternal Grandfather   . Stroke Brother   . Diabetes Paternal Aunt   . Diabetes Paternal Uncle   . Colon cancer Paternal Grandmother     Social History Social History  Substance Use Topics  . Smoking status: Never Smoker  . Smokeless tobacco: Never Used  . Alcohol use Yes     Comment: Socially     Allergies   Patient has no known allergies.   Review of Systems Review of Systems  Cardiovascular: Positive for chest pain.  All other systems reviewed and are negative.    Physical Exam Updated Vital Signs BP 138/78   Pulse 70   Temp 97.9 F (36.6 C) (Oral)   Resp 14  Ht 5' 8.5" (1.74 m)   Wt 68.9 kg (152 lb)   SpO2 98%   BMI 22.78 kg/m   Physical Exam  Nursing note and vitals reviewed.  56 year old female, resting comfortably and in no acute distress. Vital signs are normal. Oxygen saturation is 98%, which is normal. Head is normocephalic and atraumatic. PERRLA, EOMI. Oropharynx is clear. Neck is nontender and supple without adenopathy or JVD. Back is nontender and there is no CVA tenderness. Lungs are clear without rales, wheezes, or rhonchi. Chest is nontender. Heart has regular rate and rhythm without murmur. Abdomen is soft, flat, nontender without masses or hepatosplenomegaly and peristalsis is normoactive. Extremities: Dressing is present over her left knee and not removed, but  there is no swelling of the knee. There is no peripheral edema. Skin is warm and dry without rash. Neurologic: Mental status is normal, cranial nerves are intact, there are no motor or sensory deficits.  ED Treatments / Results  Labs (all labs ordered are listed, but only abnormal results are displayed) Labs Reviewed  COMPREHENSIVE METABOLIC PANEL - Abnormal; Notable for the following:       Result Value   Glucose, Bld 113 (*)    AST 43 (*)    All other components within normal limits  PROTIME-INR - Abnormal; Notable for the following:    Prothrombin Time 28.9 (*)    All other components within normal limits  LIPASE, BLOOD  CBC WITH DIFFERENTIAL/PLATELET  I-STAT TROPONIN, ED    EKG  EKG Interpretation  Date/Time:  Thursday July 28 2016 22:30:30 EDT Ventricular Rate:  72 PR Interval:    QRS Duration: 86 QT Interval:  393 QTC Calculation: 431 R Axis:   93 Text Interpretation:  Sinus rhythm Borderline right axis deviation Anteroseptal infarct, old When compared with ECG of 12/14/2004, No significant change was found Confirmed by Dione Booze ([id number]) on 07/28/2016 11:10:48 PM       EKG Interpretation  Date/Time:  Friday July 29 2016 00:26:05 EDT Ventricular Rate:  87 PR Interval:    QRS Duration: 98 QT Interval:  394 QTC Calculation: 474 R Axis:   97 Text Interpretation:  Sinus rhythm Borderline prolonged PR interval Anteroseptal infarct, age indeterminate When compared with ECG of 07/28/2016, No significant change was found Confirmed by Dione Booze ([id number]) on 07/29/2016 12:32:46 AM        Radiology Dg Chest 2 View  Result Date: 07/28/2016 CLINICAL DATA:  Left-sided chest pain EXAM: CHEST  2 VIEW COMPARISON:  None. FINDINGS: The heart size and mediastinal contours are within normal limits. Both lungs are clear. The visualized skeletal structures are unremarkable. IMPRESSION: No active cardiopulmonary disease. Electronically Signed   By: Jasmine Pang M.D.   On:  07/28/2016 23:59   US Abdomen Complete  Result Date: 07/29/2016 CLINICAL DATA:  Abdominal pain radiating throughout the abdomen. Nausea and vomiting. EXAM: ABDOMEN ULTRASOUND COMPLETE COMPARISON:  None. FINDINGS: Gallbladder: The gallbladder is somewhat distended but no wall thickening, stones, or sludge are demonstrated. There is a small nonmobile echogenic focus consistent with a polyp measuring about 2 mm diameter. Based on size criteria, this is likely benign. Murphy's sign is negative. Common bile duct: Diameter: 5 mm, normal Liver: Simple appearing cysts in the liver measuring 3 cm maximal diameter. No other focal liver lesions. IVC: No abnormality visualized. Pancreas: Visualized portion unremarkable. Spleen: Size and appearance within normal limits. Right Kidney: Length: 10.3 cm. Echogenicity within normal limits. No mass or hydronephrosis visualized. Left Kidney:  Length: 12 cm. Echogenicity within normal limits. No mass or hydronephrosis visualized. Abdominal aorta: No aneurysm visualized. Other findings: None. IMPRESSION: 1. Mild gallbladder distention without evidence of cholelithiasis or cholecystitis. 2. 2 mm gallbladder polyp, likely benign. 3. Benign-appearing cyst in the liver. Electronically Signed   By: Burman NievesWilliam  Stevens M.D.   On: 07/29/2016 01:45    Procedures Procedures (including critical care time)  Medications Ordered in ED Medications  dicyclomine (BENTYL) capsule 20 mg (not administered)  pantoprazole (PROTONIX) EC tablet 40 mg (not administered)  gi cocktail (Maalox,Lidocaine,Donnatal) (30 mLs Oral Given 07/29/16 0031)  ondansetron (ZOFRAN) injection 4 mg (4 mg Intravenous Given 07/29/16 0038)     Initial Impression / Assessment and Plan / ED Course  I have reviewed the triage vital signs and the nursing notes.  Pertinent labs & imaging results that were available during my care of the patient were reviewed by me and considered in my medical decision making (see chart  for details).  Atypical chest pain of uncertain cause. Old records are reviewed confirming history of hypercoagulable state with prior pulmonary embolism. 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 INR on July 13 was 2.4. Current episode does not sound like a pulmonary embolism, and she has normal heart rate and oxygen saturation. Heart score = 2, which puts her at a very low risk of major adverse cardiac events in the next 30 days. Will recheck INR. If therapeutic, I do not feel she needs CT angiogram with atypical sounding history. Cause of her pain is unclear.  12:30 AM She had recurrence of her pain. On reexam, she is obviously uncomfortable. There is significant epigastric and right upper quadrant tenderness. Laboratory evaluation was unremarkable. Minimal elevation of AST is not felt to be clinically significant. ECG obtained during pain did not show any ST or T changes. She will be sent for her abdominal ultrasound. She is given a dose of a GI cocktail.  2:10 AM She had excellent relief of pain with the GI cocktail. Abdominal ultrasound shows a gallbladder polyp, but no evidence of cholelithiasis or cholecystitis. At this point, pain seems most likely to be due to esophageal spasm. She is discharged with prescriptions for dicyclomine, pantoprazole, ondansetron. She is referred to her gastroneurologist for further outpatient evaluation.  Final Clinical Impressions(s) / ED Diagnoses   Final diagnoses:  Epigastric pain  Anticoagulated on warfarin    New Prescriptions New Prescriptions   DICYCLOMINE (BENTYL) 20 MG TABLET    Take 1 tablet (20 mg total) by mouth 4 (four) times daily -  before meals and at bedtime.   ONDANSETRON (ZOFRAN) 4 MG TABLET    Take 1 tablet (4 mg total) by mouth every 6 (six) hours as needed for nausea.   PANTOPRAZOLE (PROTONIX) 40 MG TABLET    Take 1 tablet (40 mg total) by mouth daily.       Dione BoozeGlick, Farren Nelles, MD 07/29/16 [phone]

## 2016-07-28 NOTE — ED Notes (Signed)
Bed: WA06 Expected date:  Expected time:  Means of arrival:  Comments: 

## 2016-07-29 ENCOUNTER — Emergency Department (HOSPITAL_COMMUNITY): Payer: BLUE CROSS/BLUE SHIELD

## 2016-07-29 LAB — COMPREHENSIVE METABOLIC PANEL
ALK PHOS: 63 U/L (ref 38–126)
ALT: 31 U/L (ref 14–54)
ANION GAP: 7 (ref 5–15)
AST: 43 U/L — ABNORMAL HIGH (ref 15–41)
Albumin: 3.9 g/dL (ref 3.5–5.0)
BILIRUBIN TOTAL: 0.3 mg/dL (ref 0.3–1.2)
BUN: 19 mg/dL (ref 6–20)
CALCIUM: 9.1 mg/dL (ref 8.9–10.3)
CO2: 29 mmol/L (ref 22–32)
Chloride: 104 mmol/L (ref 101–111)
Creatinine, Ser: 0.84 mg/dL (ref 0.44–1.00)
GFR calc non Af Amer: >60 mL/min (ref >60)
Glucose, Bld: 113 mg/dL — ABNORMAL HIGH (ref 65–99)
POTASSIUM: 3.5 mmol/L (ref 3.5–5.1)
SODIUM: 140 mmol/L (ref 135–145)
TOTAL PROTEIN: 6.6 g/dL (ref 6.5–8.1)

## 2016-07-29 LAB — LIPASE, BLOOD: Lipase: 43 U/L (ref 11–51)

## 2016-07-29 MED ORDER — DICYCLOMINE HCL 10 MG PO CAPS
20.0000 mg | ORAL_CAPSULE | Freq: Once | ORAL | Status: AC
  Administered 2016-07-29: 20 mg via ORAL
  Filled 2016-07-29: qty 2

## 2016-07-29 MED ORDER — PANTOPRAZOLE SODIUM 40 MG PO TBEC
40.0000 mg | DELAYED_RELEASE_TABLET | Freq: Once | ORAL | Status: AC
  Administered 2016-07-29: 40 mg via ORAL
  Filled 2016-07-29: qty 1

## 2016-07-29 MED ORDER — GI COCKTAIL ~~LOC~~
30.0000 mL | Freq: Once | ORAL | Status: AC
  Administered 2016-07-29: 30 mL via ORAL
  Filled 2016-07-29: qty 30

## 2016-07-29 MED ORDER — ONDANSETRON HCL 4 MG PO TABS: 4.0000 mg | ORAL_TABLET | Freq: Four times a day (QID) | ORAL | 0 refills | Status: DC | PRN

## 2016-07-29 MED ORDER — ONDANSETRON HCL 4 MG/2ML IJ SOLN
INTRAMUSCULAR | Status: AC
  Administered 2016-07-29: 4 mg via INTRAVENOUS
  Filled 2016-07-29: qty 2

## 2016-07-29 MED ORDER — PANTOPRAZOLE SODIUM 40 MG PO TBEC: 40.0000 mg | DELAYED_RELEASE_TABLET | Freq: Every day | ORAL | 0 refills | Status: DC

## 2016-07-29 MED ORDER — ONDANSETRON HCL 4 MG/2ML IJ SOLN
4.0000 mg | Freq: Once | INTRAMUSCULAR | Status: AC
  Administered 2016-07-29: 4 mg via INTRAVENOUS

## 2016-07-29 MED ORDER — DICYCLOMINE HCL 20 MG PO TABS: 20.0000 mg | ORAL_TABLET | Freq: Three times a day (TID) | ORAL | 0 refills | Status: DC

## 2016-07-29 NOTE — Discharge Instructions (Signed)
Return if you are having any problems. 

## 2016-07-29 NOTE — ED Notes (Signed)
Pt signed but signature pad not working. 

## 2016-07-29 NOTE — ED Notes (Signed)
Pt reports severe mid epigastric pain. Dr. Preston FleetingGlick notified.

## 2016-07-29 NOTE — ED Notes (Signed)
EKG given to EDP,Glick,MD., for review. 

## 2016-08-12 ENCOUNTER — Ambulatory Visit (INDEPENDENT_AMBULATORY_CARE_PROVIDER_SITE_OTHER): Payer: BLUE CROSS/BLUE SHIELD | Admitting: General Practice

## 2016-08-12 ENCOUNTER — Ambulatory Visit: Payer: BLUE CROSS/BLUE SHIELD

## 2016-08-12 DIAGNOSIS — D6859 Other primary thrombophilia: Secondary | ICD-10-CM

## 2016-08-12 LAB — POCT INR: INR: 1.8

## 2016-08-12 NOTE — Progress Notes (Signed)
I have reviewed and agree with the plan. 

## 2016-08-12 NOTE — Patient Instructions (Signed)
Pre visit review using our clinic review tool, if applicable. No additional management support is needed unless otherwise documented below in the visit note. 

## 2016-09-08 ENCOUNTER — Other Ambulatory Visit: Payer: Self-pay | Admitting: Internal Medicine

## 2016-09-08 ENCOUNTER — Other Ambulatory Visit: Payer: Self-pay | Admitting: General Practice

## 2016-09-08 MED ORDER — WARFARIN SODIUM 6 MG PO TABS: ORAL_TABLET | ORAL | 0 refills | Status: DC

## 2016-09-08 NOTE — Telephone Encounter (Signed)
Please advise thanks.

## 2016-09-08 NOTE — Patient Instructions (Signed)
Pre visit review using our clinic review tool, if applicable. No additional management support is needed unless otherwise documented below in the visit note. 

## 2016-09-09 ENCOUNTER — Ambulatory Visit (INDEPENDENT_AMBULATORY_CARE_PROVIDER_SITE_OTHER): Payer: BLUE CROSS/BLUE SHIELD | Admitting: General Practice

## 2016-09-09 DIAGNOSIS — D6859 Other primary thrombophilia: Secondary | ICD-10-CM

## 2016-09-09 LAB — POCT INR: INR: 2.9

## 2016-10-20 ENCOUNTER — Other Ambulatory Visit (HOSPITAL_COMMUNITY): Payer: Self-pay | Admitting: Endocrinology

## 2016-10-20 DIAGNOSIS — E041 Nontoxic single thyroid nodule: Secondary | ICD-10-CM

## 2016-10-21 ENCOUNTER — Ambulatory Visit: Payer: BLUE CROSS/BLUE SHIELD

## 2016-10-21 ENCOUNTER — Ambulatory Visit (INDEPENDENT_AMBULATORY_CARE_PROVIDER_SITE_OTHER): Payer: BLUE CROSS/BLUE SHIELD | Admitting: General Practice

## 2016-10-21 DIAGNOSIS — D6859 Other primary thrombophilia: Secondary | ICD-10-CM

## 2016-10-21 LAB — POCT INR: INR: 1.8

## 2016-10-21 NOTE — Patient Instructions (Signed)
Pre visit review using our clinic review tool, if applicable. No additional management support is needed unless otherwise documented below in the visit note. 

## 2016-11-29 ENCOUNTER — Telehealth: Payer: Self-pay | Admitting: General Practice

## 2016-11-29 NOTE — Telephone Encounter (Signed)
LMOVM for patient to reschedule appointment.

## 2016-11-29 NOTE — Telephone Encounter (Signed)
LMOVM to call and reschedule appointment for 12/7.

## 2016-12-01 ENCOUNTER — Encounter (HOSPITAL_COMMUNITY)
Admission: RE | Admit: 2016-12-01 | Discharge: 2016-12-01 | Disposition: A | Discharge status: Home / Self Care | Payer: BLUE CROSS/BLUE SHIELD | Source: Ambulatory Visit | Attending: Endocrinology | Admitting: Endocrinology

## 2016-12-01 DIAGNOSIS — E041 Nontoxic single thyroid nodule: Secondary | ICD-10-CM | POA: Insufficient documentation

## 2016-12-02 ENCOUNTER — Ambulatory Visit: Payer: BLUE CROSS/BLUE SHIELD

## 2016-12-09 ENCOUNTER — Ambulatory Visit: Payer: BLUE CROSS/BLUE SHIELD

## 2016-12-12 ENCOUNTER — Other Ambulatory Visit: Payer: Self-pay | Admitting: Family

## 2016-12-13 ENCOUNTER — Ambulatory Visit: Payer: BLUE CROSS/BLUE SHIELD

## 2017-01-11 ENCOUNTER — Emergency Department (HOSPITAL_COMMUNITY): Payer: No Typology Code available for payment source

## 2017-01-11 ENCOUNTER — Emergency Department (HOSPITAL_COMMUNITY)
Admission: EM | Admit: 2017-01-11 | Discharge: 2017-01-11 | Disposition: A | Discharge status: Home / Self Care | Payer: No Typology Code available for payment source | Attending: Emergency Medicine | Admitting: Emergency Medicine

## 2017-01-11 ENCOUNTER — Encounter (HOSPITAL_COMMUNITY): Payer: Self-pay

## 2017-01-11 ENCOUNTER — Other Ambulatory Visit: Payer: Self-pay

## 2017-01-11 ENCOUNTER — Other Ambulatory Visit: Payer: Self-pay | Admitting: Medical

## 2017-01-11 DIAGNOSIS — Z79899 Other long term (current) drug therapy: Secondary | ICD-10-CM | POA: Insufficient documentation

## 2017-01-11 DIAGNOSIS — I4892 Unspecified atrial flutter: Secondary | ICD-10-CM | POA: Diagnosis not present

## 2017-01-11 DIAGNOSIS — Z7901 Long term (current) use of anticoagulants: Secondary | ICD-10-CM | POA: Insufficient documentation

## 2017-01-11 DIAGNOSIS — R079 Chest pain, unspecified: Secondary | ICD-10-CM | POA: Diagnosis present

## 2017-01-11 LAB — BASIC METABOLIC PANEL
ANION GAP: 8 (ref 5–15)
BUN: 12 mg/dL (ref 6–20)
CALCIUM: 9.5 mg/dL (ref 8.9–10.3)
CO2: 24 mmol/L (ref 22–32)
CREATININE: 1.03 mg/dL — AB (ref 0.44–1.00)
Chloride: 109 mmol/L (ref 101–111)
GFR, EST NON AFRICAN AMERICAN: 60 mL/min — AB (ref >60)
Glucose, Bld: 120 mg/dL — ABNORMAL HIGH (ref 65–99)
Potassium: 4.1 mmol/L (ref 3.5–5.1)
SODIUM: 141 mmol/L (ref 135–145)

## 2017-01-11 LAB — CBC
HCT: 46 % (ref 36.0–46.0)
Hemoglobin: 15.6 g/dL — ABNORMAL HIGH (ref 12.0–15.0)
MCH: 30.2 pg (ref 26.0–34.0)
MCHC: 33.9 g/dL (ref 30.0–36.0)
MCV: 89 fL (ref 78.0–100.0)
PLATELETS: 233 10*3/uL (ref 150–400)
RBC: 5.17 MIL/uL — AB (ref 3.87–5.11)
RDW: 12.3 % (ref 11.5–15.5)
WBC: 5.1 10*3/uL (ref 4.0–10.5)

## 2017-01-11 LAB — I-STAT TROPONIN, ED
TROPONIN I, POC: 0 ng/mL (ref 0.00–0.08)
Troponin i, poc: 0.01 ng/mL (ref 0.00–0.08)

## 2017-01-11 LAB — I-STAT BETA HCG BLOOD, ED (MC, WL, AP ONLY)

## 2017-01-11 LAB — PROTIME-INR
INR: 1.33
Prothrombin Time: 16.3 seconds — ABNORMAL HIGH (ref 11.4–15.2)

## 2017-01-11 LAB — D-DIMER, QUANTITATIVE: D-Dimer, Quant: 0.46 ug/mL-FEU (ref 0.00–0.50)

## 2017-01-11 LAB — TSH: TSH: 1.091 u[IU]/mL (ref 0.350–4.500)

## 2017-01-11 LAB — MAGNESIUM: Magnesium: 1.9 mg/dL (ref 1.7–2.4)

## 2017-01-11 MED ORDER — IOPAMIDOL (ISOVUE-370) INJECTION 76%
100.0000 mL | Freq: Once | INTRAVENOUS | Status: AC | PRN
  Administered 2017-01-11: 100 mL via INTRAVENOUS

## 2017-01-11 MED ORDER — METOPROLOL SUCCINATE ER 25 MG PO TB24: 25.0000 mg | ORAL_TABLET | Freq: Every day | ORAL | 0 refills | Status: DC

## 2017-01-11 MED ORDER — SODIUM CHLORIDE 0.9 % IV SOLN
INTRAVENOUS | Status: AC | PRN
  Administered 2017-01-11: 1000 mL via INTRAVENOUS

## 2017-01-11 MED ORDER — IOPAMIDOL (ISOVUE-370) INJECTION 76%
INTRAVENOUS | Status: DC
Start: 2017-01-11 — End: 2017-01-11
  Filled 2017-01-11: qty 100

## 2017-01-11 MED ORDER — ENOXAPARIN SODIUM 80 MG/0.8ML ~~LOC~~ SOLN
70.0000 mg | Freq: Two times a day (BID) | SUBCUTANEOUS | Status: DC
  Filled 2017-01-11: qty 0.7

## 2017-01-11 MED ORDER — SODIUM CHLORIDE 0.9% FLUSH: 3.0000 mL | INTRAVENOUS | Status: DC | PRN

## 2017-01-11 MED ORDER — SODIUM CHLORIDE 0.9 % IV SOLN
250.0000 mL | INTRAVENOUS | Status: DC
  Administered 2017-01-11: 250 mL via INTRAVENOUS

## 2017-01-11 MED ORDER — ETOMIDATE 2 MG/ML IV SOLN
8.0000 mg | Freq: Once | INTRAVENOUS | Status: AC
  Administered 2017-01-11: 8 mg via INTRAVENOUS
  Filled 2017-01-11: qty 10

## 2017-01-11 MED ORDER — ENOXAPARIN SODIUM 80 MG/0.8ML ~~LOC~~ SOLN
70.0000 mg | SUBCUTANEOUS | Status: AC
  Administered 2017-01-11: 12:00:00 70 mg via SUBCUTANEOUS
  Filled 2017-01-11: qty 0.7

## 2017-01-11 MED ORDER — SODIUM CHLORIDE 0.9% FLUSH: 3.0000 mL | Freq: Two times a day (BID) | INTRAVENOUS | Status: DC

## 2017-01-11 MED ORDER — MIDAZOLAM HCL 2 MG/2ML IJ SOLN
2.0000 mg | Freq: Once | INTRAMUSCULAR | Status: AC
  Administered 2017-01-11: 2 mg via INTRAVENOUS
  Filled 2017-01-11: qty 2

## 2017-01-11 MED ORDER — SODIUM CHLORIDE 0.9 % IV BOLUS (SEPSIS)
1000.0000 mL | Freq: Once | INTRAVENOUS | Status: AC
  Administered 2017-01-11: 1000 mL via INTRAVENOUS

## 2017-01-11 MED ORDER — FLECAINIDE ACETATE 100 MG PO TABS
300.0000 mg | ORAL_TABLET | Freq: Once | ORAL | Status: AC
  Administered 2017-01-11: 300 mg via ORAL
  Filled 2017-01-11: qty 3

## 2017-01-11 MED ORDER — METOPROLOL SUCCINATE ER 25 MG PO TB24
25.0000 mg | ORAL_TABLET | Freq: Every day | ORAL | Status: DC
  Administered 2017-01-11: 25 mg via ORAL
  Filled 2017-01-11: qty 1

## 2017-01-11 NOTE — Consult Note (Signed)
Cardiology Consultation:   Patient ID: Melinda Benton; [id number]; 08/17/1960   Admit date: 01/11/2017 Date of Consult: 01/11/2017  Primary Care Provider: No primary care provider on file. Primary Cardiologist: Lewayne Bunting, MD  Primary Electrophysiologist:  GT   Patient Profile:   Melinda Benton is a 57 y.o. 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 Rhunette Croft.  History of Present Illness:   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  Also 48 hour monitor with frequent PACs, PVCs, as well as brief bursts of atrial tachycardia lasting seconds at rates of between 140 and 150 bpm.  Today she presents to ER with HR 130s and + chest tightness. This began at 0100, she is back from [place] and is not back on usual sleep pattern. When it started she had some chest tightness and this continues.  She did not rest but was planning to go to [place] after meeting this AM.  She tried to leave house but was weak and did not feel she could do much.  She came to ER.  EKG:  I personally reviewed  The EKG was personally reviewed and demonstrates:  Initial EKG with Sinus tach otherwise similar to previous tracings, with S waves in V1-2  Other EKGs look more a flutter Telemetry:  Telemetry was personally reviewed and demonstrates:  Atrial flutter  Troponin 0.00 to 0.01  K+ 4.1 Cr 1.03  Hgb 15.6  DDimer 0.46 INR 1.33--with recent trip she forgot to take coumadin for 1-2 nights. TSH 1.091   CT chest with neg for PE.   2 V chest no acute cardiopulmonary process.  Still with a flutter, HR 124 occ pause with more flutter waves.still with some chest discomfort.  She has  rec'd lovenox with low INR.   Past Medical History:  Diagnosis Date  . Arthritis   . H/O blood clots   . History of chicken pox   . Migraines   . Mitral valve prolapse   . Thyroid disease     Past Surgical History:  Procedure Laterality Date  . ELBOW SURGERY  2009  . ROTATOR CUFF REPAIR  2011  . TONSILLECTOMY  ?1968     Home Medications:  Prior to Admission medications   Medication Sig Start Date End Date Taking? Authorizing Provider  buPROPion (WELLBUTRIN XL) 300 MG 24 hr tablet Take 300 mg by mouth daily.   Yes [provider]  levothyroxine (SYNTHROID, LEVOTHROID) 75 MCG tablet Take 75 mcg by mouth daily before breakfast.  08/04/15  Yes [provider]  warfarin (COUMADIN) 6 MG tablet Take 1 tablet daily or as directed by anticoagulation clinic.  90 day 09/08/16  Yes Veryl Speak, FNP  dicyclomine (BENTYL) 20 MG tablet Take 1 tablet (20 mg total) by mouth 4 (four) times daily -  before meals and at bedtime. Patient not taking: Reported on 01/11/2017 07/29/16   Dione Booze, MD  ondansetron (ZOFRAN) 4 MG tablet Take 1 tablet (4 mg total) by mouth every 6 (six) hours as needed for nausea. Patient not taking: Reported on 01/11/2017 07/29/16   Dione Booze, MD  pantoprazole (PROTONIX) 40 MG tablet Take 1  tablet (40 mg total) by mouth daily. Patient not taking: Reported on 01/11/2017 07/29/16   Dione Booze, MD    Inpatient Medications: Scheduled Meds: . enoxaparin (LOVENOX) injection  70 mg Subcutaneous Q12H  . iopamidol       Continuous Infusions:  PRN Meds:   Allergies:   No Known Allergies  Social History:   Social History   Socioeconomic History  . Marital status: Married    Spouse name: Not on file  . Number of children: 3  . Years of education: 57  . Highest education level: Not on file  Social Needs  . Financial resource strain: Not on file  . Food insecurity - worry: Not on file  . Food insecurity - inability: Not on file  . Transportation  needs - medical: Not on file  . Transportation needs - non-medical: Not on file  Occupational History  . Occupation: Self Employed    Employer: UNEMPLOYED  Tobacco Use  . Smoking status: Never Smoker  . Smokeless tobacco: Never Used  Substance and Sexual Activity  . Alcohol use: Yes    Comment: Socially  . Drug use: No  . Sexual activity: Yes  Other Topics Concern  . Not on file  Social History Narrative   Regular exercise-yes   Caffeine Use-yes    Family History:    Family History  Problem Relation Age of Onset  . Protein S deficiency Mother   . Heart disease Father   . Diabetes Father   . Colon cancer Paternal Grandfather   . Stroke Brother   . Diabetes Paternal Aunt   . Diabetes Paternal Uncle   . Colon cancer Paternal Grandmother      ROS:  Please see the history of present illness.  ROS  General:no colds or fevers, no weight changes Skin:no rashes or ulcers HEENT:no blurred vision, no congestion CV:see HPI PUL:see HPI GI:no diarrhea constipation or melena, no indigestion GU:no hematuria, no dysuria MS:no joint pain, no claudication Neuro:no syncope, no lightheadedness Endo:no diabetes, + thyroid disease   Physical Exam/Data:   Vitals:   01/11/17 1030 01/11/17 1032 01/11/17 1131 01/11/17 1217  BP: 100/84 105/71 100/84 101/70  Pulse: (!) 124 (!) 125 (!) 124 96  Resp: 13 12 (!) 23 12  Temp:      TempSrc:      SpO2: 98% 99% 100% 100%    Intake/Output Summary (Last 24 hours) at 01/11/2017 1330 Last data filed at 01/11/2017 1130 Gross per 24 hour  Intake 1000 ml  Output -  Net 1000 ml   There were no vitals filed for this visit. There is no height or weight on file to calculate BMI.  General:  Well nourished, well developed, in no acute distress though resting in bed HEENT: normal Lymph: no adenopathy Neck: no JVD Endocrine:  No thryomegaly Vascular: No carotid bruits; FA pulses 2+ bilaterally without bruits  Cardiac:  normal S1, S2 rapid; RRR; no  murmur gallup rub or click Lungs:  clear to auscultation bilaterally, no wheezing, rhonchi or rales  Abd: soft, nontender, no hepatomegaly  Ext: no edema Musculoskeletal:  No deformities, BUE and BLE strength normal and equal Skin: warm and dry  Neuro:  Alert and oriented X 3 MAE follows commands, no focal abnormalities noted Psych:  Normal affect   Relevant CV Studies: Echo 2015  Study Conclusions  - Left ventricle: The cavity size was normal. Systolic function was vigorous. The estimated ejection fraction was in the range of 65% to  70%. Wall motion was normal; there were no regional wall motion abnormalities. Doppler parameters are consistent with abnormal left ventricular relaxation (grade 1 diastolic dysfunction). - Aortic valve: There was no regurgitation. - Aortic root: The aortic root was normal in size. - Mitral valve: There was trivial regurgitation. - Left atrium: The atrium was normal in size. - Right ventricle: The cavity size was normal. Wall thickness was normal. Systolic function was normal. - Tricuspid valve: There was mild regurgitation. - Pulmonic valve: There was no regurgitation. - Pulmonary arteries: Systolic pressure was within the normal range. - Pericardium, extracardiac: A trivial pericardial effusion was identified posterior to the heart.  Impressions:  - Normal biventricular size and systolic function. Abnormal relaxation with normal filling pressures. Normal RVSP. No evidence of right sided pressure or volume overload.. Laboratory Data:  Chemistry Recent Labs  Lab 01/11/17 0726  NA 141  K 4.1  CL 109  CO2 24  GLUCOSE 120*  BUN 12  CREATININE 1.03*  CALCIUM 9.5  GFRNONAA 60*  GFRAA >60  ANIONGAP 8    No results for input(s): PROT, ALBUMIN, AST, ALT, ALKPHOS, BILITOT in the last 168 hours. Hematology Recent Labs  Lab 01/11/17 0726  WBC 5.1  RBC 5.17*  HGB 15.6*  HCT 46.0  MCV 89.0  MCH 30.2  MCHC 33.9    RDW 12.3  PLT 233   Cardiac EnzymesNo results for input(s): TROPONINI in the last 168 hours.  Recent Labs  Lab 01/11/17 0741 01/11/17 1039  TROPIPOC 0.00 0.01    BNPNo results for input(s): BNP, PROBNP in the last 168 hours.  DDimer  Recent Labs  Lab 01/11/17 0726  DDIMER 0.46    Radiology/Studies:  Dg Chest 2 View  Result Date: 01/11/2017 CLINICAL DATA:  Shortness of breath and chest pain EXAM: CHEST  2 VIEW COMPARISON:  July 28, 2016 FINDINGS: Lungs are clear. Heart size and pulmonary vascularity are normal. No adenopathy. No pneumothorax. No bone lesions. IMPRESSION: No edema or consolidation. Electronically Signed   By: Bretta Bang III M.D.   On: 01/11/2017 08:07   Ct Angio Chest Pe W And/or Wo Contrast  Result Date: 01/11/2017 CLINICAL DATA:  Tachycardia and chest pain since this morning. EXAM: CT ANGIOGRAPHY CHEST WITH CONTRAST TECHNIQUE: Multidetector CT imaging of the chest was performed using the standard protocol during bolus administration of intravenous contrast. Multiplanar CT image reconstructions and MIPs were obtained to evaluate the vascular anatomy. CONTRAST:  ISOVUE-370 IOPAMIDOL (ISOVUE-370) INJECTION 76% COMPARISON:  Chest x-ray 01/11/2017 FINDINGS: Cardiovascular: The heart is normal in size. No pericardial effusion. The aorta is normal in caliber. No dissection. The branch vessels are patent. No significant coronary artery calcifications. The pulmonary arterial tree is well opacified. No filling defects to suggest pulmonary embolism. Mediastinum/Nodes: No mass or adenopathy. The esophagus is unremarkable. Lungs/Pleura: No acute pulmonary findings.  No pulmonary lesions. Upper Abdomen: No significant upper abdominal findings. Musculoskeletal: No breast masses, supraclavicular or axillary adenopathy. The bony structures are unremarkable. Review of the MIP images confirms the above findings. IMPRESSION: 1. No CT findings for pulmonary embolism. 2. Normal  thoracic aorta. 3. No acute pulmonary findings. Electronically Signed   By: Rudie Meyer M.D.   On: 01/11/2017 09:36    Assessment and Plan:   1. A flutter with RVR with chest tightness and weakness.  Began at 0100 this AM neg MI.  Dr. Jens Som to see,  ? Flecainide 300 mg  2. Hx of anticoagulation on coumadin for PE, today  subtherapeutic - has rec'd therapeutic lovenox.  Neg PE on CTA 3. Hx SVT with ablation   4. Hx hypothyroid with replacement and TSH 1.091    For questions or updates, please contact [facility] Please consult [email or web address] for contact info under Cardiology/STEMI.   Signed, Nada BoozerLaura Ingold, NP  01/11/2017 1:30 PM;  As above, patient seen briefly she is a [age] with past medical history of AVNRT ablation, prior pulmonary embolus on chronic Coumadin therapy, hypothyroidism, hypercoagulable state with atrial flutter versus atrial tachycardia.  November 2015 showed normal function.  Patient awoke at 1 AM with new onset palpitations.  This was associated with weakness, fatigue, dyspnea, chest tightness and dizziness.  Her symptoms persisted and she presented to the emergency room.  Electrocardiogram shows atrial tachycardia versus atrial flutter.  Rhythm strips reviewed and also show this.  CT shows no pulmonary embolus.  Troponins normal. 1 atrial flutter versus atrial tachycardia-symptoms clearly began at 1 AM.  I have discussed the patient with Dr. Ladona Ridgelaylor.  We will give 1 dose of flecainide 300 mg to hopefully convert.  If she does not convert in 1-2 hours we will proceed with cardioversion as her symptoms began less than 24 hours ago.  Will add Toprol 25 mg daily.  Her only embolic risk factor is female sex. CHADsvasc 1.  However she is on long-term Coumadin therapy for history of pulmonary emboli and hypercoagulable state.  We will continue. 2 history of pulmonary emboli-continue Coumadin with goal INR 2-3. 3 hypothyroidism-continue preadmission medications. Olga MillersBrian Lizzet Hendley,  MD

## 2017-01-11 NOTE — ED Notes (Signed)
Patient transported to X-ray 

## 2017-01-11 NOTE — Progress Notes (Signed)
ANTICOAGULATION CONSULT NOTE - Initial Consult  Pharmacy Consult for Lovenox Indication: pulmonary embolus  No Known Allergies  Patient Measurements:   Last documented weight was 69kg on 07/28/16  Vital Signs: Temp: 97.5 F (36.4 C) (01/09 0707) Temp Source: Oral (01/09 0707) BP: 130/91 (01/09 0950) Pulse Rate: 124 (01/09 0950)  Labs: Recent Labs    01/11/17 0726  HGB 15.6*  HCT 46.0  PLT 233  LABPROT 16.3*  INR 1.33  CREATININE 1.03*    CrCl cannot be calculated (Unknown ideal weight.).   Medical History: Past Medical History:  Diagnosis Date  . Arthritis   . H/O blood clots   . History of chicken pox   . Migraines   . Mitral valve prolapse   . Thyroid disease     Medications:  Scheduled:  . enoxaparin (LOVENOX) injection  70 mg Subcutaneous STAT  . iopamidol      . midazolam  2 mg Intravenous Once   Infusions:  . sodium chloride      Assessment: 56 yoF [facility] with chest tightness, palpitations, near syncope, and dizziness.  PMH is significant for protein S deficiency, PE, on chronic warfarin anticoagulation.  Pharmacy is consulted to dose Lovenox for suspected PE.  PTA Warfarin 6 mg daily.  Last dose on 1/8 at 1800 Admission INR is subtherapeutic at 1.33  Today, 01/11/2017:  D-dimer 0.46  INR 1.33  CBC: Hgb 15.6, Plt 233  SCr 1, CrCl > 30 ml/min  Goal of Therapy:  INR 2-3 Anti-Xa level 0.6-1 units/ml 4hrs after LMWH dose given Monitor platelets by anticoagulation protocol: Yes   Plan:   Lovenox 70mg  SQ q12h  Monitor renal function, CBC  Follow up weight for this admission.  Follow up CT and long term anticoagulation plans.  Lynann Beaverhristine Hanifah Royse PharmD, BCPS Pager [phone] 01/11/2017 10:20 AM

## 2017-01-11 NOTE — ED Notes (Signed)
ED Provider at bedside. 

## 2017-01-11 NOTE — ED Notes (Signed)
Bed: WA25 Expected date:  Expected time:  Means of arrival:  Comments: TR 1 

## 2017-01-11 NOTE — ED Provider Notes (Addendum)
[facility]-EMERGENCY DEPT Provider Note   CSN: [id number] Arrival date & time: 01/11/17  0703     History   Chief Complaint Chief Complaint  Patient presents with  . Tachycardia    HPI Melinda Benton is a 57 y.o. female.  HPI  57 year old female comes in with chief complaint of chest discomfort and palpitations. Patient has history of protein S deficiency, she is on warfarin.  Patient also has a history of PE.  Patient reports that at 1 AM she woke up because of palpitations and chest tightness.  Her symptoms have been consistent and persistent since then.  This morning patient was getting ready and had near syncope, along with few episodes of dizziness. Patient denies any shortness of breath.  Patient has history of thyroid disorder, she is on thyroid supplements.  Patient's past medical history is also positive for mitral valve prolapse.  Patient denies any history of anxiety, substance abuse, heavy smoking.  Past Medical History:  Diagnosis Date  . Arthritis   . H/O blood clots   . History of chicken pox   . Migraines   . Mitral valve prolapse   . Thyroid disease     Patient Active Problem List   Diagnosis Date Noted  . Chronic pain of right knee 06/21/2016  . Spider varicose veins 09/01/2015  . Cough 05/19/2014  . Palpitations 10/29/2013  . Arthritis 10/09/2013  . Pre-op exam 10/09/2013  . Pulmonary embolus (HCC) 02/15/2010  . Hypercoagulable state (HCC) 02/15/2010    Past Surgical History:  Procedure Laterality Date  . ELBOW SURGERY  2009  . ROTATOR CUFF REPAIR  2011  . TONSILLECTOMY  ?1968    OB History    No data available       Home Medications    Prior to Admission medications   Medication Sig Start Date End Date Taking? Authorizing Provider  buPROPion (WELLBUTRIN XL) 300 MG 24 hr tablet Take 300 mg by mouth daily.   Yes [provider]  levothyroxine (SYNTHROID, LEVOTHROID) 75 MCG tablet Take 75 mcg by mouth  daily before breakfast.  08/04/15  Yes [provider]  warfarin (COUMADIN) 6 MG tablet Take 1 tablet daily or as directed by anticoagulation clinic.  90 day 09/08/16  Yes Veryl Speakalone, Gregory D, FNP  dicyclomine (BENTYL) 20 MG tablet Take 1 tablet (20 mg total) by mouth 4 (four) times daily -  before meals and at bedtime. Patient not taking: Reported on 01/11/2017 07/29/16   Dione BoozeGlick, David, MD  ondansetron (ZOFRAN) 4 MG tablet Take 1 tablet (4 mg total) by mouth every 6 (six) hours as needed for nausea. Patient not taking: Reported on 01/11/2017 07/29/16   Dione BoozeGlick, David, MD  pantoprazole (PROTONIX) 40 MG tablet Take 1 tablet (40 mg total) by mouth daily. Patient not taking: Reported on 01/11/2017 07/29/16   Dione BoozeGlick, David, MD    Family History Family History  Problem Relation Age of Onset  . Protein S deficiency Mother   . Heart disease Father   . Diabetes Father   . Colon cancer Paternal Grandfather   . Stroke Brother   . Diabetes Paternal Aunt   . Diabetes Paternal Uncle   . Colon cancer Paternal Grandmother     Social History Social History   Tobacco Use  . Smoking status: Never Smoker  . Smokeless tobacco: Never Used  Substance Use Topics  . Alcohol use: Yes    Comment: Socially  . Drug use: No  Allergies   Patient has no known allergies.   Review of Systems Review of Systems  All other systems reviewed and are negative.    Physical Exam Updated Vital Signs BP 113/79   Pulse (!) 101   Temp (!) 97.5 F (36.4 C) (Oral)   Resp 14   SpO2 98%   Physical Exam  Constitutional: She is oriented to person, place, and time. She appears well-developed.  HENT:  Head: Normocephalic and atraumatic.  Eyes: EOM are normal.  Neck: Normal range of motion. Neck supple. No JVD present.  Cardiovascular:  tachycardia  Pulmonary/Chest: Effort normal. No respiratory distress.  Abdominal: Bowel sounds are normal.  Neurological: She is alert and oriented to person, place, and time.    Skin: Skin is warm and dry.  Nursing note and vitals reviewed.    ED Treatments / Results  Labs (all labs ordered are listed, but only abnormal results are displayed) Labs Reviewed  BASIC METABOLIC PANEL - Abnormal; Notable for the following components:      Result Value   Glucose, Bld 120 (*)    Creatinine, Ser 1.03 (*)    GFR calc non Af Amer 60 (*)    All other components within normal limits  CBC - Abnormal; Notable for the following components:   RBC 5.17 (*)    Hemoglobin 15.6 (*)    All other components within normal limits  PROTIME-INR - Abnormal; Notable for the following components:   Prothrombin Time 16.3 (*)    All other components within normal limits  D-DIMER, QUANTITATIVE (NOT AT [facility])  TSH  MAGNESIUM  I-STAT TROPONIN, ED  I-STAT BETA HCG BLOOD, ED ([facility], [facility], [facility] ONLY)  I-STAT TROPONIN, ED    EKG  EKG Interpretation  Date/Time:  Wednesday January 11 2017 07:12:17 EST Ventricular Rate:  124 PR Interval:    QRS Duration: 78 QT Interval:  329 QTC Calculation: 473 R Axis:   92 Text Interpretation:  Sinus tachycardia Borderline right axis deviation Anteroseptal infarct, old Minimal ST depression, lateral leads No acute changes Confirmed by Derwood Kaplan [phone]) on 01/11/2017 7:49:30 AM        EKG Interpretation  Date/Time:  Wednesday January 11 2017 10:13:24 EST Ventricular Rate:  125 PR Interval:    QRS Duration: 79 QT Interval:  334 QTC Calculation: 482 R Axis:   91 Text Interpretation:  Atrial flutter with predominant 2:1 AV block Borderline right axis deviation appears to be PSVT or aflutter - suspect latter Confirmed by Derwood Kaplan [phone]) on 01/11/2017 12:07:39 PM       EKG Interpretation  Date/Time:  Wednesday January 11 2017 16:53:06 EST Ventricular Rate:  85 PR Interval:    QRS Duration: 106 QT Interval:  431 QTC Calculation: 482 R Axis:   114 Text Interpretation:  Atrial flutter Right axis deviation Anteroseptal infarct, old No  acute changes Confirmed by Derwood Kaplan ([id number]) on 01/11/2017 5:18:54 PM         Radiology Dg Chest 2 View  Result Date: 01/11/2017 CLINICAL DATA:  Shortness of breath and chest pain EXAM: CHEST  2 VIEW COMPARISON:  July 28, 2016 FINDINGS: Lungs are clear. Heart size and pulmonary vascularity are normal. No adenopathy. No pneumothorax. No bone lesions. IMPRESSION: No edema or consolidation. Electronically Signed   By: Bretta Bang III M.D.   On: 01/11/2017 08:07   Ct Angio Chest Pe W And/or Wo Contrast  Result Date: 01/11/2017 CLINICAL DATA:  Tachycardia and chest pain since this morning. EXAM:  CT ANGIOGRAPHY CHEST WITH CONTRAST TECHNIQUE: Multidetector CT imaging of the chest was performed using the standard protocol during bolus administration of intravenous contrast. Multiplanar CT image reconstructions and MIPs were obtained to evaluate the vascular anatomy. CONTRAST:  ISOVUE-370 IOPAMIDOL (ISOVUE-370) INJECTION 76% COMPARISON:  Chest x-ray 01/11/2017 FINDINGS: Cardiovascular: The heart is normal in size. No pericardial effusion. The aorta is normal in caliber. No dissection. The branch vessels are patent. No significant coronary artery calcifications. The pulmonary arterial tree is well opacified. No filling defects to suggest pulmonary embolism. Mediastinum/Nodes: No mass or adenopathy. The esophagus is unremarkable. Lungs/Pleura: No acute pulmonary findings.  No pulmonary lesions. Upper Abdomen: No significant upper abdominal findings. Musculoskeletal: No breast masses, supraclavicular or axillary adenopathy. The bony structures are unremarkable. Review of the MIP images confirms the above findings. IMPRESSION: 1. No CT findings for pulmonary embolism. 2. Normal thoracic aorta. 3. No acute pulmonary findings. Electronically Signed   By: Rudie Meyer M.D.   On: 01/11/2017 09:36    Procedures .Cardioversion Date/Time: 01/11/2017 5:40 PM Performed by: Derwood Kaplan, MD Authorized  by: Derwood Kaplan, MD   Consent:    Consent obtained:  Written   Consent given by:  Patient   Risks discussed:  Induced arrhythmia and pain   Alternatives discussed:  No treatment and rate-control medication Pre-procedure details:    Cardioversion basis:  Elective   Rhythm:  Atrial flutter   Electrode placement:  Anterior-posterior Patient sedated: Yes. Refer to sedation procedure documentation for details of sedation.  Attempt one:    Cardioversion mode:  Synchronous   Waveform:  Biphasic   Shock (Joules):  100   Shock outcome:  Conversion to normal sinus rhythm Post-procedure details:    Patient status:  Alert   Patient tolerance of procedure:  Tolerated well, no immediate complications .Sedation Date/Time: 01/12/2017 5:30 PM Performed by: Derwood Kaplan, MD Authorized by: Derwood Kaplan, MD   Consent:    Consent obtained:  Written   Consent given by:  Patient   Risks discussed:  Dysrhythmia, inadequate sedation, nausea, prolonged sedation necessitating reversal, respiratory compromise necessitating ventilatory assistance and intubation and vomiting   Alternatives discussed:  Analgesia without sedation Universal protocol:    Procedure explained and questions answered to patient or proxy's satisfaction: yes     Relevant documents present and verified: yes     Test results available and properly labeled: yes     Imaging studies available: yes     Required blood products, implants, devices, and special equipment available: yes     Immediately prior to procedure a time out was called: yes     Patient identity confirmation method:  Arm band Indications:    Procedure performed:  Cardioversion   Intended level of sedation:  Moderate (conscious sedation) Pre-sedation assessment:    Time since last food or drink:  GREATER THAN 6 HOURS   ASA classification: class 2 - patient with mild systemic disease     Neck mobility: normal     Mouth opening:  3 or more finger widths    Mallampati score:  III - soft palate, base of uvula visible   Pre-sedation assessments completed and reviewed: airway patency, cardiovascular function, hydration status, mental status, nausea/vomiting, pain level and respiratory function     Pre-sedation assessment completed:  01/11/2017 5:00 PM Immediate pre-procedure details:    Reassessment: Patient reassessed immediately prior to procedure     Reviewed: vital signs, relevant labs/tests and NPO status     Verified: bag  valve mask available, emergency equipment available, intubation equipment available, IV patency confirmed, oxygen available, reversal medications available and suction available   Procedure details (see MAR for exact dosages):    Preoxygenation:  Nasal cannula   Sedation:  Etomidate   Intra-procedure monitoring:  Blood pressure monitoring, cardiac monitor, continuous capnometry, continuous pulse oximetry and frequent vital sign checks   Intra-procedure events: none     Total Provider sedation time (minutes):  25 Post-procedure details:    Post-sedation assessment completed:  01/11/2017 5:58 PM   Attendance: Constant attendance by certified staff until patient recovered     Recovery: Patient returned to pre-procedure baseline     Post-sedation assessments completed and reviewed: airway patency, cardiovascular function, mental status, nausea/vomiting, pain level and respiratory function     Patient is stable for discharge or admission: yes     Patient tolerance:  Tolerated well, no immediate complications Comments:     PATIENT WILL BE PICKED UP BY HER FRIEND.   (including critical care time)  Medications Ordered in ED Medications  enoxaparin (LOVENOX) injection 70 mg (not administered)  metoprolol succinate (TOPROL-XL) 24 hr tablet 25 mg (25 mg Oral Given 01/11/17 1604)  etomidate (AMIDATE) injection 8 mg (not administered)  sodium chloride flush (NS) 0.9 % injection 3 mL (not administered)  sodium chloride flush (NS) 0.9 %  injection 3 mL (not administered)  0.9 %  sodium chloride infusion (not administered)  iopamidol (ISOVUE-370) 76 % injection 100 mL (100 mLs Intravenous Contrast Given 01/11/17 0847)  midazolam (VERSED) injection 2 mg (2 mg Intravenous Given 01/11/17 1033)  enoxaparin (LOVENOX) injection 70 mg (70 mg Subcutaneous Given 01/11/17 1130)  sodium chloride 0.9 % bolus 1,000 mL (0 mLs Intravenous Stopped 01/11/17 1130)  flecainide (TAMBOCOR) tablet 300 mg (300 mg Oral Given 01/11/17 1506)     Initial Impression / Assessment and Plan / ED Course  I have reviewed the triage vital signs and the nursing notes.  Pertinent labs & imaging results that were available during my care of the patient were reviewed by me and considered in my medical decision making (see chart for details).  Clinical Course as of Jan 12 1715  Wed Jan 11, 2017  1024 CT PE is negative Repeat EKG concerning for possible atrial flutter/atrial tachycardia. CT Angio Chest PE W and/or Wo Contrast [AN]  1715 Cardiology team saw the patient.  She was noted to be in atrial flutter.  Patient was given flecainide, with no response.  At this time we will proceed with electrical cardioversion. Patient was given metoprolol as well, which has brought the heart rate less than 100.  Risk and benefits of the cardioversion discussed with the patient.  Patient is a candidate for electrical cardioversion given that her symptoms started within the last 48 hours.  [AN]    Clinical Course User Index [AN] Derwood Kaplan, MD    Patient comes in with chief complaint of chest tightness and palpitations. My clinical concerns are that she might be having a PE.  INR level ordered and it is subtherapeutic.  Patient also has history of MVP, which puts her at high risk for A. Fib / atrial arrhythmia, however her current EKG shows sinus tachycardia. TSH also sent out. CT PE ordered given the high pretest probability for PE.   CHA2DS2/VAS Stroke Risk Points  - 1     ? >= 2 Points: High Risk  1 - 1.99 Points: Medium Risk  0 Points: Low Risk    A  final score could not be computed because of missing components.:   Change: N/A     Details    This score determines the patient's risk of having a stroke if the  patient has atrial fibrillation.       Points Metrics  0 Has Congestive Heart Failure:  No   0 Has Vascular Disease:  No   0 Has Hypertension:  No   0 Age:  32   0 Has Diabetes:  No   0 Had Stroke:  No  Had TIA:  No  Had thromboembolism:  No   1 Female:  Yes        LATE ENTRY: Successful electric cardioversion.     Final Clinical Impressions(s) / ED Diagnoses   Final diagnoses:  New onset atrial flutter [facility])    ED Discharge Orders        Ordered    Amb referral to AFIB Clinic     01/11/17 1705       Derwood Kaplan, MD 01/11/17 1717    Derwood Kaplan, MD 01/12/17 [phone]

## 2017-01-11 NOTE — ED Triage Notes (Addendum)
Pt presents w/ HR 130s and 7/10 chest tightness. Pt denies hx of SVT, but reports cardiac hx. Pt A+OX4, speaking in complete sentences.

## 2017-01-11 NOTE — Discharge Instructions (Signed)
We saw you in the emergency room for palpitations, and diagnosed you with new onset of atrial flutter. We were able to successfully cardioverted into normal sinus rhythm.  Please read the instructions on atrial flutter. Please follow-up with cardiologist as requested in the discharge paperwork.  You may call and confirm your appointment tomorrow.

## 2017-01-11 NOTE — ED Notes (Signed)
Pt request blood be pulled off of IV

## 2017-01-11 NOTE — ED Notes (Signed)
Cardiology at bedside speaking with patient

## 2017-01-11 NOTE — Progress Notes (Signed)
End tidal 30.

## 2017-01-11 NOTE — ED Notes (Signed)
Patient transported to CT 

## 2017-01-12 ENCOUNTER — Telehealth (HOSPITAL_COMMUNITY): Payer: Self-pay | Admitting: *Deleted

## 2017-01-12 NOTE — Telephone Encounter (Signed)
LMOM for pt to clbk to sched appt.. Ref from ED

## 2017-01-17 ENCOUNTER — Other Ambulatory Visit (HOSPITAL_COMMUNITY): Payer: Self-pay

## 2017-01-18 ENCOUNTER — Ambulatory Visit (HOSPITAL_COMMUNITY)
Admission: RE | Admit: 2017-01-18 | Discharge: 2017-01-18 | Disposition: A | Discharge status: Home / Self Care | Payer: No Typology Code available for payment source | Source: Ambulatory Visit | Attending: Nurse Practitioner | Admitting: Nurse Practitioner

## 2017-01-18 ENCOUNTER — Encounter (HOSPITAL_COMMUNITY): Payer: Self-pay | Admitting: Nurse Practitioner

## 2017-01-18 ENCOUNTER — Other Ambulatory Visit: Payer: Self-pay

## 2017-01-18 ENCOUNTER — Ambulatory Visit (HOSPITAL_BASED_OUTPATIENT_CLINIC_OR_DEPARTMENT_OTHER): Payer: No Typology Code available for payment source

## 2017-01-18 VITALS — BP 114/78 | HR 70 | Ht 68.5 in | Wt 164.0 lb

## 2017-01-18 DIAGNOSIS — Z7901 Long term (current) use of anticoagulants: Secondary | ICD-10-CM | POA: Insufficient documentation

## 2017-01-18 DIAGNOSIS — R000 Tachycardia, unspecified: Secondary | ICD-10-CM | POA: Diagnosis not present

## 2017-01-18 DIAGNOSIS — E039 Hypothyroidism, unspecified: Secondary | ICD-10-CM | POA: Insufficient documentation

## 2017-01-18 DIAGNOSIS — Z79899 Other long term (current) drug therapy: Secondary | ICD-10-CM | POA: Diagnosis not present

## 2017-01-18 DIAGNOSIS — I4892 Unspecified atrial flutter: Secondary | ICD-10-CM

## 2017-01-18 DIAGNOSIS — I313 Pericardial effusion (noninflammatory): Secondary | ICD-10-CM | POA: Insufficient documentation

## 2017-01-18 DIAGNOSIS — Z79890 Hormone replacement therapy (postmenopausal): Secondary | ICD-10-CM | POA: Insufficient documentation

## 2017-01-18 DIAGNOSIS — I341 Nonrheumatic mitral (valve) prolapse: Secondary | ICD-10-CM | POA: Diagnosis not present

## 2017-01-18 DIAGNOSIS — I471 Supraventricular tachycardia: Secondary | ICD-10-CM | POA: Diagnosis not present

## 2017-01-18 DIAGNOSIS — D6859 Other primary thrombophilia: Secondary | ICD-10-CM | POA: Insufficient documentation

## 2017-01-18 LAB — ECHOCARDIOGRAM COMPLETE
Area-P 1/2: 2.53 cm2
CHL CUP MV DEC (S): 296
CHL CUP RV SYS PRESS: 19 mmHg
CHL CUP TV REG PEAK VELOCITY: 200 cm/s
E decel time: 296 msec
E/e' ratio: 10.74
FS: 35 % (ref 28–44)
IV/PV OW: 0.82
LA ID, A-P, ES: 31 mm
LA diam index: 1.7 cm/m2
LA vol A4C: 41 ml
LA vol index: 23.3 mL/m2
LA vol: 42.6 mL
LEFT ATRIUM END SYS DIAM: 31 mm
LV PW d: 11 mm — AB (ref 0.6–1.1)
LV TDI E'LATERAL: 7.18
LVEEAVG: 10.74
LVEEMED: 10.74
LVELAT: 7.18 cm/s
LVOT VTI: 20.9 cm
LVOT area: 3.46 cm2
LVOT diameter: 21 mm
LVOT peak vel: 101 cm/s
LVOTSV: 72 mL
Lateral S' vel: 12.3 cm/s
MV pk A vel: 84.9 m/s
MV pk E vel: 77.1 m/s
MVPG: 2 mmHg
P 1/2 time: 87 ms
TAPSE: 27.3 mm
TDI e' medial: 7.72
TR max vel: 200 cm/s
TVPG: 200 mmHg

## 2017-01-18 NOTE — Progress Notes (Signed)
Primary Care Physician: Patient, No Pcp Per Referring Physician: [facility] ER f/u   Melinda Benton is a 57 y.o. female with a h/o protein S deficiency, hypothyroidism, on coumadin,  AV nodal reentrant tachycardia ablation  years ago by Dr. Ladona Ridgel. She presented to [facility] ER with tachycardia with EKG showing atrial flutter at 130 bpm. She was successfully cardioverted in the ER.   She reports that she was in [place] over Christmas and was having a terrible time getting her sleep patterns back in syn with the jet lag on return home.That is the only thing that was out of her usual routine. She was given metoprolol to take daily but she has not started it and does not care to take it daily. Ekg's look  like S. tach vrs atrial flutter. INR was low, pt had forgotten to take coumadin a couple of days due to being off schedule. She had echo done today.  Today, she denies symptoms of palpitations, chest pain, shortness of breath, orthopnea, PND, lower extremity edema, dizziness, presyncope, syncope, or neurologic sequela. The patient is tolerating medications without difficulties and is otherwise without complaint today.   Past Medical History:  Diagnosis Date  . Arthritis   . H/O blood clots   . History of chicken pox   . Migraines   . Mitral valve prolapse   . Thyroid disease    Past Surgical History:  Procedure Laterality Date  . ELBOW SURGERY  2009  . ROTATOR CUFF REPAIR  2011  . TONSILLECTOMY  ?1968    Current Outpatient Medications  Medication Sig Dispense Refill  . buPROPion (WELLBUTRIN XL) 300 MG 24 hr tablet Take 300 mg by mouth daily.    Marland Kitchen levothyroxine (SYNTHROID, LEVOTHROID) 88 MCG tablet Take 88 mcg by mouth daily before breakfast.    . warfarin (COUMADIN) 6 MG tablet Take 1 tablet daily or as directed by anticoagulation clinic.  90 day (Patient taking differently: Take 6 mg by mouth daily at 6 PM. ) 90 tablet 0  . metoprolol succinate (TOPROL-XL) 25 MG 24 hr tablet Take 1 tablet  (25 mg total) by mouth daily. (Patient not taking: Reported on 01/18/2017) 30 tablet 0   No current facility-administered medications for this encounter.     No Known Allergies  Social History   Socioeconomic History  . Marital status: Married    Spouse name: Not on file  . Number of children: 3  . Years of education: 22  . Highest education level: Not on file  Social Needs  . Financial resource strain: Not on file  . Food insecurity - worry: Not on file  . Food insecurity - inability: Not on file  . Transportation needs - medical: Not on file  . Transportation needs - non-medical: Not on file  Occupational History  . Occupation: Self Employed    Employer: UNEMPLOYED  Tobacco Use  . Smoking status: Never Smoker  . Smokeless tobacco: Never Used  Substance and Sexual Activity  . Alcohol use: Yes    Comment: Socially  . Drug use: No  . Sexual activity: Yes  Other Topics Concern  . Not on file  Social History Narrative   Regular exercise-yes   Caffeine Use-yes    Family History  Problem Relation Age of Onset  . Protein S deficiency Mother   . Heart disease Father   . Diabetes Father   . Colon cancer Paternal Grandfather   . Stroke Brother   . Diabetes Paternal Aunt   .  Diabetes Paternal Uncle   . Colon cancer Paternal Grandmother     ROS- All systems are reviewed and negative except as per the HPI above  Physical Exam: Vitals:   01/18/17 1440  BP: 114/78  Pulse: 70  Weight: 164 lb (74.4 kg)  Height: 5' 8.5" (1.74 m)   Wt Readings from Last 3 Encounters:  01/18/17 164 lb (74.4 kg)  07/28/16 152 lb (68.9 kg)  06/21/16 154 lb 6.4 oz (70 kg)    Labs: Lab Results  Component Value Date   NA 141 01/11/2017   K 4.1 01/11/2017   CL 109 01/11/2017   CO2 24 01/11/2017   GLUCOSE 120 (H) 01/11/2017   BUN 12 01/11/2017   CREATININE 1.03 (H) 01/11/2017   CALCIUM 9.5 01/11/2017   MG 1.9 01/11/2017   Lab Results  Component Value Date   INR 1.33 01/11/2017     No results found for: CHOL, HDL, LDLCALC, TRIG   GEN- The patient is well appearing, alert and oriented x 3 today.   Head- normocephalic, atraumatic Eyes-  Sclera clear, conjunctiva pink Ears- hearing intact Oropharynx- clear Neck- supple, no JVP Lymph- no cervical lymphadenopathy Lungs- Clear to ausculation bilaterally, normal work of breathing Heart- Regular rate and rhythm, no murmurs, rubs or gallops, PMI not laterally displaced GI- soft, NT, ND, + BS Extremities- no clubbing, cyanosis, or edema MS- no significant deformity or atrophy Skin- no rash or lesion Psych- euthymic mood, full affect Neuro- strength and sensation are intact  EKG-NSR at 76 bpm, Pr int 178 ms, qrs 84 ms, qtc 429 ms Epic records reviewed ECHO- DONE TODAY-Study Conclusions  - Left ventricle: The cavity size was normal. Wall thickness was   normal. Systolic function was normal. The estimated ejection   fraction was in the range of 60% to 65%. Wall motion was normal;   there were no regional wall motion abnormalities. Doppler   parameters are consistent with abnormal left ventricular   relaxation (grade 1 diastolic dysfunction). The E/e&' ratio is   between 8-15, suggesting indeterminate LV filling pressure. - Left atrium: The atrium was normal in size. - Right atrium: The atrium was normal in size. - Tricuspid valve: There was trivial regurgitation. - Pulmonary arteries: PA peak pressure: 19 mm Hg (S). - Inferior vena cava: The vessel was normal in size. The   respirophasic diameter changes were in the normal range (= 50%),   consistent with normal central venous pressure. - Pericardium, extracardiac: A trivial pericardial effusion was   identified posterior to the heart. Features were not consistent   with tamponade physiology.  Impressions:  - Compared to a prior study in 2015, there are few changes. LVEF is   60-65% with a persistent trivial posterior pericardial  effusion.    Assessment and Plan: 1.Sinus/atrial tach vrs atrial flutter Possibly precipitated by jet lag and poor sleep Successful  cardioversion Previous  AVNRT ablation several years ago by Dr. Ladona Ridgelaylor Will refer her back to see if any further EP study/intervention is necessary She was given RX for metoprolol succinate 25 mg daily which she did not start, wants to hold off until she speaks to Dr. Ladona Ridgelaylor She was subtherapeutic in the ER( given lovenox in ER) and has not had any INR checked with her coumadin clinc since she returned home from trip, encouraged her to have checked soon Chadsvasc score is 1 for female, on coumadin for h/o PE and hypercoagulable state  Appointment given with Dr. Ladona Ridgelaylor for 1/30 at  3:30 pm  Elvina Sidle. [facility] [address] [place], [place] [address] [phone]

## 2017-01-24 ENCOUNTER — Ambulatory Visit: Payer: BLUE CROSS/BLUE SHIELD | Admitting: Nurse Practitioner

## 2017-02-01 ENCOUNTER — Encounter: Payer: Self-pay | Admitting: Internal Medicine

## 2017-02-01 ENCOUNTER — Ambulatory Visit (INDEPENDENT_AMBULATORY_CARE_PROVIDER_SITE_OTHER): Payer: 59 | Admitting: Internal Medicine

## 2017-02-01 VITALS — BP 124/74 | HR 66 | Ht 68.0 in | Wt 162.0 lb

## 2017-02-01 DIAGNOSIS — I471 Supraventricular tachycardia: Secondary | ICD-10-CM

## 2017-02-01 DIAGNOSIS — I4892 Unspecified atrial flutter: Secondary | ICD-10-CM | POA: Diagnosis not present

## 2017-02-01 MED ORDER — METOPROLOL SUCCINATE ER 25 MG PO TB24: 25.0000 mg | ORAL_TABLET | ORAL | 0 refills | Status: DC | PRN

## 2017-02-01 NOTE — Progress Notes (Signed)
HPI [name] is referred today for evaluation of SVT. 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 4 years ago she experienced palpitations and I saw her and discussed AA drug therapy but she preferred to undergo watchful waiting and lifestyle modification. She did better but developed recurrent palpitations and ultimately went to the ED where she was in atypical atrial flutter and was given flecainide and then DCCV back to NSR. She has had no recurrent symptoms in the interim. She has been hesitant to go back to exercising. She denies dietary indiscretion. No other complaints. Her ECG on presentation demonstrated what appears to be atrial flutter with 2:1 AV conduction.   No Known Allergies   Current Outpatient Medications  Medication Sig Dispense Refill  . buPROPion (WELLBUTRIN XL) 300 MG 24 hr tablet Take 300 mg by mouth daily.    Marland Kitchen. levothyroxine (SYNTHROID, LEVOTHROID) 88 MCG tablet Take 88 mcg by mouth daily before breakfast.    . metoprolol succinate (TOPROL-XL) 25 MG 24 hr tablet Take 1 tablet (25 mg total) by mouth as needed. 30 tablet 0  . warfarin (COUMADIN) 6 MG tablet Take 1 tablet daily or as directed by anticoagulation clinic.  90 day (Patient taking differently: Take 6 mg by mouth daily at 6 PM. ) 90 tablet 0   No current facility-administered medications for this visit.      Past Medical History:  Diagnosis Date  . Arthritis   . H/O blood clots   . History of chicken pox   . Migraines   . Mitral valve prolapse   . Thyroid disease     ROS:   All systems reviewed and negative except as noted in the HPI.   Past Surgical History:  Procedure Laterality Date  . ELBOW SURGERY  2009  . ROTATOR CUFF REPAIR  2011  . TONSILLECTOMY  ?1968     Family History  Problem Relation Age of Onset  . Protein S deficiency Mother   . Heart disease Father   . Diabetes Father   . Colon  cancer Paternal Grandfather   . Stroke Brother   . Diabetes Paternal Aunt   . Diabetes Paternal Uncle   . Colon cancer Paternal Grandmother      Social History   Socioeconomic History  . Marital status: Married    Spouse name: Not on file  . Number of children: 3  . Years of education: 8416  . Highest education level: Not on file  Social Needs  . Financial resource strain: Not on file  . Food insecurity - worry: Not on file  . Food insecurity - inability: Not on file  . Transportation needs - medical: Not on file  . Transportation needs - non-medical: Not on file  Occupational History  . Occupation: Self Employed    Employer: UNEMPLOYED  Tobacco Use  . Smoking status: Never Smoker  . Smokeless tobacco: Never Used  Substance and Sexual Activity  . Alcohol use: Yes    Comment: Socially  . Drug use: No  . Sexual activity: Yes  Other Topics Concern  . Not on file  Social History Narrative   Regular exercise-yes   Caffeine Use-yes     BP 124/74   Pulse 66   Ht 5\' 8"  (1.727 m)   Wt 162 lb (73.5 kg)   BMI 24.63 kg/m   Physical Exam:  Well appearing middle aged woman, NAD HEENT: Unremarkable Neck:  No JVD, no thyromegally Lymphatics:  No adenopathy Back:  No CVA tenderness Lungs:  Clear HEART:  Regular rate rhythm, no murmurs, no rubs, no clicks Abd:  soft, positive bowel sounds, no organomegally, no rebound, no guarding Ext:  2 plus pulses, no edema, no cyanosis, no clubbing Skin:  No rashes no nodules Neuro:  CN II through XII intact, motor grossly intact  EKG - nsr   Assess/Plan: 1. Atrial flutter/tachycardia - The patient is doing well and has had no recurrent arrhythmias. We discussed taking a beta blocker daily vs as needed. I have recommended she undergo watchful waiting. If she feels palpitations develop, she is instructed to take her toprol.  2. SVT - she has a remote h/o SVT but her symptoms are currently much different.  3. Pulmonary embolism - she  has a h/o chronic PE's and has been on coumadin. She has asked about switching to an OAC.  Leonia Reeves.D.

## 2017-02-01 NOTE — Patient Instructions (Addendum)
Medication Instructions:  Your physician has recommended you make the following change in your medication:  Start taking your Toprol XL.   Use PRN.   Labwork: None ordered.  Testing/Procedures: None ordered.  Follow-Up: Your physician wants you to follow-up in: 3 or 4 months with Dr. Ladona Ridgelaylor.  Any Other Special Instructions Will Be Listed Below (If Applicable).  If you need a refill on your cardiac medications before your next appointment, please call your pharmacy.

## 2017-03-14 ENCOUNTER — Telehealth: Payer: Self-pay | Admitting: Nurse Practitioner

## 2017-03-14 ENCOUNTER — Ambulatory Visit (INDEPENDENT_AMBULATORY_CARE_PROVIDER_SITE_OTHER): Payer: No Typology Code available for payment source | Admitting: General Practice

## 2017-03-14 DIAGNOSIS — D6859 Other primary thrombophilia: Secondary | ICD-10-CM | POA: Diagnosis not present

## 2017-03-14 LAB — POCT INR: INR: 3.7

## 2017-03-14 NOTE — Telephone Encounter (Signed)
Patient was in the office to see Arline AspCindy for coumadin and asked to transfer to [place] [facility] due to [place].  Dr. Okey Duprerawford, is this okay with you? Claris Gower[place]otte, is this okay with you?  Appointment has been scheduled with Claris Gowerharlotte while patient was in the office but can be changed if needed based on response.

## 2017-03-14 NOTE — Telephone Encounter (Signed)
Fine with me

## 2017-03-14 NOTE — Patient Instructions (Addendum)
Pre visit review using our clinic review tool, if applicable. No additional management support is needed unless otherwise documented below in the visit note.  Skip dose today (3/12) and then continue to take 1 tablet daily.  Re-check in 4 weeks.

## 2017-03-15 NOTE — Telephone Encounter (Signed)
Ok to transfer to me then

## 2017-03-15 NOTE — Telephone Encounter (Signed)
I will like to have her as a patient, but I have limited experience in dosing coumadin. So will she be interested in seeing Dr. Doreene BurkeKremer instead in our office?

## 2017-03-15 NOTE — Telephone Encounter (Signed)
Per Arline Aspindy, she will [facility] be [facility].

## 2017-03-16 NOTE — Telephone Encounter (Signed)
Patient already scheduled for 3/18.

## 2017-03-16 NOTE — Telephone Encounter (Signed)
Please contact patient for scheduling

## 2017-03-20 ENCOUNTER — Ambulatory Visit (INDEPENDENT_AMBULATORY_CARE_PROVIDER_SITE_OTHER): Payer: No Typology Code available for payment source | Admitting: Nurse Practitioner

## 2017-03-20 ENCOUNTER — Encounter: Payer: Self-pay | Admitting: Nurse Practitioner

## 2017-03-20 DIAGNOSIS — F411 Generalized anxiety disorder: Secondary | ICD-10-CM

## 2017-03-20 MED ORDER — BUPROPION HCL ER (XL) 300 MG PO TB24: 300.0000 mg | ORAL_TABLET | Freq: Every day | ORAL | 1 refills | Status: DC

## 2017-03-20 NOTE — Progress Notes (Signed)
Subjective:  Patient ID: Melinda Benton, female    DOB: 1960/10/22  Age: 57 y.o. MRN: [id number]  CC: Establish Care (est care--wellbutrin refills consult and migrain med consult? had cpe 05/2016 with GYN?)   HPI  Melinda Benton is here to transfer care from Dr. Okey Duprerawford. She needs Wellbutrin refilled.  Anxiety: onset 1455yrs ago Current use of wellbutrin. Reports stable mood Previously prescribed by Dr. Arelia Benton (GYN with  [facility]). Last seen 05/2016  Migraine: Onset at adolescent. FHx of migraine (father and daughter) Current use of excedrin 3tabs (3x/months) Denies any sign of GI bleed.  [facility]  Outpatient Medications Prior to Visit  Medication Sig Dispense Refill  . levothyroxine (SYNTHROID, LEVOTHROID) 88 MCG tablet Take 88 mcg by mouth daily before breakfast.    . metoprolol succinate (TOPROL-XL) 25 MG 24 hr tablet Take 1 tablet (25 mg total) by mouth as needed. 30 tablet 0  . warfarin (COUMADIN) 6 MG tablet Take 1 tablet daily or as directed by anticoagulation clinic.  90 day (Patient taking differently: Take 6 mg by mouth daily at 6 PM. ) 90 tablet 0  . buPROPion (WELLBUTRIN XL) 300 MG 24 hr tablet Take 300 mg by mouth daily.     No facility-administered medications prior to visit.     ROS Review of Systems  Constitutional: Negative.   Respiratory: Negative for cough and sputum production.   Cardiovascular: Negative for chest pain, palpitations and leg swelling.  Gastrointestinal: Negative for abdominal pain, blood in stool, constipation, melena and nausea.  Musculoskeletal: Negative for falls.  Skin: Negative.   Neurological: Positive for headaches. Negative for tingling and sensory change.  Endo/Heme/Allergies: Does not bruise/bleed easily.  Psychiatric/Behavioral: Negative for depression and suicidal ideas. The patient is not nervous/anxious and does not have insomnia.    Social History    Socioeconomic History  . Marital status: Married    Spouse name: Not on file  . Number of children: 3  . Years of education: 2316  . Highest education level: Not on file  Social Needs  . Financial resource strain: Not on file  . Food insecurity - worry: Not on file  . Food insecurity - inability: Not on file  . Transportation needs - medical: Not on file  . Transportation needs - non-medical: Not on file  Occupational History  . Occupation: Technical brewerinsurance investigator  Tobacco Use  . Smoking status: Never Smoker  . Smokeless tobacco: Never Used  Substance and Sexual Activity  . Alcohol use: Yes    Comment: Socially  . Drug use: No  . Sexual activity: Yes  Other Topics Concern  . Not on file  Social History Narrative   Regular exercise-yes   Caffeine Use-yes   Family History  Problem Relation Age of Onset  . Protein S deficiency Mother   . Clotting disorder Mother   . Heart disease Father   . Diabetes Father   . Colon cancer Paternal Grandfather   . Stroke Brother   . Diabetes Paternal Aunt   . Diabetes Paternal Uncle   . Colon cancer Paternal Grandmother     Objective:  BP 110/70   Pulse 62   Temp 98.2 F (36.8 C) (Oral)   Ht 5\' 8"  (1.727 m)   Wt 163 lb (73.9 kg)   SpO2 98%   BMI 24.78 kg/m   BP Readings from Last 3 Encounters:  03/20/17 110/70  02/01/17 124/74  01/18/17 114/78    Wt  Readings from Last 3 Encounters:  03/20/17 163 lb (73.9 kg)  02/01/17 162 lb (73.5 kg)  01/18/17 164 lb (74.4 kg)    Physical Exam  Constitutional: She is oriented to person, place, and time. No distress.  HENT:  Mouth/Throat: No oropharyngeal exudate.  Neck: Normal range of motion. Neck supple. Thyromegaly present.  Cardiovascular: Normal rate, regular rhythm, normal heart sounds and intact distal pulses.  Pulmonary/Chest: Effort normal and breath sounds normal.  Musculoskeletal: She exhibits no edema.  Lymphadenopathy:    She has no cervical adenopathy.   Neurological: She is alert and oriented to person, place, and time.  Skin: Skin is warm and dry.  Psychiatric: She has a normal mood and affect. Her behavior is normal.  Vitals reviewed.   Lab Results  Component Value Date   WBC 5.1 01/11/2017   HGB 15.6 (H) 01/11/2017   HCT 46.0 01/11/2017   PLT 233 01/11/2017   GLUCOSE 120 (H) 01/11/2017   ALT 31 07/28/2016   AST 43 (H) 07/28/2016   NA 141 01/11/2017   K 4.1 01/11/2017   CL 109 01/11/2017   CREATININE 1.03 (H) 01/11/2017   BUN 12 01/11/2017   CO2 24 01/11/2017   TSH 1.091 01/11/2017   INR 3.7 03/14/2017   HGBA1C 5.5 06/21/2016    Assessment & Plan:   Melinda Benton was seen today for establish care.  Diagnoses and all orders for this visit:  GAD (generalized anxiety disorder) -     buPROPion (WELLBUTRIN XL) 300 MG 24 hr tablet; Take 1 tablet (300 mg total) by mouth daily.   I have changed Melinda Benton. Melinda Benton's buPROPion. I am also having her maintain her warfarin, levothyroxine, and metoprolol succinate.  Meds ordered this encounter  Medications  . buPROPion (WELLBUTRIN XL) 300 MG 24 hr tablet    Sig: Take 1 tablet (300 mg total) by mouth daily.    Dispense:  90 tablet    Refill:  1    Order Specific Question:   Supervising Provider    Answer:   Melinda Benton [[id number]]    Follow-up: Return if symptoms worsen or fail to improve.  Alysia Penna, NP

## 2017-03-20 NOTE — Patient Instructions (Addendum)
Sign medical release to get records from Dr. Talmage NapBalan and Dr. Arelia SneddonMcComb.  Will determine f/up appt based on records from Dr. Arelia SneddonMcComb.

## 2017-03-31 ENCOUNTER — Ambulatory Visit: Payer: No Typology Code available for payment source

## 2017-03-31 ENCOUNTER — Ambulatory Visit (INDEPENDENT_AMBULATORY_CARE_PROVIDER_SITE_OTHER): Payer: No Typology Code available for payment source | Admitting: General Practice

## 2017-03-31 DIAGNOSIS — D6859 Other primary thrombophilia: Secondary | ICD-10-CM

## 2017-03-31 LAB — POCT INR: INR: 3.4

## 2017-03-31 NOTE — Patient Instructions (Addendum)
Pre visit review using our clinic review tool, if applicable. No additional management support is needed unless otherwise documented below in the visit note.  Skip dose today (3/29) and then change dosage and take 6 mg daily except 3 mg on Fridays.  Re-check in 4 weeks.

## 2017-04-04 ENCOUNTER — Ambulatory Visit (INDEPENDENT_AMBULATORY_CARE_PROVIDER_SITE_OTHER): Payer: No Typology Code available for payment source | Admitting: Nurse Practitioner

## 2017-04-04 ENCOUNTER — Encounter: Payer: Self-pay | Admitting: Nurse Practitioner

## 2017-04-04 VITALS — BP 132/76 | HR 66 | Temp 97.6°F | Ht 68.0 in | Wt 163.0 lb

## 2017-04-04 DIAGNOSIS — J0140 Acute pansinusitis, unspecified: Secondary | ICD-10-CM

## 2017-04-04 DIAGNOSIS — J040 Acute laryngitis: Secondary | ICD-10-CM

## 2017-04-04 LAB — T3 UPTAKE: T3 Uptake: 28

## 2017-04-04 LAB — FREE THYROXINE INDEX: FREE THYROXINE INDEX: 1.9

## 2017-04-04 LAB — T4, FREE: Free T4: 0.96

## 2017-04-04 MED ORDER — BENZONATATE 100 MG PO CAPS: 100.0000 mg | ORAL_CAPSULE | Freq: Three times a day (TID) | ORAL | 0 refills | Status: DC | PRN

## 2017-04-04 MED ORDER — GUAIFENESIN ER 600 MG PO TB12: 600.0000 mg | ORAL_TABLET | Freq: Two times a day (BID) | ORAL | 0 refills | Status: DC | PRN

## 2017-04-04 MED ORDER — FLUTICASONE PROPIONATE 50 MCG/ACT NA SUSP: 2.0000 | Freq: Every day | NASAL | 0 refills | Status: DC

## 2017-04-04 MED ORDER — AMOXICILLIN-POT CLAVULANATE 875-125 MG PO TABS: 1.0000 | ORAL_TABLET | Freq: Two times a day (BID) | ORAL | 0 refills | Status: DC

## 2017-04-04 NOTE — Patient Instructions (Signed)
Please inform coumadin clinic about current use of augmentin.  Push oral hydration with water mostly.

## 2017-04-04 NOTE — Progress Notes (Signed)
Subjective:  Patient ID: Melinda Benton, female    DOB: August 29, 1960  Age: 57 y.o. MRN: [id number]  CC: Sore Throat (9 days ago she had laryngitis, sore throat comes and goes. has fatigue/ weakness.non productive cough. Tried OTC to no relief.)  Sinusitis  This is a new problem. The current episode started 1 to 4 weeks ago. The problem has been waxing and waning since onset. There has been no fever. Associated symptoms include chills, congestion, coughing, headaches, a hoarse voice, shortness of breath, sinus pressure and a sore throat. Pertinent negatives include no diaphoresis, ear pain, neck pain, sneezing or swollen glands. Past treatments include acetaminophen and oral decongestants. The treatment provided mild relief.    Outpatient Medications Prior to Visit  Medication Sig Dispense Refill  . buPROPion (WELLBUTRIN XL) 300 MG 24 hr tablet Take 1 tablet (300 mg total) by mouth daily. 90 tablet 1  . levothyroxine (SYNTHROID, LEVOTHROID) 88 MCG tablet Take 88 mcg by mouth daily before breakfast.    . metoprolol succinate (TOPROL-XL) 25 MG 24 hr tablet Take 1 tablet (25 mg total) by mouth as needed. 30 tablet 0  . warfarin (COUMADIN) 6 MG tablet Take 1 tablet daily or as directed by anticoagulation clinic.  90 day (Patient taking differently: Take 6 mg by mouth daily at 6 PM. ) 90 tablet 0   No facility-administered medications prior to visit.     ROS See HPI  Objective:  BP 132/76 (BP Location: Left Arm, Patient Position: Sitting, Cuff Size: Normal)   Pulse 66   Temp 97.6 F (36.4 C) (Oral)   Ht  (1.727 m)   Wt 163 lb (73.9 kg)   SpO2 98%   BMI 24.78 kg/m   BP Readings from Last 3 Encounters:  04/04/17 132/76  03/20/17 110/70  02/01/17 124/74    Wt Readings from Last 3 Encounters:  04/04/17 163 lb (73.9 kg)  03/20/17 163 lb (73.9 kg)  02/01/17 162 lb (73.5 kg)    Physical Exam  Constitutional: She is oriented to person, place, and time.  HENT:  Right Ear:  Tympanic membrane, external ear and ear canal normal.  Left Ear: Tympanic membrane, external ear and ear canal normal.  Nose: Mucosal edema and rhinorrhea present. Right sinus exhibits maxillary sinus tenderness and frontal sinus tenderness. Left sinus exhibits maxillary sinus tenderness and frontal sinus tenderness.  Mouth/Throat: Uvula is midline. No trismus in the jaw. Posterior oropharyngeal erythema present. No oropharyngeal exudate.  Eyes: No scleral icterus.  Neck: Normal range of motion. Neck supple.  Cardiovascular: Normal rate and normal heart sounds.  Pulmonary/Chest: Effort normal and breath sounds normal.  Musculoskeletal: She exhibits no edema.  Lymphadenopathy:    She has no cervical adenopathy.  Neurological: She is alert and oriented to person, place, and time.  Vitals reviewed.   Lab Results  Component Value Date   WBC 5.1 01/11/2017   HGB 15.6 (H) 01/11/2017   HCT 46.0 01/11/2017   PLT 233 01/11/2017   GLUCOSE 120 (H) 01/11/2017   ALT 31 07/28/2016   AST 43 (H) 07/28/2016   NA 141 01/11/2017   K 4.1 01/11/2017   CL 109 01/11/2017   CREATININE 1.03 (H) 01/11/2017   BUN 12 01/11/2017   CO2 24 01/11/2017   TSH 1.091 01/11/2017   INR 3.4 03/31/2017   HGBA1C 5.5 06/21/2016     Assessment & Plan:   Melinda Benton was seen today for sore throat.  Diagnoses and all orders for this  visit:  Acute non-recurrent pansinusitis -     guaiFENesin (MUCINEX) 600 MG 12 hr tablet; Take 1 tablet (600 mg total) by mouth 2 (two) times daily as needed for cough or to loosen phlegm. -     benzonatate (TESSALON) 100 MG capsule; Take 1 capsule (100 mg total) by mouth 3 (three) times daily as needed for cough. -     fluticasone (FLONASE) 50 MCG/ACT nasal spray; Place 2 sprays into both nostrils daily. -     amoxicillin-clavulanate (AUGMENTIN) 875-125 MG tablet; Take 1 tablet by mouth 2 (two) times daily.  Acute laryngitis -     guaiFENesin (MUCINEX) 600 MG 12 hr tablet; Take 1  tablet (600 mg total) by mouth 2 (two) times daily as needed for cough or to loosen phlegm. -     benzonatate (TESSALON) 100 MG capsule; Take 1 capsule (100 mg total) by mouth 3 (three) times daily as needed for cough. -     fluticasone (FLONASE) 50 MCG/ACT nasal spray; Place 2 sprays into both nostrils daily. -     amoxicillin-clavulanate (AUGMENTIN) 875-125 MG tablet; Take 1 tablet by mouth 2 (two) times daily.   I am having Melinda Benton start on guaiFENesin, benzonatate, fluticasone, and amoxicillin-clavulanate. I am also having her maintain her warfarin, levothyroxine, metoprolol succinate, and buPROPion.  Meds ordered this encounter  Medications  . guaiFENesin (MUCINEX) 600 MG 12 hr tablet    Sig: Take 1 tablet (600 mg total) by mouth 2 (two) times daily as needed for cough or to loosen phlegm.    Dispense:  14 tablet    Refill:  0    Order Specific Question:   Supervising Provider    Answer:   Dianne Dun [[id number]]  . benzonatate (TESSALON) 100 MG capsule    Sig: Take 1 capsule (100 mg total) by mouth 3 (three) times daily as needed for cough.    Dispense:  20 capsule    Refill:  0    Order Specific Question:   Supervising Provider    Answer:   Dianne Dun [[id number]]  . fluticasone (FLONASE) 50 MCG/ACT nasal spray    Sig: Place 2 sprays into both nostrils daily.    Dispense:  16 g    Refill:  0    Order Specific Question:   Supervising Provider    Answer:   Dianne Dun [[id number]]  . amoxicillin-clavulanate (AUGMENTIN) 875-125 MG tablet    Sig: Take 1 tablet by mouth 2 (two) times daily.    Dispense:  14 tablet    Refill:  0    Order Specific Question:   Supervising Provider    Answer:   Dianne Dun [[id number]]    Follow-up: No follow-ups on file.  Alysia Penna, NP

## 2017-04-28 ENCOUNTER — Ambulatory Visit: Payer: No Typology Code available for payment source

## 2017-05-02 ENCOUNTER — Ambulatory Visit: Payer: No Typology Code available for payment source

## 2017-05-31 ENCOUNTER — Encounter: Payer: Self-pay | Admitting: Internal Medicine

## 2017-05-31 ENCOUNTER — Ambulatory Visit (INDEPENDENT_AMBULATORY_CARE_PROVIDER_SITE_OTHER): Payer: No Typology Code available for payment source | Admitting: Internal Medicine

## 2017-05-31 VITALS — BP 100/78 | HR 65 | Ht 68.5 in | Wt 167.4 lb

## 2017-05-31 DIAGNOSIS — R002 Palpitations: Secondary | ICD-10-CM

## 2017-05-31 DIAGNOSIS — I4892 Unspecified atrial flutter: Secondary | ICD-10-CM

## 2017-05-31 NOTE — Patient Instructions (Addendum)

## 2017-05-31 NOTE — Progress Notes (Signed)
HPI [name] returns today for ongoing evaluation and management of palpitations and documented atrial flutter (atypical). The has only had a few episodes since I saw her last several weeks ago. She is chronically on systemic anti-coagulation. She has not had syncope. She remains active exercising. 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 No Known Allergies   Current Outpatient Medications  Medication Sig Dispense Refill  . buPROPion (WELLBUTRIN XL) 300 MG 24 hr tablet Take 1 tablet (300 mg total) by mouth daily. 90 tablet 1  . fluticasone (FLONASE) 50 MCG/ACT nasal spray Place 2 sprays into both nostrils daily. 16 g 0  . levothyroxine (SYNTHROID, LEVOTHROID) 88 MCG tablet Take 88 mcg by mouth daily before breakfast.    . metoprolol succinate (TOPROL-XL) 25 MG 24 hr tablet Take 1 tablet (25 mg total) by mouth as needed. 30 tablet 0  . warfarin (COUMADIN) 6 MG tablet Take 1 tablet daily or as directed by anticoagulation clinic.  90 day (Patient taking differently: Take 6 mg by mouth daily at 6 PM. ) 90 tablet 0   No current facility-administered medications for this visit.      Past Medical History:  Diagnosis Date  . Arthritis   . Clotting disorder (HCC)   . H/O blood clots   . History of chicken pox   . Migraines   . Mitral valve prolapse   . Thyroid disease     ROS:   All systems reviewed and negative except as noted in the HPI.   Past Surgical History:  Procedure Laterality Date  . bladder sur    . CESAREAN SECTION     2  . ELBOW SURGERY  2009  . knee repla Left 07/2016   partial knee replacement   . ROTATOR CUFF REPAIR  2011  . TONSILLECTOMY  ?1968     Family History  Problem Relation Age of Onset  . Protein S deficiency Mother   . Clotting disorder Mother   . Heart disease Father   . Diabetes Father   . Headache Father   . Colon cancer Paternal Grandfather   . Stroke Brother   . Diabetes Paternal Aunt   .  Diabetes Paternal Uncle   . Colon cancer Paternal Grandmother      Social History   Socioeconomic History  . Marital status: Married    Spouse name: Not on file  . Number of children: 3  . Years of education: 59  . Highest education level: Not on file  Occupational History  . Occupation: Technical brewer  Social Needs  . Financial resource strain: Not on file  . Food insecurity:    Worry: Not on file    Inability: Not on file  . Transportation needs:    Medical: Not on file    Non-medical: Not on file  Tobacco Use  . Smoking status: Never Smoker  . Smokeless tobacco: Never Used  Substance and Sexual Activity  . Alcohol use: Yes    Comment: Socially  . Drug use: No  . Sexual activity: Yes  Lifestyle  . Physical activity:    Days per week: Not on file    Minutes per session: Not on file  . Stress: Not on file  Relationships  . Social connections:    Talks on phone: Not on file    Gets together: Not on file    Attends religious service: Not on file    Active member  of club or organization: Not on file    Attends meetings of clubs or organizations: Not on file    Relationship status: Not on file  . Intimate partner violence:    Fear of current or ex partner: Not on file    Emotionally abused: Not on file    Physically abused: Not on file    Forced sexual activity: Not on file  Other Topics Concern  . Not on file  Social History Narrative   Regular exercise-yes   Caffeine Use-yes     BP 100/78   Pulse 65   Ht 5' 8.5" (1.74 m)   Wt 167 lb 6.4 oz (75.9 kg)   BMI 25.08 kg/m   Physical Exam:  Well appearing middle aged woman, NAD HEENT: Unremarkable Neck:  6 cm JVD, no thyromegally Lymphatics:  No adenopathy Back:  No CVA tenderness Lungs:  Clear with no wheezes HEART:  Regular rate rhythm, no murmurs, no rubs, no clicks Abd:  soft, positive bowel sounds, no organomegally, no rebound, no guarding Ext:  2 plus pulses, no edema, no cyanosis, no  clubbing Skin:  No rashes no nodules Neuro:  CN II through XII intact, motor grossly intact  EKG - nsr  Assess/Plan: 1. Palpitations - she has her symptoms mostly controlled. She will continue her medical therapy. I have recommended that if her symptoms worsen, she can take an extra toprol or two as needed. 2. Atrial flutter - she has been mostly asymptomatic for sustained flutter symptoms. She will continue her systemic anti-coagulation. 3. Pulmonary embolism - she will remain on her chronic anti-coagulation for this as well as her atrial arrhythmias.  Melinda Benton.D.

## 2017-06-01 ENCOUNTER — Encounter: Payer: Self-pay | Admitting: Internal Medicine

## 2017-06-02 ENCOUNTER — Ambulatory Visit (INDEPENDENT_AMBULATORY_CARE_PROVIDER_SITE_OTHER): Payer: No Typology Code available for payment source | Admitting: General Practice

## 2017-06-02 DIAGNOSIS — D6859 Other primary thrombophilia: Secondary | ICD-10-CM

## 2017-06-02 LAB — POCT INR: INR: 2.1 (ref 2.0–3.0)

## 2017-06-02 NOTE — Patient Instructions (Addendum)
Pre visit review using our clinic review tool, if applicable. No additional management support is needed unless otherwise documented below in the visit note.  Continue to take 6 mg daily except 3 mg on Fridays.  Re-check in 6 weeks.

## 2017-06-14 ENCOUNTER — Other Ambulatory Visit (HOSPITAL_COMMUNITY): Payer: Self-pay | Admitting: Endocrinology

## 2017-06-14 DIAGNOSIS — E041 Nontoxic single thyroid nodule: Secondary | ICD-10-CM

## 2017-07-04 ENCOUNTER — Ambulatory Visit (HOSPITAL_COMMUNITY): Payer: No Typology Code available for payment source

## 2017-07-13 ENCOUNTER — Ambulatory Visit (HOSPITAL_COMMUNITY)
Admission: RE | Admit: 2017-07-13 | Discharge: 2017-07-13 | Disposition: A | Discharge status: Home / Self Care | Payer: No Typology Code available for payment source | Source: Ambulatory Visit | Attending: Endocrinology | Admitting: Endocrinology

## 2017-07-13 DIAGNOSIS — E042 Nontoxic multinodular goiter: Secondary | ICD-10-CM | POA: Insufficient documentation

## 2017-07-13 DIAGNOSIS — E041 Nontoxic single thyroid nodule: Secondary | ICD-10-CM

## 2017-07-14 ENCOUNTER — Ambulatory Visit: Payer: No Typology Code available for payment source

## 2017-09-08 ENCOUNTER — Ambulatory Visit (INDEPENDENT_AMBULATORY_CARE_PROVIDER_SITE_OTHER): Payer: 59 | Admitting: General Practice

## 2017-09-08 DIAGNOSIS — D6859 Other primary thrombophilia: Secondary | ICD-10-CM

## 2017-09-08 LAB — POCT INR: INR: 3.1 — AB (ref 2.0–3.0)

## 2017-09-08 NOTE — Patient Instructions (Signed)
Pre visit review using our clinic review tool, if applicable. No additional management support is needed unless otherwise documented below in the visit note. Take 1/2 tablet today and then continue to take 6 mg daily except 3 mg on Wednesdays.  Re-check in 6 weeks.

## 2017-09-25 LAB — HM PAP SMEAR: HM Pap smear: NEGATIVE

## 2017-10-31 ENCOUNTER — Ambulatory Visit: Payer: 59

## 2017-11-03 ENCOUNTER — Ambulatory Visit (INDEPENDENT_AMBULATORY_CARE_PROVIDER_SITE_OTHER): Payer: 59 | Admitting: General Practice

## 2017-11-03 DIAGNOSIS — I2699 Other pulmonary embolism without acute cor pulmonale: Secondary | ICD-10-CM

## 2017-11-03 DIAGNOSIS — D6859 Other primary thrombophilia: Secondary | ICD-10-CM | POA: Diagnosis not present

## 2017-11-03 LAB — POCT INR: INR: 1.8 — AB (ref 2.0–3.0)

## 2017-11-03 NOTE — Progress Notes (Signed)
Agree with management.  Melinda Potier J Stefannie Defeo, MD  

## 2017-11-03 NOTE — Patient Instructions (Signed)
Pre visit review using our clinic review tool, if applicable. No additional management support is needed unless otherwise documented below in the visit note. 

## 2017-11-10 ENCOUNTER — Other Ambulatory Visit: Payer: Self-pay | Admitting: Nurse Practitioner

## 2017-11-10 DIAGNOSIS — F411 Generalized anxiety disorder: Secondary | ICD-10-CM

## 2017-12-22 ENCOUNTER — Ambulatory Visit (INDEPENDENT_AMBULATORY_CARE_PROVIDER_SITE_OTHER): Payer: 59 | Admitting: General Practice

## 2017-12-22 DIAGNOSIS — D6859 Other primary thrombophilia: Secondary | ICD-10-CM

## 2017-12-22 LAB — POCT INR: INR: 2.6 (ref 2.0–3.0)

## 2017-12-22 NOTE — Patient Instructions (Addendum)
Pre visit review using our clinic review tool, if applicable. No additional management support is needed unless otherwise documented below in the visit note.  Continue to take 6 mg daily except 3 mg on Wednesdays.  Re-check in 6 weeks.     

## 2018-02-02 ENCOUNTER — Ambulatory Visit (INDEPENDENT_AMBULATORY_CARE_PROVIDER_SITE_OTHER): Payer: 59 | Admitting: General Practice

## 2018-02-02 DIAGNOSIS — D6859 Other primary thrombophilia: Secondary | ICD-10-CM

## 2018-02-02 LAB — POCT INR: INR: 3 (ref 2.0–3.0)

## 2018-02-02 NOTE — Patient Instructions (Addendum)
Pre visit review using our clinic review tool, if applicable. No additional management support is needed unless otherwise documented below in the visit note.  Continue to take 6 mg daily except 3 mg on Wednesdays.  Re-check in 6 weeks.

## 2018-03-06 DIAGNOSIS — M25512 Pain in left shoulder: Secondary | ICD-10-CM | POA: Insufficient documentation

## 2018-03-06 DIAGNOSIS — G8929 Other chronic pain: Secondary | ICD-10-CM | POA: Insufficient documentation

## 2018-03-08 ENCOUNTER — Other Ambulatory Visit: Payer: Self-pay | Admitting: Orthopedic Surgery

## 2018-03-08 DIAGNOSIS — R52 Pain, unspecified: Secondary | ICD-10-CM

## 2018-03-08 DIAGNOSIS — M7542 Impingement syndrome of left shoulder: Secondary | ICD-10-CM

## 2018-03-16 ENCOUNTER — Ambulatory Visit (HOSPITAL_COMMUNITY): Payer: 59

## 2018-03-16 ENCOUNTER — Ambulatory Visit: Payer: 59

## 2018-03-16 ENCOUNTER — Other Ambulatory Visit (HOSPITAL_COMMUNITY): Payer: Self-pay | Admitting: Orthopedic Surgery

## 2018-03-16 DIAGNOSIS — R52 Pain, unspecified: Secondary | ICD-10-CM

## 2018-03-16 DIAGNOSIS — M7542 Impingement syndrome of left shoulder: Secondary | ICD-10-CM

## 2018-03-17 ENCOUNTER — Ambulatory Visit (HOSPITAL_COMMUNITY)
Admission: RE | Admit: 2018-03-17 | Discharge: 2018-03-17 | Disposition: A | Discharge status: Home / Self Care | Payer: No Typology Code available for payment source | Source: Ambulatory Visit | Attending: Orthopedic Surgery | Admitting: Orthopedic Surgery

## 2018-03-17 ENCOUNTER — Other Ambulatory Visit: Payer: Self-pay

## 2018-03-17 DIAGNOSIS — M7542 Impingement syndrome of left shoulder: Secondary | ICD-10-CM | POA: Diagnosis present

## 2018-03-17 DIAGNOSIS — R52 Pain, unspecified: Secondary | ICD-10-CM | POA: Diagnosis not present

## 2018-03-23 ENCOUNTER — Ambulatory Visit: Payer: 59

## 2018-03-27 ENCOUNTER — Ambulatory Visit (INDEPENDENT_AMBULATORY_CARE_PROVIDER_SITE_OTHER): Payer: 59 | Admitting: General Practice

## 2018-03-27 ENCOUNTER — Other Ambulatory Visit: Payer: Self-pay | Admitting: General Practice

## 2018-03-27 ENCOUNTER — Other Ambulatory Visit: Payer: Self-pay

## 2018-03-27 DIAGNOSIS — Z7901 Long term (current) use of anticoagulants: Secondary | ICD-10-CM

## 2018-03-27 DIAGNOSIS — D6859 Other primary thrombophilia: Secondary | ICD-10-CM | POA: Diagnosis not present

## 2018-03-27 LAB — POCT INR: INR: 4.1 — AB (ref 2.0–3.0)

## 2018-03-27 MED ORDER — WARFARIN SODIUM 6 MG PO TABS: ORAL_TABLET | ORAL | 0 refills | Status: DC

## 2018-03-27 NOTE — Patient Instructions (Addendum)
Pre visit review using our clinic review tool, if applicable. No additional management support is needed unless otherwise documented below in the visit note.  Hold coumadin today and tomorrow (3/24 and 3/25) and then continue to take 6 mg daily except 3 mg on Wednesdays.  Re-check in 6 weeks.

## 2018-04-02 LAB — HM PAP SMEAR: HM Pap smear: NEGATIVE

## 2018-04-03 ENCOUNTER — Encounter: Payer: Self-pay | Admitting: Nurse Practitioner

## 2018-04-03 ENCOUNTER — Telehealth: Payer: Self-pay

## 2018-04-03 ENCOUNTER — Other Ambulatory Visit: Payer: Self-pay

## 2018-04-03 ENCOUNTER — Ambulatory Visit (INDEPENDENT_AMBULATORY_CARE_PROVIDER_SITE_OTHER): Payer: 59 | Admitting: Nurse Practitioner

## 2018-04-03 VITALS — Ht 68.5 in | Wt 155.0 lb

## 2018-04-03 DIAGNOSIS — L247 Irritant contact dermatitis due to plants, except food: Secondary | ICD-10-CM

## 2018-04-03 MED ORDER — DIPHENHYDRAMINE-ZINC ACETATE 2-0.1 % EX CREA: 1.0000 "application " | TOPICAL_CREAM | Freq: Three times a day (TID) | CUTANEOUS | 0 refills | Status: DC | PRN

## 2018-04-03 MED ORDER — PREDNISONE 10 MG PO TABS: ORAL_TABLET | ORAL | 0 refills | Status: DC

## 2018-04-03 MED ORDER — CALAMINE EX LOTN: 1.0000 "application " | TOPICAL_LOTION | CUTANEOUS | 0 refills | Status: DC | PRN

## 2018-04-03 NOTE — Telephone Encounter (Signed)
Needs to schedule webex video

## 2018-04-03 NOTE — Progress Notes (Signed)
Virtual Visit via Video Note  I connected with Melinda Benton on 04/03/18 at  3:00 PM EDT by a video enabled telemedicine application and verified that I am speaking with the correct person using two identifiers.   I discussed the limitations of evaluation and management by telemedicine and the availability of in person appointments. The patient expressed understanding and agreed to proceed.  History of Present Illness: Poison Melinda Benton  This is a new problem. The current episode started in the past 7 days. The problem is unchanged. The affected locations include the torso, left arm, right arm, right upper leg, right lower leg, left upper leg and left lower leg. The rash is characterized by itchiness and redness. She was exposed to plant contact. Pertinent negatives include no anorexia, congestion, joint pain, rhinorrhea, shortness of breath or sore throat. Past treatments include anti-itch cream, topical steroids and antihistamine. The treatment provided mild relief.   Observations/Objective: Unable to provide any vital signs Physical Exam  Constitutional: She is oriented to person, place, and time. No distress.  Pulmonary/Chest: Effort normal.  Neurological: She is alert and oriented to person, place, and time.  Skin: Rash noted. Rash is vesicular.      Assessment and Plan: Melinda Benton was seen today for poison ivy.  Diagnoses and all orders for this visit:  Irritant contact dermatitis due to plants, except food -     predniSONE (DELTASONE) 10 MG tablet; Take 4tabs once a day x 2days, then 3tabs once a dayx 2days, then 2tabs once a day x3days, then, 1tab once a day x 2days, then stop -     calamine lotion; Apply 1 application topically as needed for itching. -     diphenhydrAMINE-zinc acetate (BENADRYL EXTRA STRENGTH) cream; Apply 1 application topically 3 (three) times daily as needed for itching.    Follow Up Instructions: Avoid hot showers and sunlight exposure. Hold topical  steroid while taking oral prednisone. Continue topical benadryl or calamine lotion or aloe vera gel as needed.   I discussed the assessment and treatment plan with the patient. The patient was provided an opportunity to ask questions and all were answered. The patient agreed with the plan and demonstrated an understanding of the instructions.   The patient was advised to call back or seek an in-person evaluation if the symptoms worsen or if the condition fails to improve as anticipated.   Alysia Penna, NP

## 2018-04-03 NOTE — Telephone Encounter (Signed)
Admin team please help call and offer webex first then provider will determine from there.

## 2018-04-03 NOTE — Telephone Encounter (Signed)
Copied from CRM [id number]. Topic: Appointment Scheduling - Scheduling Inquiry for Clinic >> Apr 02, 2018 10:27 PM Floria Raveling A wrote: Reason for CRM:   Pt called in and left a vm on the general mailbox- pt stated she would like to make an appt, she has bad poison ivy and would like to come in to get a shot or be seen for it. Please advise   [phone]

## 2018-04-03 NOTE — Patient Instructions (Signed)
Poison Ivy Dermatitis  Poison ivy dermatitis is redness and soreness (inflammation) of the skin. It is caused by a chemical that is found on the leaves of the poison ivy plant. You may also have itching, a rash, and blisters. Symptoms often clear up in 1-2 weeks. You may get this condition by touching a poison ivy plant. You can also get it by touching something that has the chemical on it. This may include animals or objects that have come in contact with the plant. Follow these instructions at home: General instructions  Take or apply over-the-counter and prescription medicines only as told by your doctor.  If you touch poison ivy, wash your skin with soap and cold water right away.  Use hydrocortisone creams or calamine lotion as needed to help with itching.  Take oatmeal baths as needed. Use colloidal oatmeal. You can get this at a pharmacy or grocery store. Follow the instructions on the package.  Do not scratch or rub your skin.  While you have the rash, wash your clothes right after you wear them. Prevention   Know what poison ivy looks like so you can avoid it. This plant has three leaves with flowering branches on a single stem. The leaves are glossy. They have uneven edges that come to a point at the front.  If you have touched poison ivy, wash with soap and water right away. Be sure to wash under your fingernails.  When hiking or camping, wear long pants, a long-sleeved shirt, tall socks, and hiking boots. You can also use a lotion on your skin that helps to prevent contact with the chemical on the plant.  If you think that your clothes or outdoor gear came in contact with poison ivy, rinse them off with a garden hose before you bring them inside your house. Contact a doctor if:  You have open sores in the rash area.  You have more redness, swelling, or pain in the affected area.  You have redness that spreads beyond the rash area.  You have fluid, blood, or pus coming  from the affected area.  You have a fever.  You have a rash over a large area of your body.  You have a rash on your eyes, mouth, or genitals.  Your rash does not get better after a few days. Get help right away if:  Your face swells or your eyes swell shut.  You have trouble breathing.  You have trouble swallowing. This information is not intended to replace advice given to you by your health care provider. Make sure you discuss any questions you have with your health care provider. Document Released: 01/22/2010 Document Revised: 09/13/2017 Document Reviewed: 05/28/2014 Elsevier Interactive Patient Education  2019 [employer]  

## 2018-05-08 ENCOUNTER — Other Ambulatory Visit: Payer: Self-pay

## 2018-05-08 ENCOUNTER — Ambulatory Visit (INDEPENDENT_AMBULATORY_CARE_PROVIDER_SITE_OTHER): Payer: 59 | Admitting: General Practice

## 2018-05-08 DIAGNOSIS — D6859 Other primary thrombophilia: Secondary | ICD-10-CM | POA: Diagnosis not present

## 2018-05-08 LAB — POCT INR: INR: 1.2 — AB (ref 2.0–3.0)

## 2018-05-08 NOTE — Patient Instructions (Addendum)
Pre visit review using our clinic review tool, if applicable. No additional management support is needed unless otherwise documented below in the visit note.  Take 1 1/2 tablets today (5/5) and take 1 tablet tomorrow (5/6) and take 1 1/2 tablets on Thursday(5/7) and then  Start taking 6 mg daily.  Re-check in 2 weeks.

## 2018-05-22 ENCOUNTER — Ambulatory Visit: Payer: 59

## 2018-05-29 ENCOUNTER — Ambulatory Visit: Payer: 59

## 2018-06-05 ENCOUNTER — Ambulatory Visit: Payer: 59

## 2018-06-20 LAB — HM DEXA SCAN

## 2018-06-27 ENCOUNTER — Other Ambulatory Visit: Payer: Self-pay | Admitting: Nurse Practitioner

## 2018-06-27 DIAGNOSIS — Z7901 Long term (current) use of anticoagulants: Secondary | ICD-10-CM

## 2018-06-27 NOTE — Telephone Encounter (Signed)
Needs to schedule appt for INR check with coumadin clinic at [facility]

## 2018-06-27 NOTE — Telephone Encounter (Signed)
Pt schedule with coumadin on 07/03/2018.

## 2018-06-27 NOTE — Telephone Encounter (Signed)
Baldo Ash please advise, last check INR was 05/08/2018 at [facility] but do not see 2 wk follow up.

## 2018-07-03 ENCOUNTER — Ambulatory Visit (INDEPENDENT_AMBULATORY_CARE_PROVIDER_SITE_OTHER): Payer: No Typology Code available for payment source | Admitting: General Practice

## 2018-07-03 ENCOUNTER — Ambulatory Visit: Payer: No Typology Code available for payment source

## 2018-07-03 ENCOUNTER — Other Ambulatory Visit: Payer: Self-pay

## 2018-07-03 DIAGNOSIS — D6859 Other primary thrombophilia: Secondary | ICD-10-CM | POA: Diagnosis not present

## 2018-07-03 LAB — POCT INR: INR: 4.1 — AB (ref 2.0–3.0)

## 2018-07-03 NOTE — Patient Instructions (Signed)
Pre visit review using our clinic review tool, if applicable. No additional management support is needed unless otherwise documented below in the visit note.  Hold coumadin today and take 1/2 tablet tomorrow.  On Thursday continue to take 1 tablet daily except 1/2 on Wednesdays.  Re-check in 4 weeks.

## 2018-07-07 ENCOUNTER — Other Ambulatory Visit: Payer: Self-pay | Admitting: Nurse Practitioner

## 2018-07-07 DIAGNOSIS — Z7901 Long term (current) use of anticoagulants: Secondary | ICD-10-CM

## 2018-07-31 ENCOUNTER — Ambulatory Visit (INDEPENDENT_AMBULATORY_CARE_PROVIDER_SITE_OTHER): Payer: No Typology Code available for payment source | Admitting: General Practice

## 2018-07-31 ENCOUNTER — Other Ambulatory Visit: Payer: Self-pay

## 2018-07-31 DIAGNOSIS — D6859 Other primary thrombophilia: Secondary | ICD-10-CM | POA: Diagnosis not present

## 2018-07-31 LAB — POCT INR: INR: 2.7 (ref 2.0–3.0)

## 2018-07-31 NOTE — Patient Instructions (Addendum)
Pre visit review using our clinic review tool, if applicable. No additional management support is needed unless otherwise documented below in the visit note.  Continue to take 1 tablet daily except 1/2 on Wednesdays.  Re-check in 4 weeks.  

## 2018-08-13 ENCOUNTER — Other Ambulatory Visit: Payer: Self-pay | Admitting: Nurse Practitioner

## 2018-08-13 DIAGNOSIS — Z7901 Long term (current) use of anticoagulants: Secondary | ICD-10-CM

## 2018-08-27 ENCOUNTER — Ambulatory Visit (INDEPENDENT_AMBULATORY_CARE_PROVIDER_SITE_OTHER): Payer: No Typology Code available for payment source | Admitting: General Practice

## 2018-08-27 ENCOUNTER — Other Ambulatory Visit: Payer: Self-pay

## 2018-08-27 DIAGNOSIS — D6859 Other primary thrombophilia: Secondary | ICD-10-CM | POA: Diagnosis not present

## 2018-08-27 LAB — POCT INR: INR: 4.1 — AB (ref 2.0–3.0)

## 2018-08-27 NOTE — Patient Instructions (Addendum)
Pre visit review using our clinic review tool, if applicable. No additional management support is needed unless otherwise documented below in the visit note.  Hold coumadin today and tomorrow and then continue to take 1 tablet daily except 1/2 on Wednesdays.  Re-check in 4 weeks.

## 2018-09-06 ENCOUNTER — Other Ambulatory Visit: Payer: Self-pay | Admitting: Nurse Practitioner

## 2018-09-06 DIAGNOSIS — Z7901 Long term (current) use of anticoagulants: Secondary | ICD-10-CM

## 2018-09-06 NOTE — Telephone Encounter (Signed)
LVM for the pt to call back, need to inform the pt's message below and offer a CPE with [facility].

## 2018-09-06 NOTE — Telephone Encounter (Signed)
Please advise 

## 2018-09-14 ENCOUNTER — Other Ambulatory Visit: Payer: Self-pay

## 2018-09-14 ENCOUNTER — Ambulatory Visit (INDEPENDENT_AMBULATORY_CARE_PROVIDER_SITE_OTHER): Payer: No Typology Code available for payment source | Admitting: General Practice

## 2018-09-14 DIAGNOSIS — D6859 Other primary thrombophilia: Secondary | ICD-10-CM

## 2018-09-14 LAB — POCT INR: INR: 1.8 — AB (ref 2.0–3.0)

## 2018-09-14 NOTE — Patient Instructions (Addendum)
Pre visit review using our clinic review tool, if applicable. No additional management support is needed unless otherwise documented below in the visit note.  Take 1 1/2 tablets today and then continue to take 1 tablet daily except 1/2 on Wednesdays.  Re-check in 6 weeks.

## 2018-09-14 NOTE — Telephone Encounter (Signed)
Left another vm for the pt to call back, need to schedule CPE with PCP.

## 2018-10-13 IMAGING — US US THYROID
1 series · 13 of 25 positions shown · non-contrast
Comparison: 06/05/2015

CLINICAL DATA: Prior ultrasound follow-up.  Thyroid nodules.

EXAM:
THYROID ULTRASOUND
TECHNIQUE: Ultrasound examination of the thyroid gland and adjacent soft
tissues was performed.

[Series 1: us thyroid · 0.05mm/px · 13 of 61 slices shown]
[im 1/61]
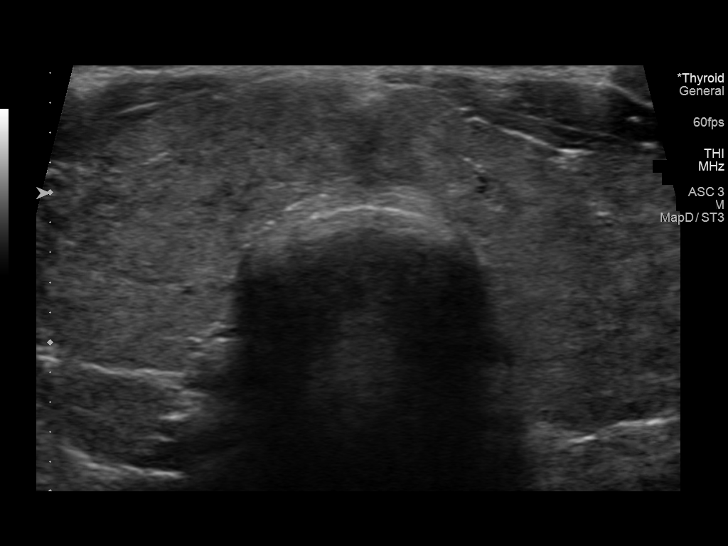
[im 6/61]
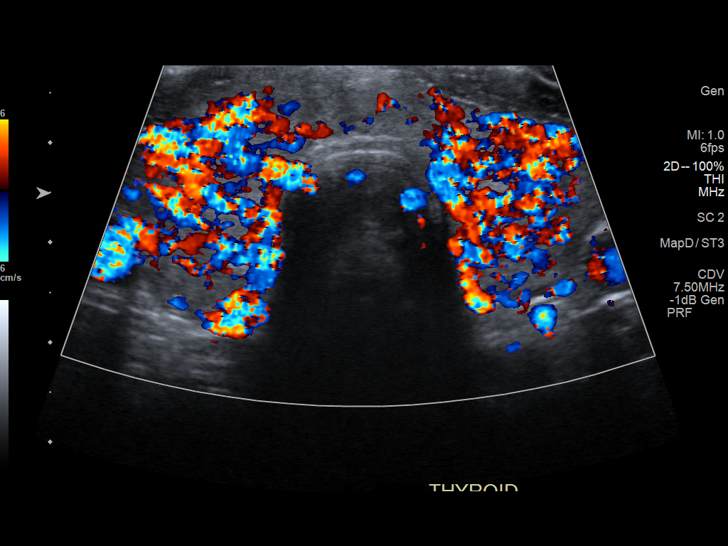
[im 11/61]
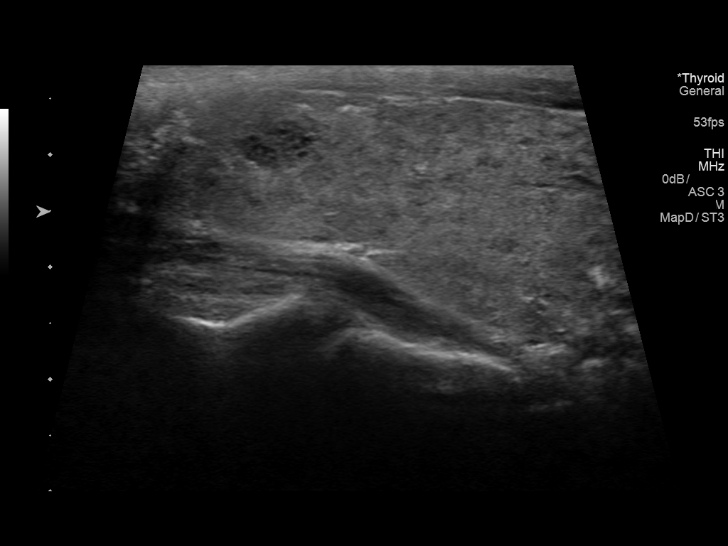
[im 16/61]
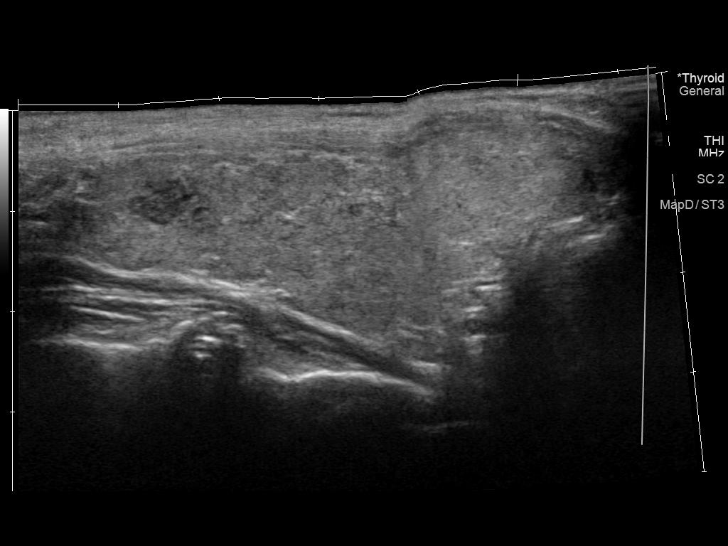
[im 21/61]
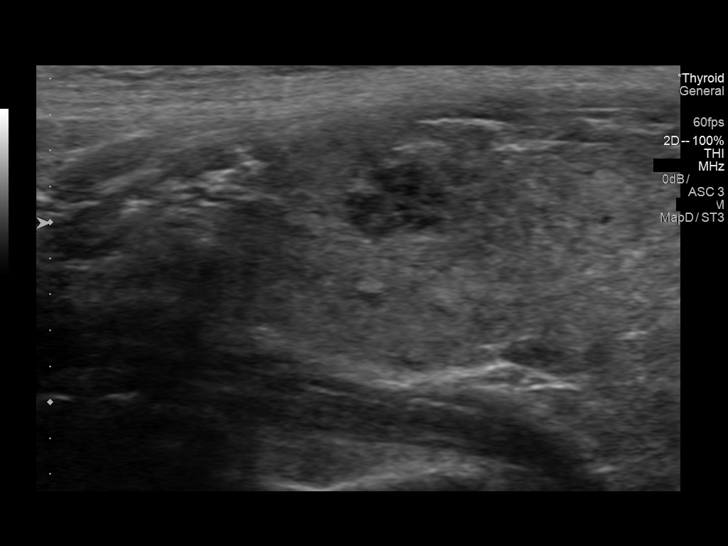
[im 26/61]
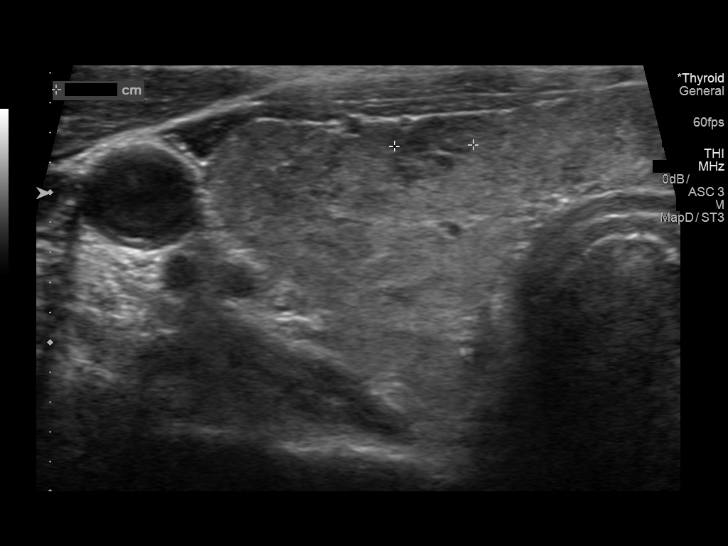
[im 31/61]
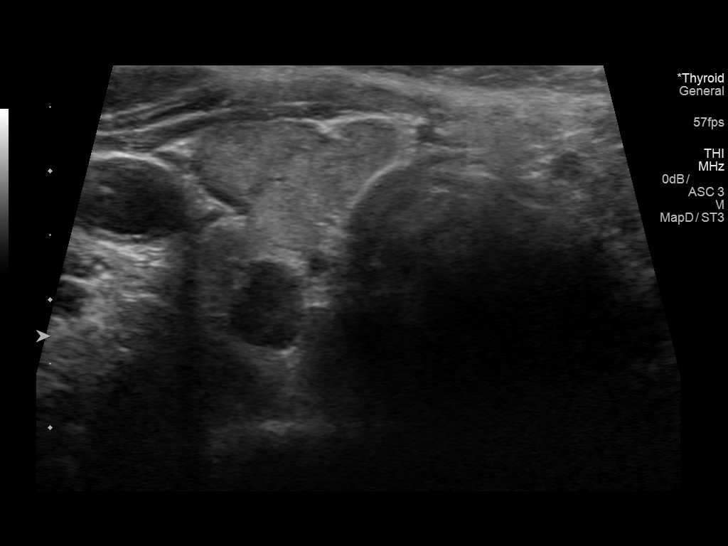
[im 36/61]
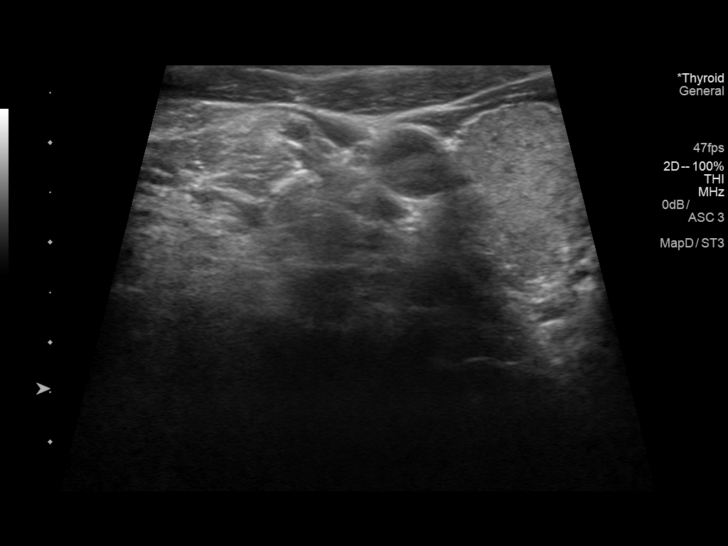
[im 41/61]
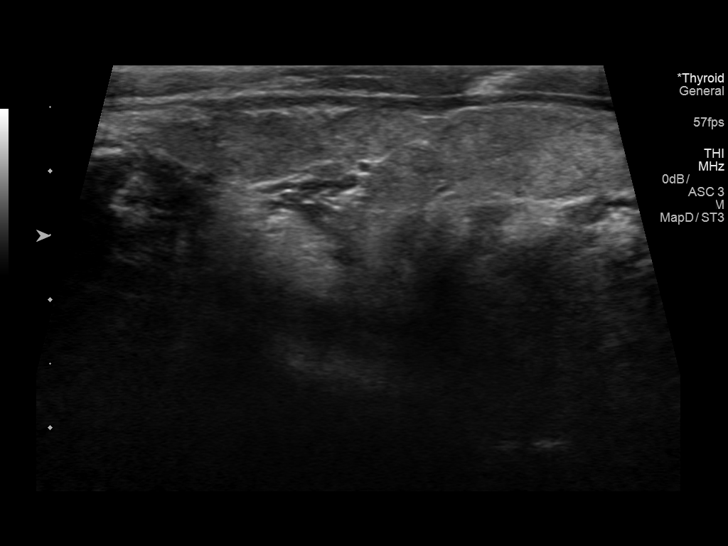
[im 46/61]
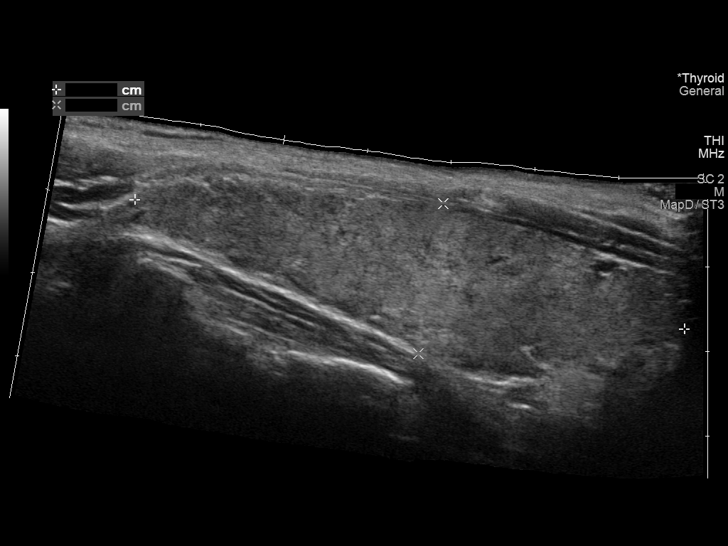
[im 51/61]
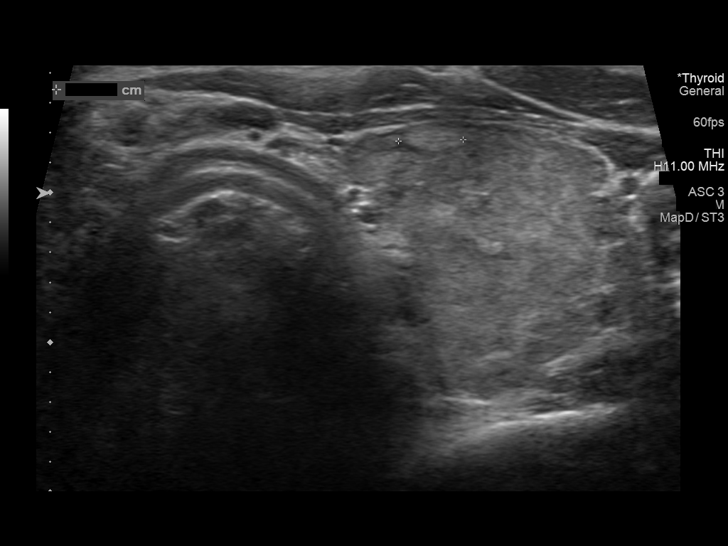
[im 56/61]
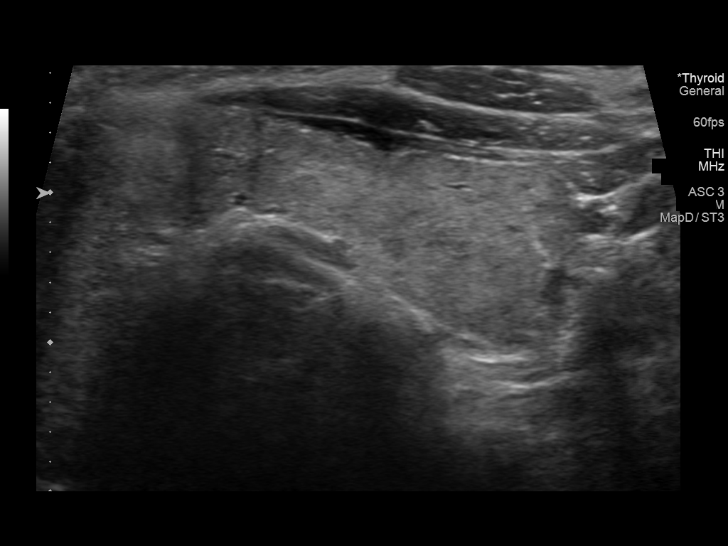
[im 61/61]
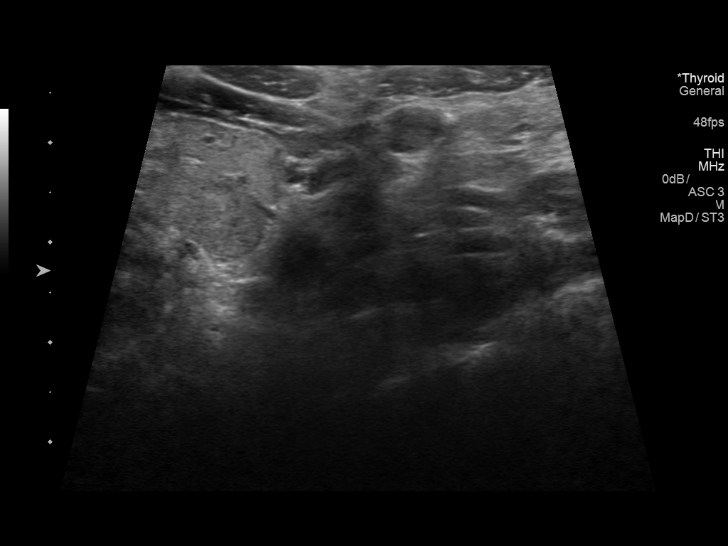

[13 of 25 positions shown; findings below may reference images not displayed]

FINDINGS: Parenchymal Echotexture: Mildly heterogenous

Estimated total number of nodules >/= 1 cm: 0

Number of spongiform nodules >/=  2 cm not described below (TR1): 0

Number of mixed cystic and solid nodules >/= 1.5 cm not described
below (TR2): 0

_________________________________________________________

Isthmus: Measures 0.9 cm in thickness and previously measured
cm.

No discrete nodules are identified within the thyroid isthmus.

_________________________________________________________

Right lobe: Measures 5.8 x 2.0 x 2.7 cm and previously measured
x 2.1 x 2.8 cm.

Hypoechoic nodule along the superior right thyroid lobe measures
x 0.5 x 0.6 cm and previously measured 0.9 x 0.7 x 0.9 cm. No
suspicious echogenic foci in this nodule. Subtle hypoechoic nodule
in the mid right thyroid lobe measures 0.6 x 0.5 x 0.5 cm and
previously measured 0.7 x 0.6 x 0.5 cm. This nodule in the mid right
thyroid lobe is ill-defined without suspicious echogenic foci.
Thyroid tissue is hypervascular.

Nodule # 1:

Location: Right; Inferior

Size: 0.9 x 0.8 x 0.7 cm and previously measured 1.0 x 0.9 x 0.7 cm.

Composition: solid/almost completely solid (2)

Echogenicity: very hypoechoic (3)

Shape: not taller-than-wide (0)

Margins: extra-thyroidal extension (3)

Echogenic foci: none (0)

ACR TI-RADS total points: 8.

ACR TI-RADS risk category: TR5 (>/= 7 points).

ACR TI-RADS recommendations: *Given size (>/= 0.5 - 0.9 cm) and
appearance, a follow-up ultrasound in 1 year should be considered
based on TI-RADS criteria.

_________________________________________________________

Left lobe: Measures 6.6 x 1.8 x 1.8 cm and previously measured 6.3 x
2.0 x 2.1 cm.

Left thyroid tissue is hypervascular. Again noted is a small
hyperechoic nodule in the mid left thyroid lobe measuring up to
cm and previously measured 0.5 cm. No other discrete thyroid
nodules.
IMPRESSION: Small bilateral thyroid nodules with minimal change.

Largest nodule is located along the inferior right thyroid lobe and
may actually be separate from the thyroid tissue. This could
represent a parathyroid adenoma. This nodule meets criteria for 1
year follow-up.

Thyromegaly with hypervascular tissue.

The above is in keeping with the ACR TI-RADS recommendations - [HOSPITAL] 5080;[DATE].

## 2018-10-26 ENCOUNTER — Ambulatory Visit (INDEPENDENT_AMBULATORY_CARE_PROVIDER_SITE_OTHER): Payer: No Typology Code available for payment source | Admitting: General Practice

## 2018-10-26 ENCOUNTER — Other Ambulatory Visit: Payer: Self-pay

## 2018-10-26 DIAGNOSIS — D6859 Other primary thrombophilia: Secondary | ICD-10-CM

## 2018-10-26 LAB — POCT INR: INR: 3.1 — AB (ref 2.0–3.0)

## 2018-10-26 NOTE — Patient Instructions (Addendum)
Pre visit review using our clinic review tool, if applicable. No additional management support is needed unless otherwise documented below in the visit note.  Hold coumadin today (10/23) and then continue to take 1 tablet daily except 1/2 on Wednesdays.  Re-check in 6 weeks.

## 2018-10-28 ENCOUNTER — Other Ambulatory Visit: Payer: Self-pay | Admitting: Nurse Practitioner

## 2018-10-28 ENCOUNTER — Telehealth: Payer: Self-pay | Admitting: Nurse Practitioner

## 2018-10-28 DIAGNOSIS — Z7901 Long term (current) use of anticoagulants: Secondary | ICD-10-CM

## 2018-10-28 NOTE — Telephone Encounter (Signed)
She needs to schedule f46f appt with me for CPE in order for me to continue refilling coumadin. She needs to schedule appt before her next appt with coumadin clinic.

## 2018-10-29 NOTE — Telephone Encounter (Signed)
Please help call and offer an CPE with PCP.

## 2018-10-29 NOTE — Telephone Encounter (Signed)
Tried to call but had to leave a message for pt to call back

## 2018-11-05 ENCOUNTER — Other Ambulatory Visit: Payer: Self-pay

## 2018-11-05 ENCOUNTER — Encounter: Payer: Self-pay | Admitting: Nurse Practitioner

## 2018-11-05 ENCOUNTER — Ambulatory Visit (INDEPENDENT_AMBULATORY_CARE_PROVIDER_SITE_OTHER): Payer: No Typology Code available for payment source | Admitting: Nurse Practitioner

## 2018-11-05 VITALS — BP 108/68 | HR 73 | Temp 96.9°F | Ht 68.07 in | Wt 173.8 lb

## 2018-11-05 DIAGNOSIS — F5102 Adjustment insomnia: Secondary | ICD-10-CM | POA: Diagnosis not present

## 2018-11-05 DIAGNOSIS — K219 Gastro-esophageal reflux disease without esophagitis: Secondary | ICD-10-CM | POA: Diagnosis not present

## 2018-11-05 DIAGNOSIS — E039 Hypothyroidism, unspecified: Secondary | ICD-10-CM | POA: Insufficient documentation

## 2018-11-05 DIAGNOSIS — R002 Palpitations: Secondary | ICD-10-CM | POA: Diagnosis not present

## 2018-11-05 DIAGNOSIS — F411 Generalized anxiety disorder: Secondary | ICD-10-CM | POA: Diagnosis not present

## 2018-11-05 DIAGNOSIS — Z0001 Encounter for general adult medical examination with abnormal findings: Secondary | ICD-10-CM | POA: Diagnosis not present

## 2018-11-05 DIAGNOSIS — Z800 Family history of malignant neoplasm of digestive organs: Secondary | ICD-10-CM | POA: Insufficient documentation

## 2018-11-05 DIAGNOSIS — D6859 Other primary thrombophilia: Secondary | ICD-10-CM

## 2018-11-05 MED ORDER — PANTOPRAZOLE SODIUM 20 MG PO TBEC: 20.0000 mg | DELAYED_RELEASE_TABLET | Freq: Every day | ORAL | 5 refills | Status: DC

## 2018-11-05 MED ORDER — METOPROLOL SUCCINATE ER 25 MG PO TB24: 25.0000 mg | ORAL_TABLET | Freq: Every day | ORAL | 5 refills | Status: DC | PRN

## 2018-11-05 MED ORDER — MELATONIN 5 MG PO TABS: 5.0000 mg | ORAL_TABLET | Freq: Every evening | ORAL | 0 refills | Status: DC | PRN

## 2018-11-05 NOTE — Patient Instructions (Addendum)
Have lab, PAP and mammogram results faxed to me. Have genetic testing results faxed to me. Need to schedule colonoscopy.  Let me know if melatonin does not help with insomnia. Will send trazodone for prn use.  Use pantoprazole once daily x 1week then as needed  Schedule appt with Dr. Talmage Nap (endocrinology).  Health Maintenance, Female Adopting a healthy lifestyle and getting preventive care are important in promoting health and wellness. Ask your health care provider about:  The right schedule for you to have regular tests and exams.  Things you can do on your own to prevent diseases and keep yourself healthy. What should I know about diet, weight, and exercise? Eat a healthy diet   Eat a diet that includes plenty of vegetables, fruits, low-fat dairy products, and lean protein.  Do not eat a lot of foods that are high in solid fats, added sugars, or sodium. Maintain a healthy weight Body mass index (BMI) is used to identify weight problems. It estimates body fat based on height and weight. Your health care provider can help determine your BMI and help you achieve or maintain a healthy weight. Get regular exercise Get regular exercise. This is one of the most important things you can do for your health. Most adults should:  Exercise for at least 150 minutes each week. The exercise should increase your heart rate and make you sweat (moderate-intensity exercise).  Do strengthening exercises at least twice a week. This is in addition to the moderate-intensity exercise.  Spend less time sitting. Even light physical activity can be beneficial. Watch cholesterol and blood lipids Have your blood tested for lipids and cholesterol at 58 years of age, then have this test every 5 years. Have your cholesterol levels checked more often if:  Your lipid or cholesterol levels are high.  You are older than 58 years of age.  You are at high risk for heart disease. What should I know about  cancer screening? Depending on your health history and family history, you may need to have cancer screening at various ages. This may include screening for:  Breast cancer.  Cervical cancer.  Colorectal cancer.  Skin cancer.  Lung cancer. What should I know about heart disease, diabetes, and high blood pressure? Blood pressure and heart disease  High blood pressure causes heart disease and increases the risk of stroke. This is more likely to develop in people who have high blood pressure readings, are of African descent, or are overweight.  Have your blood pressure checked: ? Every 3-5 years if you are 33-41 years of age. ? Every year if you are 74 years old or older. Diabetes Have regular diabetes screenings. This checks your fasting blood sugar level. Have the screening done:  Once every three years after age 31 if you are at a normal weight and have a low risk for diabetes.  More often and at a younger age if you are overweight or have a high risk for diabetes. What should I know about preventing infection? Hepatitis B If you have a higher risk for hepatitis B, you should be screened for this virus. Talk with your health care provider to find out if you are at risk for hepatitis B infection. Hepatitis C Testing is recommended for:  Everyone born from [birth year] through 1965.  Anyone with known risk factors for hepatitis C. Sexually transmitted infections (STIs)  Get screened for STIs, including gonorrhea and chlamydia, if: ? You are sexually active and are younger than 58 years  of age. ? You are older than 58 years of age and your health care provider tells you that you are at risk for this type of infection. ? Your sexual activity has changed since you were last screened, and you are at increased risk for chlamydia or gonorrhea. Ask your health care provider if you are at risk.  Ask your health care provider about whether you are at high risk for HIV. Your health care  provider may recommend a prescription medicine to help prevent HIV infection. If you choose to take medicine to prevent HIV, you should first get tested for HIV. You should then be tested every 3 months for as long as you are taking the medicine. Pregnancy  If you are about to stop having your period (premenopausal) and you may become pregnant, seek counseling before you get pregnant.  Take 400 to 800 micrograms (mcg) of folic acid every day if you become pregnant.  Ask for birth control (contraception) if you want to prevent pregnancy. Osteoporosis and menopause Osteoporosis is a disease in which the bones lose minerals and strength with aging. This can result in bone fractures. If you are 59 years old or older, or if you are at risk for osteoporosis and fractures, ask your health care provider if you should:  Be screened for bone loss.  Take a calcium or vitamin D supplement to lower your risk of fractures.  Be given hormone replacement therapy (HRT) to treat symptoms of menopause. Follow these instructions at home: Lifestyle  Do not use any products that contain nicotine or tobacco, such as cigarettes, e-cigarettes, and chewing tobacco. If you need help quitting, ask your health care provider.  Do not use street drugs.  Do not share needles.  Ask your health care provider for help if you need support or information about quitting drugs. Alcohol use  Do not drink alcohol if: ? Your health care provider tells you not to drink. ? You are pregnant, may be pregnant, or are planning to become pregnant.  If you drink alcohol: ? Limit how much you use to 0-1 drink a day. ? Limit intake if you are breastfeeding.  Be aware of how much alcohol is in your drink. In the [place] one drink equals one 12 oz bottle of beer (355 mL), one 5 oz glass of wine (148 mL), or one 1 oz glass of hard liquor (44 mL). General instructions  Schedule regular health, dental, and eye exams.  Stay current  with your vaccines.  Tell your health care provider if: ? You often feel depressed. ? You have ever been abused or do not feel safe at home. Summary  Adopting a healthy lifestyle and getting preventive care are important in promoting health and wellness.  Follow your health care provider's instructions about healthy diet, exercising, and getting tested or screened for diseases.  Follow your health care provider's instructions on monitoring your cholesterol and blood pressure. This information is not intended to replace advice given to you by your health care provider. Make sure you discuss any questions you have with your health care provider. Document Released: 07/05/2010 Document Revised: 12/13/2017 Document Reviewed: 12/13/2017 Elsevier Patient Education  2020 [employer].

## 2018-11-05 NOTE — Assessment & Plan Note (Addendum)
Ongoing coumadin use  Has INR check once a month. Hx of PE and protein S deficiency. She is not interested in switching to plavix and/or xarelto.

## 2018-11-05 NOTE — Assessment & Plan Note (Signed)
Managed by Dr. Annita Brod with [facility]. Current use of wellbutrin

## 2018-11-05 NOTE — Progress Notes (Signed)
Subjective:    Patient ID: Melinda Benton, female    DOB: Dec 20, 1960, 58 y.o.   MRN: [id number]  Patient presents today for complete physical and eval of chronic conditions  She states she has lab appt with gyn tomorrow (lipid panel, TSH and CMP)  Gastroesophageal Reflux She complains of choking, globus sensation, heartburn and a hoarse voice. She reports no abdominal pain, no belching, no chest pain, no coughing, no dysphagia, no early satiety, no nausea, no sore throat, no stridor, no tooth decay, no water brash or no wheezing. This is a chronic problem. The current episode started more than 1 year ago. The problem occurs occasionally. The problem has been waxing and waning. The heartburn duration is an hour. The heartburn is located in the substernum and abdomen. The heartburn is of mild intensity. The heartburn does not wake her from sleep. The heartburn does not limit her activity. The heartburn doesn't change with position. The symptoms are aggravated by certain foods. Pertinent negatives include no anemia, fatigue, melena, muscle weakness or orthopnea. There are no known risk factors. She has tried a PPI for the symptoms. The treatment provided significant relief. Past procedures do not include an abdominal ultrasound, an EGD, esophageal manometry, H. pylori antibody titer or a UGI. Past invasive treatments do not include gastroplasty, gastroplication or reflux surgery.  Insomnia Primary symptoms: fragmented sleep, sleep disturbance, malaise/fatigue.  The current episode started more than one year. The onset quality is gradual. The problem occurs intermittently. The problem has been waxing and waning since onset. The symptoms are aggravated by anxiety and bed partner. How many beverages per day that contain caffeine: 0 - 1.  Types of beverages you drink: coffee. The symptoms are relieved by quiet environment and relaxation. Past treatments include medication. The treatment provided mild  relief. Typical bedtime:  8-10 P.M..  How long after going to bed to you fall asleep: 15-30 minutes.   PMH includes: no hypertension, depression, no family stress or anxiety, no restless leg syndrome, work related stressors, no chronic pain, no apnea. Prior diagnostic workup includes:  Blood work.   Hypothyroidism: managed by endocrinology: Dr. Chalmers Cater  Sexual History (orientation,birth control, marital status, STD):PAP-uptodate done by Dr. Radene Knee Mammogram: scheduled for tomorrow  FHx of colon cancer: reports last colonoscopy was 79yrs ago, she has genetic test ordered by GYN, normal results per patient. Will like to know if she needs screen every 22yrs or 75yrs  Depression/Suicide: current use of wellbutrin, stable mood Depression screen [facility] 2/9 11/05/2018 11/05/2018 04/03/2018 03/20/2017 05/03/2012  Decreased Interest 0 0 0 0 0  Down, Depressed, Hopeless 0 0 0 0 0  PHQ - 2 Score 0 0 0 0 0  Altered sleeping 2 - - - -  Tired, decreased energy 1 - - - -  Change in appetite 2 - - - -  Feeling bad or failure about yourself  0 - - - -  Trouble concentrating 0 - - - -  Moving slowly or fidgety/restless 0 - - - -  Suicidal thoughts 0 - - - -  PHQ-9 Score 5 - - - -  Difficult doing work/chores Somewhat difficult - - - -   GAD 7 : Generalized Anxiety Score 11/05/2018  Nervous, Anxious, on Edge 2  Control/stop worrying 0  Worry too much - different things 0  Trouble relaxing 2  Restless 0  Easily annoyed or irritable 1  Afraid - awful might happen 0  Total GAD 7 Score 5  Anxiety Difficulty Somewhat difficult   Vision:up to date  Dental:up to date  Immunizations: (TDAP, Hep C screen, Pneumovax, Influenza, zoster)  Health Maintenance  Topic Date Due  . Colon Cancer Screening  05/31/2010  . Mammogram  06/28/2018  .  Hepatitis C: One time screening is recommended by Center for Disease Control  (CDC) for  adults born from [birth year] through 1965.   11/05/2019*  . HIV Screening  11/05/2019*  . Tetanus  Vaccine  01/04/2019  . Pap Smear  06/28/2019  . Flu Shot  Completed  *Topic was postponed. The date shown is not the original due date.   Diet:regular.  Weight:  Wt Readings from Last 3 Encounters:  11/05/18 173 lb 12.8 oz (78.8 kg)  04/03/18 155 lb (70.3 kg)  05/31/17 167 lb 6.4 oz (75.9 kg)    Exercise:walking  Fall Risk: Fall Risk  11/05/2018 04/03/2018 03/20/2017 05/03/2012  Falls in the past year? 0 0 No No   Advanced Directive: Advanced Directives 01/11/2017  Does Patient Have a Medical Advance Directive? Yes  Type of Estate agent of [place];Living will  Does patient want to make changes to medical advance directive? No - Patient declined  Copy of Healthcare Power of Attorney in Chart? No - copy requested  Would patient like information on creating a medical advance directive? -     Medications and allergies reviewed with patient and updated if appropriate.  Patient Active Problem List   Diagnosis Date Noted  . Family history of colon cancer 11/05/2018  . Hypothyroidism 11/05/2018  . Chronic left shoulder pain 03/06/2018  . GAD (generalized anxiety disorder) 03/20/2017  . 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 Chronic pain of right knee 06/07/2016  . Spider varicose veins 09/01/2015  . Cough 05/19/2014  . Palpitations 10/29/2013  . Arthritis 10/09/2013  . Pulmonary embolus (HCC) 02/15/2010  . Hypercoagulable state (HCC) 02/15/2010    Current Outpatient Medications on File Prior to Visit  Medication Sig Dispense Refill  . buPROPion (WELLBUTRIN XL) 300 MG 24 hr tablet Take 1 tablet (300 mg total) by mouth daily. Need Office Visit for additional refills 90 tablet 0  . calamine lotion Apply 1 application topically as needed for itching. 120 mL 0  . levothyroxine (SYNTHROID, LEVOTHROID) 88 MCG tablet Take 88 mcg by mouth daily before breakfast.    . warfarin (COUMADIN) 6 MG tablet TAKE AS DIRECTED BY ANTICOAGULATION CLINIC. **NEEDS  OFFICE VISIT WITH PCP FOR ADDITIONAL REFILLS** 27 tablet 0   No current facility-administered medications on file prior to visit.     Past Medical History:  Diagnosis Date  . Arthritis   . Clotting disorder (HCC)   . H/O blood clots   . History of chicken pox   . Migraines   . Mitral valve prolapse   . Thyroid disease     Past Surgical History:  Procedure Laterality Date  . bladder sur    . CESAREAN SECTION     2  . ELBOW SURGERY  2009  . knee repla Left 07/2016   partial knee replacement   . ROTATOR CUFF REPAIR  2011  . TONSILLECTOMY  ?1968    Social History   Socioeconomic History  . Marital status: Married    Spouse name: Not on file  . Number of children: 3  . Years of education: 66  . Highest education level: Not on file  Occupational History  . Occupation: Technical brewer  Social Needs  . Physicist, medical  strain: Not on file  . Food insecurity    Worry: Not on file    Inability: Not on file  . Transportation needs    Medical: Not on file    Non-medical: Not on file  Tobacco Use  . Smoking status: Never Smoker  . Smokeless tobacco: Never Used  Substance and Sexual Activity  . Alcohol use: Yes    Comment: Socially  . Drug use: No  . Sexual activity: Yes  Lifestyle  . Physical activity    Days per week: Not on file    Minutes per session: Not on file  . Stress: Not on file  Relationships  . Social Musicianconnections    Talks on phone: Not on file    Gets together: Not on file    Attends religious service: Not on file    Active member of club or organization: Not on file    Attends meetings of clubs or organizations: Not on file    Relationship status: Not on file  Other Topics Concern  . Not on file  Social History Narrative   Regular exercise-yes   Caffeine Use-yes    Family History  Problem Relation Age of Onset  . Protein S deficiency Mother   . Clotting disorder Mother   . Heart disease Father   . Diabetes Father   . Headache  Father   . Colon cancer Paternal Grandfather   . Stroke Brother   . Diabetes Paternal Aunt   . Diabetes Paternal Uncle   . Colon cancer Paternal Grandmother        Review of Systems  Constitutional: Positive for malaise/fatigue. Negative for fatigue.  HENT: Positive for hoarse voice. Negative for sore throat.   Respiratory: Positive for choking. Negative for apnea, cough and wheezing.   Cardiovascular: Negative for chest pain.  Gastrointestinal: Positive for heartburn. Negative for abdominal pain, dysphagia, melena and nausea.  Musculoskeletal: Negative for muscle weakness.  Psychiatric/Behavioral: Positive for depression and sleep disturbance. The patient has insomnia.    Objective:   Vitals:   11/05/18 1315  BP: 108/68  Pulse: 73  Temp: (!) 96.9 F (36.1 C)  SpO2: 98%    Body mass index is 26.37 kg/m.   Physical Examination:  Physical Exam Vitals signs reviewed.  Constitutional:      Appearance: Normal appearance.  HENT:     Right Ear: Tympanic membrane, ear canal and external ear normal.     Left Ear: Tympanic membrane, ear canal and external ear normal.  Eyes:     Extraocular Movements: Extraocular movements intact.     Pupils: Pupils are equal, round, and reactive to light.  Neck:     Musculoskeletal: Normal range of motion and neck supple.  Cardiovascular:     Rate and Rhythm: Normal rate and regular rhythm.     Pulses: Normal pulses.     Heart sounds: Normal heart sounds.  Pulmonary:     Effort: Pulmonary effort is normal.     Breath sounds: Normal breath sounds.  Abdominal:     General: Bowel sounds are normal.     Palpations: Abdomen is soft.  Genitourinary:    Comments: Patient deferred breast and pelvic exam to GYN Musculoskeletal: Normal range of motion.     Right lower leg: No edema.     Left lower leg: No edema.  Neurological:     Mental Status: She is alert and oriented to person, place, and time.  Psychiatric:  Mood and Affect:  Mood normal.        Behavior: Behavior normal.        Thought Content: Thought content normal.    ASSESSMENT and PLAN:  Melinda Benton was seen today for annual exam.  Diagnoses and all orders for this visit:  Encounter for preventative adult health care exam with abnormal findings  GAD (generalized anxiety disorder)  Adjustment insomnia -     Melatonin 5 MG TABS; Take 1-2 tablets (5-10 mg total) by mouth at bedtime as needed.  Gastroesophageal reflux disease without esophagitis -     pantoprazole (PROTONIX) 20 MG tablet; Take 1 tablet (20 mg total) by mouth daily.  Palpitations -     metoprolol succinate (TOPROL-XL) 25 MG 24 hr tablet; Take 1 tablet (25 mg total) by mouth daily as needed.  Hypercoagulable state (HCC)   Palpitations Intermittent, triggered by stress and inadequate sleep. Last episode 2weeks ago.  Have sensation of irregular heart rhythm, relieved by metoprolol ER 25mg  prn. Needs refill  GAD (generalized anxiety disorder) Managed by Dr. with [facility]. Current use of wellbutrin  Hypercoagulable state Ongoing coumadin use  Has INR check once a month. Hx of PE and protein S deficiency. She is not interested in switching to plavix and/or xarelto.      Problem List Items Addressed This Visit      Other   GAD (generalized anxiety disorder)    Managed by Dr. Sharion Dove with [facility]. Current use of wellbutrin      Hypercoagulable state (HCC)    Ongoing coumadin use  Has INR check once a month. Hx of PE and protein S deficiency. She is not interested in switching to plavix and/or xarelto.      Palpitations    Intermittent, triggered by stress and inadequate sleep. Last episode 2weeks ago.  Have sensation of irregular heart rhythm, relieved by metoprolol ER 25mg  prn. Needs refill      Relevant Medications   metoprolol succinate (TOPROL-XL) 25 MG 24 hr tablet    Other Visit Diagnoses    Encounter for  preventative adult health care exam with abnormal findings    -  Primary   Adjustment insomnia       Relevant Medications   Melatonin 5 MG TABS   Gastroesophageal reflux disease without esophagitis       Relevant Medications   pantoprazole (PROTONIX) 20 MG tablet      Follow up: Return in about 1 year (around 11/05/2019) for CPE (fasting).  , NP

## 2018-11-05 NOTE — Assessment & Plan Note (Signed)
Intermittent, triggered by stress and inadequate sleep. Last episode 2weeks ago.  Have sensation of irregular heart rhythm, relieved by metoprolol ER 25mg  prn. Needs refill

## 2018-11-06 LAB — BASIC METABOLIC PANEL
BUN: 13 (ref 4–21)
CO2: 25 — AB (ref 13–22)
Chloride: 101 (ref 99–108)
Creatinine: 0.9 (ref 0.5–1.1)
Glucose: 92
Potassium: 4.4 (ref 3.4–5.3)
Sodium: 141 (ref 137–147)

## 2018-11-06 LAB — LIPID PANEL
Cholesterol: 233 — AB (ref 0–200)
HDL: 61 (ref 35–70)
LDL Cholesterol: 150
LDl/HDL Ratio: 2.5
Triglycerides: 124 (ref 40–160)

## 2018-11-06 LAB — HM MAMMOGRAPHY

## 2018-11-06 LAB — TSH: TSH: 1.67 (ref 0.41–5.90)

## 2018-11-06 LAB — HEPATIC FUNCTION PANEL
ALT: 15 (ref 7–35)
AST: 19 (ref 13–35)
Alkaline Phosphatase: 66 (ref 25–125)
Bilirubin, Total: 0.4

## 2018-11-06 LAB — COMPREHENSIVE METABOLIC PANEL
Albumin: 4.6 (ref 3.5–5.0)
Calcium: 9.5 (ref 8.7–10.7)
GFR calc Af Amer: 78
GFR calc non Af Amer: 68

## 2018-11-06 LAB — CBC AND DIFFERENTIAL: Hemoglobin: 13 (ref 12.0–16.0)

## 2018-11-07 LAB — HM MAMMOGRAPHY

## 2018-11-13 ENCOUNTER — Encounter: Payer: Self-pay | Admitting: Nurse Practitioner

## 2018-11-13 ENCOUNTER — Telehealth: Payer: Self-pay | Admitting: Nurse Practitioner

## 2018-11-13 DIAGNOSIS — M85852 Other specified disorders of bone density and structure, left thigh: Secondary | ICD-10-CM | POA: Insufficient documentation

## 2018-11-13 DIAGNOSIS — M8588 Other specified disorders of bone density and structure, other site: Secondary | ICD-10-CM | POA: Insufficient documentation

## 2018-11-13 DIAGNOSIS — M85851 Other specified disorders of bone density and structure, right thigh: Secondary | ICD-10-CM | POA: Insufficient documentation

## 2018-11-13 NOTE — Telephone Encounter (Signed)
Please call M. Cannata to schedule virtual appt. We need to discuss recent lab results from GYN: Dr. Radene Knee.

## 2018-11-13 NOTE — Telephone Encounter (Signed)
Please help.

## 2018-11-14 ENCOUNTER — Encounter: Payer: Self-pay | Admitting: Nurse Practitioner

## 2018-11-14 LAB — T4
Free Thyroxine Index: 2.6
T3 Uptake: 32
Thyroxine (T4): 8.2

## 2018-11-14 NOTE — Progress Notes (Signed)
Abstracted result and sent to scan  

## 2018-11-14 NOTE — Telephone Encounter (Signed)
I called and left message on patient voicemail to call office per [facility] request and schedule a virtual visit to follow up on lab results from Dr. Radene Knee. Wilfred Lacy  Has available appointments for virtual visits on Thursday.

## 2018-11-26 ENCOUNTER — Other Ambulatory Visit: Payer: Self-pay | Admitting: Nurse Practitioner

## 2018-11-26 DIAGNOSIS — Z7901 Long term (current) use of anticoagulants: Secondary | ICD-10-CM

## 2018-12-04 ENCOUNTER — Ambulatory Visit: Payer: No Typology Code available for payment source

## 2018-12-14 ENCOUNTER — Ambulatory Visit: Payer: No Typology Code available for payment source

## 2018-12-21 ENCOUNTER — Ambulatory Visit: Payer: No Typology Code available for payment source

## 2018-12-25 ENCOUNTER — Other Ambulatory Visit: Payer: Self-pay

## 2018-12-25 ENCOUNTER — Ambulatory Visit (INDEPENDENT_AMBULATORY_CARE_PROVIDER_SITE_OTHER): Payer: No Typology Code available for payment source | Admitting: General Practice

## 2018-12-25 DIAGNOSIS — D6859 Other primary thrombophilia: Secondary | ICD-10-CM | POA: Diagnosis not present

## 2018-12-25 LAB — POCT INR: INR: 4.6 — AB (ref 2.0–3.0)

## 2018-12-25 NOTE — Progress Notes (Signed)
Medical screening examination/treatment/procedure(s) were performed by non-physician practitioner and as supervising physician I was immediately available for consultation/collaboration. I agree with above. Maleya Leever, MD   

## 2018-12-25 NOTE — Patient Instructions (Addendum)
.  lbpcmh  Hold coumadin today and tomorrow (12/22 and 12/23) and then change dosage and take 1 tablet daily except 1/2 on Wednesdays and Saturdays.  Re-check in 3 weeks.

## 2019-01-15 ENCOUNTER — Ambulatory Visit: Payer: No Typology Code available for payment source

## 2019-01-22 ENCOUNTER — Ambulatory Visit (INDEPENDENT_AMBULATORY_CARE_PROVIDER_SITE_OTHER): Payer: PRIVATE HEALTH INSURANCE | Admitting: General Practice

## 2019-01-22 ENCOUNTER — Other Ambulatory Visit: Payer: Self-pay

## 2019-01-22 DIAGNOSIS — D6859 Other primary thrombophilia: Secondary | ICD-10-CM | POA: Diagnosis not present

## 2019-01-22 LAB — POCT INR: INR: 2.3 (ref 2.0–3.0)

## 2019-01-22 NOTE — Progress Notes (Signed)
Medical screening examination/treatment/procedure(s) were performed by non-physician practitioner and as supervising physician I was immediately available for consultation/collaboration. I agree with above. Edvin Albus, MD   

## 2019-01-22 NOTE — Patient Instructions (Addendum)
Pre visit review using our clinic review tool, if applicable. No additional management support is needed unless otherwise documented below in the visit note.  Continue to take 1 tablet daily except 1/2 on Wednesdays and Saturdays.  Re-check in 6 weeks

## 2019-02-15 ENCOUNTER — Other Ambulatory Visit: Payer: Self-pay | Admitting: Nurse Practitioner

## 2019-02-15 DIAGNOSIS — Z7901 Long term (current) use of anticoagulants: Secondary | ICD-10-CM

## 2019-03-05 ENCOUNTER — Other Ambulatory Visit: Payer: Self-pay

## 2019-03-05 ENCOUNTER — Ambulatory Visit (INDEPENDENT_AMBULATORY_CARE_PROVIDER_SITE_OTHER): Payer: PRIVATE HEALTH INSURANCE | Admitting: General Practice

## 2019-03-05 DIAGNOSIS — D6859 Other primary thrombophilia: Secondary | ICD-10-CM | POA: Diagnosis not present

## 2019-03-05 LAB — POCT INR: INR: 2.7 (ref 2.0–3.0)

## 2019-03-05 NOTE — Patient Instructions (Signed)
Pre visit review using our clinic review tool, if applicable. No additional management support is needed unless otherwise documented below in the visit note.  Continue to take 1 tablet daily except 1/2 on Wednesdays and Saturdays.  Re-check in 6 to 8 weeks per patient request.    

## 2019-03-05 NOTE — Progress Notes (Signed)
Medical screening examination/treatment/procedure(s) were performed by non-physician practitioner and as supervising physician I was immediately available for consultation/collaboration. I agree with above. Foday Cone, MD   

## 2019-03-17 ENCOUNTER — Other Ambulatory Visit: Payer: Self-pay | Admitting: Nurse Practitioner

## 2019-03-17 DIAGNOSIS — Z7901 Long term (current) use of anticoagulants: Secondary | ICD-10-CM

## 2019-04-15 ENCOUNTER — Telehealth: Payer: Self-pay | Admitting: Nurse Practitioner

## 2019-04-15 NOTE — Telephone Encounter (Signed)
Contacted Erie Noe and advised of INR on 3/2. She requests that lab and labs from 11/3 be faxed to [phone] She also reports pt is currently still taking coumadin.  Faxed labs

## 2019-04-15 NOTE — Telephone Encounter (Signed)
    Erie Noe from [facility] requesting last INR/ labs. Patient is having a procedure on 04/13  Phone [phone] Fax [phone]

## 2019-04-16 ENCOUNTER — Ambulatory Visit: Payer: PRIVATE HEALTH INSURANCE | Admitting: Internal Medicine

## 2019-04-24 LAB — HM PAP SMEAR

## 2019-04-24 LAB — RESULTS CONSOLE HPV: CHL HPV: NEGATIVE

## 2019-04-30 ENCOUNTER — Ambulatory Visit: Payer: PRIVATE HEALTH INSURANCE

## 2019-05-01 ENCOUNTER — Ambulatory Visit: Payer: PRIVATE HEALTH INSURANCE | Admitting: Internal Medicine

## 2019-05-07 ENCOUNTER — Ambulatory Visit (INDEPENDENT_AMBULATORY_CARE_PROVIDER_SITE_OTHER): Payer: PRIVATE HEALTH INSURANCE | Admitting: General Practice

## 2019-05-07 ENCOUNTER — Other Ambulatory Visit: Payer: Self-pay

## 2019-05-07 DIAGNOSIS — D6859 Other primary thrombophilia: Secondary | ICD-10-CM

## 2019-05-07 LAB — POCT INR: INR: 2.5 (ref 2.0–3.0)

## 2019-05-07 NOTE — Patient Instructions (Addendum)
Pre visit review using our clinic review tool, if applicable. No additional management support is needed unless otherwise documented below in the visit note.  Continue to take 1 tablet daily except 1/2 on Wednesdays and Saturdays.  Re-check in 6 to 8 weeks per patient request.    

## 2019-05-07 NOTE — Progress Notes (Signed)
Medical screening examination/treatment/procedure(s) were performed by non-physician practitioner and as supervising physician I was immediately available for consultation/collaboration. I agree with above. Adal Sereno, MD   

## 2019-05-23 ENCOUNTER — Telehealth: Payer: Self-pay | Admitting: Nurse Practitioner

## 2019-05-23 NOTE — Telephone Encounter (Signed)
Notified Pt shes scheduled for tomorrow 05/24/19 at 8:30am.

## 2019-05-23 NOTE — Telephone Encounter (Signed)
Please advise to schedule video appt for tomorrow. Thank you

## 2019-05-23 NOTE — Telephone Encounter (Signed)
Patient states she has poison ivy for 2 weeks and it is not getting better. She would like Charlotte to call in steroids to the pharmacy. I offered a video visit but she feels she cannot wait until the next available appointment time which is tomorrow.

## 2019-05-23 NOTE — Telephone Encounter (Signed)
Charlotte please advise.   Pt has has poison ivy for 2 weeks and is not getting any better. She was offered a video visit for tomorrow but stating she can't wait and asking for some sort of steroid to be called in.

## 2019-05-24 ENCOUNTER — Telehealth (INDEPENDENT_AMBULATORY_CARE_PROVIDER_SITE_OTHER): Payer: PRIVATE HEALTH INSURANCE | Admitting: Nurse Practitioner

## 2019-05-24 ENCOUNTER — Encounter: Payer: Self-pay | Admitting: Nurse Practitioner

## 2019-05-24 VITALS — Ht 68.0 in | Wt 163.0 lb

## 2019-05-24 DIAGNOSIS — L247 Irritant contact dermatitis due to plants, except food: Secondary | ICD-10-CM

## 2019-05-24 MED ORDER — PREDNISONE 10 MG (21) PO TBPK: ORAL_TABLET | ORAL | 0 refills | Status: DC

## 2019-05-24 MED ORDER — HYDROCORTISONE 2.5 % EX CREA: TOPICAL_CREAM | Freq: Two times a day (BID) | CUTANEOUS | 0 refills | Status: DC

## 2019-05-24 NOTE — Patient Instructions (Signed)
Take prednisone with food. Provided a shorter course to minimaize risk of GI bleed. Call office if you develop an blood in stool or black stool or nausea or ABD pain or perfuse nosebleed. Ok to alternate between hydrocortisone cream and calamine lotion to soothe rash. Call office if no improvement in 1week  Poison Ivy Dermatitis Poison ivy dermatitis is redness and soreness of the skin caused by chemicals in the leaves of the poison ivy plant. You may have very bad itching, swelling, a rash, and blisters. What are the causes?  Touching a poison ivy plant.  Touching something that has the chemical on it. This may include animals or objects that have come in contact with the plant. What increases the risk?  Going outdoors often in wooded or [place] areas.  Going outdoors without wearing protective clothing, such as closed shoes, long pants, and a long-sleeved shirt. What are the signs or symptoms?   Skin redness.  Very bad itching.  A rash that often includes bumps and blisters. ? The rash usually appears 48 hours after exposure, if you have been exposed before. ? If this is the first time you have been exposed, the rash may not appear until a week after exposure.  Swelling. This may occur if the reaction is very bad. Symptoms usually last for 1-2 weeks. The first time you develop this condition, symptoms may last 3-4 weeks. How is this treated? This condition may be treated with:  Hydrocortisone cream or calamine lotion to relieve itching.  Oatmeal baths to soothe the skin.  Medicines, such as over-the-counter antihistamine tablets.  Oral steroid medicine for more severe reactions. Follow these instructions at home: Medicines  Take or apply over-the-counter and prescription medicines only as told by your doctor.  Use hydrocortisone cream or calamine lotion as needed to help with itching. General instructions  Do not scratch or rub your skin.  Put a cold, wet cloth  (cold compress) on the affected areas or take baths in cool water. This will help with itching.  Avoid hot baths and showers.  Take oatmeal baths as needed. Use colloidal oatmeal. You can get this at a pharmacy or grocery store. Follow the instructions on the package.  While you have the rash, wash your clothes right after you wear them.  Keep all follow-up visits as told by your health care provider. This is important. How is this prevented?   Know what poison ivy looks like, so you can avoid it. ? This plant has three leaves with flowering branches on a single stem. ? The leaves are glossy. ? The leaves have uneven edges that come to a point at the front.  If you touch poison ivy, wash your skin with soap and water right away. Be sure to wash under your fingernails.  When hiking or camping, wear long pants, a long-sleeved shirt, tall socks, and hiking boots. You can also use a lotion on your skin that helps to prevent contact with poison ivy.  If you think that your clothes or outdoor gear came in contact with poison ivy, rinse them off with a garden hose before you bring them inside your house.  When doing yard work or gardening, wear gloves, long sleeves, long pants, and boots. Wash your garden tools and gloves if they come in contact with poison ivy.  If you think that your pet has come into contact with poison ivy, wash him or her with pet shampoo and water. Make sure to wear gloves while  washing your pet. Contact a doctor if:  You have open sores in the rash area.  You have more redness, swelling, or pain in the rash area.  You have redness that spreads beyond the rash area.  You have fluid, blood, or pus coming from the rash area.  You have a fever.  You have a rash over a large area of your body.  You have a rash on your eyes, mouth, or genitals.  Your rash does not get better after a few weeks. Get help right away if:  Your face swells or your eyes swell  shut.  You have trouble breathing.  You have trouble swallowing. These symptoms may be an emergency. Do not wait to see if the symptoms will go away. Get medical help right away. Call your local emergency services (911 in the [place] Do not drive yourself to the hospital. Summary  Poison ivy dermatitis is redness and soreness of the skin caused by chemicals in the leaves of the poison ivy plant.  You may have skin redness, very bad itching, swelling, and a rash.  Do not scratch or rub your skin.  Take or apply over-the-counter and prescription medicines only as told by your doctor. This information is not intended to replace advice given to you by your health care provider. Make sure you discuss any questions you have with your health care provider. Document Revised: 04/13/2018 Document Reviewed: 12/15/2017 Elsevier Patient Education  2020 [employer].

## 2019-05-24 NOTE — Progress Notes (Signed)
Virtual Visit via Video Note  I connected with@ on 05/24/19 at  8:30 AM EDT by a video enabled telemedicine application and verified that I am speaking with the correct person using two identifiers.  Location: Patient:Home Provider: Office Participants: patient and provider   I connected with "Patient Name" today by a video enabled telemedicine application and verified that I am speaking with the correct person using two identifiers. Location patient: home Location provider: work Persons participating in the virtual visit: patient, provider  I discussed the limitations of evaluation and management by telemedicine and the availability of in person appointments. I also discussed with the patient that there may be a patient responsible charge related to this service. The patient expressed understanding and agreed to proceed.  CC:pt c/o poison ivy all over x2 weeks she has tried itch creams, alovera, voltaren gel and calamine lotion  History of Present Illness: Poison Ivy This is a new problem. The current episode started 1 to 4 weeks ago. The problem has been gradually worsening since onset. The affected locations include the torso. The rash is characterized by blistering, redness and itchiness. She was exposed to plant contact. Pertinent negatives include no eye pain, fatigue, fever, joint pain, shortness of breath or sore throat. Past treatments include anti-itch cream. The treatment provided no relief. There is no history of allergies, asthma, eczema or varicella.  last INR of 2.5 Onset after yardwork  Observations/Objective: Physical Exam  Constitutional: She is oriented to person, place, and time. No distress.  Pulmonary/Chest: Effort normal.  Neurological: She is alert and oriented to person, place, and time.  Skin: Rash noted. Rash is maculopapular. There is erythema.      Assessment and Plan: Serinity was seen today for virtual and poison ivy.  Diagnoses and all orders for  this visit:  Irritant contact dermatitis due to plants, except food -     predniSONE (STERAPRED UNI-PAK 21 TAB) 10 MG (21) TBPK tablet; As directed on package, with food -     hydrocortisone 2.5 % cream; Apply topically 2 (two) times daily.   Follow Up Instructions: Take prednisone with food. Provided a shorter course to minimize risk of GI bleed due to current use of warfarin. Call office if you develop an blood in stool or black stool or nausea or ABD pain or perfuse nosebleed. Ok to alternate between hydrocortisone cream and calamine lotion to soothe rash. Call office if no improvement in 1week   I discussed the assessment and treatment plan with the patient. The patient was provided an opportunity to ask questions and all were answered. The patient agreed with the plan and demonstrated an understanding of the instructions.   The patient was advised to call back or seek an in-person evaluation if the symptoms worsen or if the condition fails to improve as anticipated.  Alysia Penna, NP

## 2019-06-07 ENCOUNTER — Telehealth: Payer: Self-pay | Admitting: Nurse Practitioner

## 2019-06-07 DIAGNOSIS — L247 Irritant contact dermatitis due to plants, except food: Secondary | ICD-10-CM

## 2019-06-07 MED ORDER — PREDNISONE 10 MG (21) PO TBPK: ORAL_TABLET | ORAL | 0 refills | Status: DC

## 2019-06-07 NOTE — Telephone Encounter (Signed)
Melinda Benton is calling and wanted to see if thy could get an alternate medication for prednisone the non pack version with new instructions. CB is [phone]

## 2019-06-07 NOTE — Telephone Encounter (Signed)
Another prednisone rx sent

## 2019-06-07 NOTE — Telephone Encounter (Signed)
Pt notified and understood to pick up Rx.

## 2019-06-07 NOTE — Telephone Encounter (Signed)
Charlotte please advise. Pt was seen for Poison ivy on 05/24/19 via video, pt stating she completed the medications and the rash is coming back an is spreading. Pt asking if something can be sent into pharmacy for her.

## 2019-06-07 NOTE — Telephone Encounter (Signed)
Patient stated that she was seen for poison ivy and she has completed the medication and now it has came back. Patient stated that the rash is spreading and wanted to see if something can be sent to [facility] [address]. Pls advise. CB is [phone]

## 2019-06-13 NOTE — Telephone Encounter (Signed)
Call pharmacy or patient to inquire of rx was already picked up. This request came in 4days ago.

## 2019-06-13 NOTE — Telephone Encounter (Signed)
Melinda Benton was contacted and RX was picked up they switched from the blister pack to individual an stated it was taken care of.

## 2019-06-26 ENCOUNTER — Other Ambulatory Visit: Payer: Self-pay | Admitting: Endocrinology

## 2019-06-26 DIAGNOSIS — E069 Thyroiditis, unspecified: Secondary | ICD-10-CM

## 2019-07-02 ENCOUNTER — Ambulatory Visit: Payer: PRIVATE HEALTH INSURANCE

## 2019-07-06 ENCOUNTER — Other Ambulatory Visit: Payer: Self-pay

## 2019-07-06 ENCOUNTER — Encounter (HOSPITAL_COMMUNITY): Payer: Self-pay | Admitting: Emergency Medicine

## 2019-07-06 ENCOUNTER — Emergency Department (HOSPITAL_COMMUNITY): Payer: PRIVATE HEALTH INSURANCE

## 2019-07-06 ENCOUNTER — Inpatient Hospital Stay (HOSPITAL_COMMUNITY)
Admission: EM | Admit: 2019-07-06 | Discharge: 2019-07-08 | DRG: 418 | Disposition: A | Discharge status: Home / Self Care | Payer: PRIVATE HEALTH INSURANCE | Attending: Family Medicine | Admitting: Family Medicine

## 2019-07-06 DIAGNOSIS — Z79890 Hormone replacement therapy (postmenopausal): Secondary | ICD-10-CM | POA: Diagnosis not present

## 2019-07-06 DIAGNOSIS — Z96652 Presence of left artificial knee joint: Secondary | ICD-10-CM | POA: Diagnosis present

## 2019-07-06 DIAGNOSIS — K219 Gastro-esophageal reflux disease without esophagitis: Secondary | ICD-10-CM | POA: Diagnosis present

## 2019-07-06 DIAGNOSIS — E039 Hypothyroidism, unspecified: Secondary | ICD-10-CM | POA: Diagnosis present

## 2019-07-06 DIAGNOSIS — Z7901 Long term (current) use of anticoagulants: Secondary | ICD-10-CM

## 2019-07-06 DIAGNOSIS — Z832 Family history of diseases of the blood and blood-forming organs and certain disorders involving the immune mechanism: Secondary | ICD-10-CM | POA: Diagnosis not present

## 2019-07-06 DIAGNOSIS — Z86711 Personal history of pulmonary embolism: Secondary | ICD-10-CM

## 2019-07-06 DIAGNOSIS — D6859 Other primary thrombophilia: Secondary | ICD-10-CM | POA: Diagnosis present

## 2019-07-06 DIAGNOSIS — D689 Coagulation defect, unspecified: Secondary | ICD-10-CM | POA: Diagnosis present

## 2019-07-06 DIAGNOSIS — K810 Acute cholecystitis: Secondary | ICD-10-CM | POA: Diagnosis present

## 2019-07-06 DIAGNOSIS — Z20822 Contact with and (suspected) exposure to covid-19: Secondary | ICD-10-CM | POA: Diagnosis present

## 2019-07-06 DIAGNOSIS — Z79899 Other long term (current) drug therapy: Secondary | ICD-10-CM

## 2019-07-06 DIAGNOSIS — R1011 Right upper quadrant pain: Secondary | ICD-10-CM

## 2019-07-06 DIAGNOSIS — K8062 Calculus of gallbladder and bile duct with acute cholecystitis without obstruction: Secondary | ICD-10-CM | POA: Diagnosis present

## 2019-07-06 DIAGNOSIS — K8020 Calculus of gallbladder without cholecystitis without obstruction: Secondary | ICD-10-CM

## 2019-07-06 LAB — CBC
HCT: 42.5 % (ref 36.0–46.0)
Hemoglobin: 14.1 g/dL (ref 12.0–15.0)
MCH: 30.5 pg (ref 26.0–34.0)
MCHC: 33.2 g/dL (ref 30.0–36.0)
MCV: 91.8 fL (ref 80.0–100.0)
Platelets: 192 10*3/uL (ref 150–400)
RBC: 4.63 MIL/uL (ref 3.87–5.11)
RDW: 11.9 % (ref 11.5–15.5)
WBC: 12.9 10*3/uL — ABNORMAL HIGH (ref 4.0–10.5)
nRBC: 0 % (ref 0.0–0.2)

## 2019-07-06 LAB — COMPREHENSIVE METABOLIC PANEL
ALT: 17 U/L (ref 0–44)
AST: 19 U/L (ref 15–41)
Albumin: 4.4 g/dL (ref 3.5–5.0)
Alkaline Phosphatase: 57 U/L (ref 38–126)
Anion gap: 10 (ref 5–15)
BUN: 11 mg/dL (ref 6–20)
CO2: 28 mmol/L (ref 22–32)
Calcium: 9 mg/dL (ref 8.9–10.3)
Chloride: 100 mmol/L (ref 98–111)
Creatinine, Ser: 0.86 mg/dL (ref 0.44–1.00)
GFR calc Af Amer: >60 mL/min (ref >60)
GFR calc non Af Amer: >60 mL/min (ref >60)
Glucose, Bld: 111 mg/dL — ABNORMAL HIGH (ref 70–99)
Potassium: 3.6 mmol/L (ref 3.5–5.1)
Sodium: 138 mmol/L (ref 135–145)
Total Bilirubin: 1.1 mg/dL (ref 0.3–1.2)
Total Protein: 7.6 g/dL (ref 6.5–8.1)

## 2019-07-06 LAB — SARS CORONAVIRUS 2 BY RT PCR (HOSPITAL ORDER, PERFORMED IN ~~LOC~~ HOSPITAL LAB): SARS Coronavirus 2: NEGATIVE

## 2019-07-06 LAB — LIPASE, BLOOD: Lipase: 80 U/L — ABNORMAL HIGH (ref 11–51)

## 2019-07-06 LAB — PROTIME-INR
INR: 2.4 — ABNORMAL HIGH (ref 0.8–1.2)
Prothrombin Time: 25.2 seconds — ABNORMAL HIGH (ref 11.4–15.2)

## 2019-07-06 LAB — I-STAT BETA HCG BLOOD, ED (MC, WL, AP ONLY): I-stat hCG, quantitative: 5 m[IU]/mL — ABNORMAL HIGH (ref <5)

## 2019-07-06 LAB — APTT: aPTT: 55 seconds — ABNORMAL HIGH (ref 24–36)

## 2019-07-06 MED ORDER — SODIUM CHLORIDE 0.9 % IV SOLN
2.0000 g | Freq: Once | INTRAVENOUS | Status: AC
  Administered 2019-07-06: 2 g via INTRAVENOUS
  Filled 2019-07-06: qty 20

## 2019-07-06 MED ORDER — LACTATED RINGERS IV SOLN: INTRAVENOUS | Status: AC

## 2019-07-06 MED ORDER — LEVOTHYROXINE SODIUM 100 MCG/5ML IV SOLN
44.0000 ug | Freq: Every day | INTRAVENOUS | Status: DC
  Filled 2019-07-06: qty 5

## 2019-07-06 MED ORDER — SODIUM CHLORIDE 0.9 % IV BOLUS
1000.0000 mL | Freq: Once | INTRAVENOUS | Status: AC
  Administered 2019-07-06: 1000 mL via INTRAVENOUS

## 2019-07-06 MED ORDER — ONDANSETRON HCL 4 MG/2ML IJ SOLN: 4.0000 mg | Freq: Four times a day (QID) | INTRAMUSCULAR | Status: DC | PRN

## 2019-07-06 MED ORDER — ONDANSETRON HCL 4 MG/2ML IJ SOLN
4.0000 mg | Freq: Once | INTRAMUSCULAR | Status: AC
  Administered 2019-07-06: 4 mg via INTRAVENOUS
  Filled 2019-07-06: qty 2

## 2019-07-06 MED ORDER — MORPHINE SULFATE (PF) 4 MG/ML IV SOLN: 4.0000 mg | INTRAVENOUS | Status: DC | PRN

## 2019-07-06 MED ORDER — MORPHINE SULFATE (PF) 4 MG/ML IV SOLN
8.0000 mg | Freq: Once | INTRAVENOUS | Status: AC
  Administered 2019-07-06: 8 mg via INTRAVENOUS
  Filled 2019-07-06: qty 2

## 2019-07-06 MED ORDER — METRONIDAZOLE IN NACL 5-0.79 MG/ML-% IV SOLN
500.0000 mg | Freq: Three times a day (TID) | INTRAVENOUS | Status: DC
  Administered 2019-07-07 – 2019-07-08 (×4): 500 mg via INTRAVENOUS
  Filled 2019-07-06 (×4): qty 100

## 2019-07-06 MED ORDER — MORPHINE SULFATE (PF) 2 MG/ML IV SOLN
2.0000 mg | INTRAVENOUS | Status: AC | PRN
  Administered 2019-07-06 – 2019-07-07 (×4): 2 mg via INTRAVENOUS
  Filled 2019-07-06 (×4): qty 1

## 2019-07-06 MED ORDER — SODIUM CHLORIDE 0.9 % IV SOLN
2.0000 g | INTRAVENOUS | Status: DC
  Administered 2019-07-07: 2 g via INTRAVENOUS
  Filled 2019-07-06: qty 20
  Filled 2019-07-06: qty 2

## 2019-07-06 MED ORDER — VITAMIN K1 10 MG/ML IJ SOLN
5.0000 mg | Freq: Once | INTRAVENOUS | Status: AC
  Administered 2019-07-07: 5 mg via INTRAVENOUS
  Filled 2019-07-06: qty 0.5

## 2019-07-06 MED ORDER — MORPHINE SULFATE (PF) 2 MG/ML IV SOLN: 1.0000 mg | INTRAVENOUS | Status: DC | PRN

## 2019-07-06 MED ORDER — SODIUM CHLORIDE 0.9% FLUSH: 3.0000 mL | Freq: Once | INTRAVENOUS | Status: DC

## 2019-07-06 MED ORDER — ACETAMINOPHEN 325 MG PO TABS: 650.0000 mg | ORAL_TABLET | Freq: Four times a day (QID) | ORAL | Status: DC | PRN

## 2019-07-06 MED ORDER — ACETAMINOPHEN 650 MG RE SUPP: 650.0000 mg | Freq: Four times a day (QID) | RECTAL | Status: DC | PRN

## 2019-07-06 NOTE — ED Notes (Signed)
Disregard last set of vitals

## 2019-07-06 NOTE — Consult Note (Addendum)
CC: abd pain  Requesting provider: Dr Janey Genta  HPI: Melinda Benton is an 59 y.o. female who is here for RUQ pain.  She reports R sided abd pain for the past 2 days.  Associated with nausea and vomiting.    Past Medical History:  Diagnosis Date  . Arthritis   . Clotting disorder (HCC)   . H/O blood clots   . History of chicken pox   . Migraines   . Mitral valve prolapse   . Thyroid disease     Past Surgical History:  Procedure Laterality Date  . bladder sur    . CESAREAN SECTION     2  . ELBOW SURGERY  2009  . knee repla Left 07/2016   partial knee replacement   . ROTATOR CUFF REPAIR  2011  . TONSILLECTOMY  ?1968    Family History  Problem Relation Age of Onset  . Protein S deficiency Mother   . Clotting disorder Mother   . Heart disease Father   . Diabetes Father   . Headache Father   . Colon cancer Paternal Grandfather   . Stroke Brother   . Diabetes Paternal Aunt   . Diabetes Paternal Uncle   . Colon cancer Paternal Grandmother 41  . Colon cancer Cousin 37       paternal    Social:  reports that she has never smoked. She has never used smokeless tobacco. She reports current alcohol use. She reports that she does not use drugs.  Allergies: No Known Allergies  Medications: I have reviewed the patient's current medications.  Results for orders placed or performed during the hospital encounter of 07/06/19 (from the past 48 hour(s))  Lipase, blood     Status: Abnormal   Collection Time: 07/06/19  5:34 PM  Result Value Ref Range   Lipase 80 (H) 11 - 51 U/L    Comment: Performed at [facility], [address] [place], [place] [address]  Comprehensive metabolic panel     Status: Abnormal   Collection Time: 07/06/19  5:34 PM  Result Value Ref Range   Sodium 138 135 - 145 mmol/L   Potassium 3.6 3.5 - 5.1 mmol/L   Chloride 100 98 - 111 mmol/L   CO2 28 22 - 32 mmol/L   Glucose, Bld 111 (H) 70 - 99 mg/dL    Comment: Glucose reference  range applies only to samples taken after fasting for at least 8 hours.   BUN 11 6 - 20 mg/dL   Creatinine, Ser 5.99 0.44 - 1.00 mg/dL   Calcium 9.0 8.9 - 35.7 mg/dL   Total Protein 7.6 6.5 - 8.1 g/dL   Albumin 4.4 3.5 - 5.0 g/dL   AST 19 15 - 41 U/L   ALT 17 0 - 44 U/L   Alkaline Phosphatase 57 38 - 126 U/L   Total Bilirubin 1.1 0.3 - 1.2 mg/dL   GFR calc non Af Amer >60 >60 mL/min   GFR calc Af Amer >60 >60 mL/min   Anion gap 10 5 - 15    Comment: Performed at [facility], [address], [place], [place] [address]  CBC     Status: Abnormal   Collection Time: 07/06/19  5:34 PM  Result Value Ref Range   WBC 12.9 (H) 4.0 - 10.5 K/uL   RBC 4.63 3.87 - 5.11 MIL/uL   Hemoglobin 14.1 12.0 - 15.0 g/dL   HCT 39.0 36 - 46 %   MCV  91.8 80.0 - 100.0 fL   MCH 30.5 26.0 - 34.0 pg   MCHC 33.2 30.0 - 36.0 g/dL   RDW 31.5 40.0 - 86.7 %   Platelets 192 150 - 400 K/uL   nRBC 0.0 0.0 - 0.2 %    Comment: Performed at [facility], [address] [place], [place] [address]  I-Stat beta hCG blood, ED     Status: Abnormal   Collection Time: 07/06/19  6:26 PM  Result Value Ref Range   I-stat hCG, quantitative 5.0 (H) <5 mIU/mL   Comment 3            Comment:   GEST. AGE      CONC.  (mIU/mL)   <=1 WEEK        5 - 50     2 WEEKS       50 - 500     3 WEEKS       100 - 10,000     4 WEEKS     1,000 - 30,000        FEMALE AND NON-PREGNANT FEMALE:     LESS THAN 5 mIU/mL   Protime-INR     Status: Abnormal   Collection Time: 07/06/19  6:32 PM  Result Value Ref Range   Prothrombin Time 25.2 (H) 11.4 - 15.2 seconds   INR 2.4 (H) 0.8 - 1.2    Comment: (NOTE) INR goal varies based on device and disease states. Performed at [facility], [address] [place], [place] [address]   APTT     Status: Abnormal   Collection Time: 07/06/19  6:32 PM  Result Value Ref Range   aPTT 55 (H) 24 - 36 seconds    Comment:        IF BASELINE aPTT IS  ELEVATED, SUGGEST PATIENT RISK ASSESSMENT BE USED TO DETERMINE APPROPRIATE ANTICOAGULANT THERAPY. Performed at [facility], [address] [place], [place] [address]     US Abdomen Limited RUQ  Result Date: 07/06/2019 CLINICAL DATA:  Right upper quadrant pain EXAM: ULTRASOUND ABDOMEN LIMITED RIGHT UPPER QUADRANT COMPARISON:  Ultrasound 07/29/2016, CTa chest 01/11/2017 FINDINGS: Gallbladder: Mild gallbladder distension with the gallbladder measuring up to 11 cm in length. Gallbladder contains some echogenic biliary sludge layering dependently. No visible calcified gallstones. Previously seen gallbladder polyps are not visualized on this exam. Borderline gallbladder wall thickening to 3.3 mm. Sonographic Eulah Pont sign is reportedly negative however patient was pre-medicated prior to this examination. Common bile duct: Diameter: 7.2 mm, borderline dilated. Liver: No focal lesion identified. Within normal limits in parenchymal echogenicity. Previously seen hepatic cyst is not visualized on this exam. Portal vein is patent on color Doppler imaging with normal direction of blood flow towards the liver. Other: None. IMPRESSION: Small amount of biliary sludge with mild gallbladder wall thickening and distention. Patient was pre-medicated prior to the examination limiting clinical utility of a sonographic Murphy sign. Findings could reflect acute cholecystitis in the appropriate clinical setting. Borderline dilatation of the common bile duct at 7.2 mm. Recommend correlation with liver serologies. If there is persisting concern for a choledocholithiasis, MRCP could be obtained. Electronically Signed   By: Kreg Shropshire M.D.   On: 07/06/2019 19:36    ROS - all of the below systems have been reviewed with the patient and positives are indicated with bold text General: chills, fever or night sweats Eyes: blurry vision or double vision ENT: epistaxis or sore throat Allergy/Immunology:  itchy/watery  eyes or nasal congestion Hematologic/Lymphatic: bleeding problems, blood clots or swollen lymph nodes Endocrine: temperature intolerance or unexpected weight changes Breast: new or changing breast lumps or nipple discharge Resp: cough, shortness of breath, or wheezing CV: chest pain or dyspnea on exertion GI: as per HPI GU: dysuria, trouble voiding, or hematuria MSK: joint pain or joint stiffness Neuro: TIA or stroke symptoms Derm: pruritus and skin lesion changes Psych: anxiety and depression  PE Blood pressure 123/60, pulse 85, temperature 98 F (36.7 C), temperature source Oral, resp. rate 15, SpO2 96 %. Constitutional: NAD; conversant; no deformities Eyes: Moist conjunctiva; no lid lag; anicteric; PERRL Neck: Trachea midline; no thyromegaly Lungs: Normal respiratory effort; no tactile fremitus CV: RRR; no palpable thrills; no pitting edema GI: Abd TTP RUQ; no palpable hepatosplenomegaly MSK: Normal range of motion of extremities; no clubbing/cyanosis Psychiatric: Appropriate affect; alert and oriented x3 Lymphatic: No palpable cervical or axillary lymphadenopathy  Results for orders placed or performed during the hospital encounter of 07/06/19 (from the past 48 hour(s))  Lipase, blood     Status: Abnormal   Collection Time: 07/06/19  5:34 PM  Result Value Ref Range   Lipase 80 (H) 11 - 51 U/L    Comment: Performed at [facility], [address] [place], [place] [address]  Comprehensive metabolic panel     Status: Abnormal   Collection Time: 07/06/19  5:34 PM  Result Value Ref Range   Sodium 138 135 - 145 mmol/L   Potassium 3.6 3.5 - 5.1 mmol/L   Chloride 100 98 - 111 mmol/L   CO2 28 22 - 32 mmol/L   Glucose, Bld 111 (H) 70 - 99 mg/dL    Comment: Glucose reference range applies only to samples taken after fasting for at least 8 hours.   BUN 11 6 - 20 mg/dL   Creatinine, Ser 6.040.86 0.44 - 1.00 mg/dL   Calcium 9.0 8.9 - 54.010.3 mg/dL   Total  Protein 7.6 6.5 - 8.1 g/dL   Albumin 4.4 3.5 - 5.0 g/dL   AST 19 15 - 41 U/L   ALT 17 0 - 44 U/L   Alkaline Phosphatase 57 38 - 126 U/L   Total Bilirubin 1.1 0.3 - 1.2 mg/dL   GFR calc non Af Amer >60 >60 mL/min   GFR calc Af Amer >60 >60 mL/min   Anion gap 10 5 - 15    Comment: Performed at [facility], [address] [place], [place] [address]  CBC     Status: Abnormal   Collection Time: 07/06/19  5:34 PM  Result Value Ref Range   WBC 12.9 (H) 4.0 - 10.5 K/uL   RBC 4.63 3.87 - 5.11 MIL/uL   Hemoglobin 14.1 12.0 - 15.0 g/dL   HCT 14.742.5 36 - 46 %   MCV 91.8 80.0 - 100.0 fL   MCH 30.5 26.0 - 34.0 pg   MCHC 33.2 30.0 - 36.0 g/dL   RDW 82.911.9 56.211.5 - 13.015.5 %   Platelets 192 150 - 400 K/uL   nRBC 0.0 0.0 - 0.2 %    Comment: Performed at [facility], [address] [place], [place] [address]  I-Stat beta hCG blood, ED     Status: Abnormal   Collection Time: 07/06/19  6:26 PM  Result Value Ref Range   I-stat hCG, quantitative 5.0 (H) <5 mIU/mL   Comment 3            Comment:  GEST. AGE      CONC.  (mIU/mL)   <=1 WEEK        5 - 50     2 WEEKS       50 - 500     3 WEEKS       100 - 10,000     4 WEEKS     1,000 - 30,000        FEMALE AND NON-PREGNANT FEMALE:     LESS THAN 5 mIU/mL   Protime-INR     Status: Abnormal   Collection Time: 07/06/19  6:32 PM  Result Value Ref Range   Prothrombin Time 25.2 (H) 11.4 - 15.2 seconds   INR 2.4 (H) 0.8 - 1.2    Comment: (NOTE) INR goal varies based on device and disease states. Performed at [facility], [address] [place], [place] [address]   APTT     Status: Abnormal   Collection Time: 07/06/19  6:32 PM  Result Value Ref Range   aPTT 55 (H) 24 - 36 seconds    Comment:        IF BASELINE aPTT IS ELEVATED, SUGGEST PATIENT RISK ASSESSMENT BE USED TO DETERMINE APPROPRIATE ANTICOAGULANT THERAPY. Performed at [facility], [address] [place],  [place] [address]     US Abdomen Limited RUQ  Result Date: 07/06/2019 CLINICAL DATA:  Right upper quadrant pain EXAM: ULTRASOUND ABDOMEN LIMITED RIGHT UPPER QUADRANT COMPARISON:  Ultrasound 07/29/2016, CTa chest 01/11/2017 FINDINGS: Gallbladder: Mild gallbladder distension with the gallbladder measuring up to 11 cm in length. Gallbladder contains some echogenic biliary sludge layering dependently. No visible calcified gallstones. Previously seen gallbladder polyps are not visualized on this exam. Borderline gallbladder wall thickening to 3.3 mm. Sonographic Eulah Pont sign is reportedly negative however patient was pre-medicated prior to this examination. Common bile duct: Diameter: 7.2 mm, borderline dilated. Liver: No focal lesion identified. Within normal limits in parenchymal echogenicity. Previously seen hepatic cyst is not visualized on this exam. Portal vein is patent on color Doppler imaging with normal direction of blood flow towards the liver. Other: None. IMPRESSION: Small amount of biliary sludge with mild gallbladder wall thickening and distention. Patient was pre-medicated prior to the examination limiting clinical utility of a sonographic Murphy sign. Findings could reflect acute cholecystitis in the appropriate clinical setting. Borderline dilatation of the common bile duct at 7.2 mm. Recommend correlation with liver serologies. If there is persisting concern for a choledocholithiasis, MRCP could be obtained. Electronically Signed   By: Kreg Shropshire M.D.   On: 07/06/2019 19:36     A/P: Melinda Benton is an 59 y.o. female with cholelithiasis and possible choledocholithiasis.  WBC mildly elevated.  Would recommend lap chole and IOC as long as lipase is down in AM and INR can be safely reversed overnight (<1.5).  NPO.  IV abx.  Repeat labs in AM (CBC, CMET, INR, Lipase).   Vanita Panda, MD  Colorectal and General Surgery [facility]

## 2019-07-06 NOTE — H&P (Signed)
History and Physical    JAISHA VILLACRES TIR:[id number] DOB: 06/12/1960 DOA: 07/06/2019  PCP: Anne Ng, NP Patient coming from: Home  Chief Complaint: Abdominal pain  HPI: Melinda Benton is a 59 y.o. 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  Patient reports 2-day history of severe right upper quadrant abdominal pain, nausea, and vomiting.  States she was out of town at [employer] and went to the local hospital there.  States they did an ultrasound and told her her gallbladder was abnormal and recommended surgery to remove it.  She did not want the surgery done at that hospital and returned to [facility] today.  Reports having low-grade fevers.  Denies cough or shortness of breath.  She has been vaccinated for Covid.  ED Course: Afebrile and hemodynamically stable.  WBC count 12.9.  Lipase 80, LFTs normal.  INR 2.4.  Screening SARS-CoV-2 PCR test pending.  Right upper quadrant ultrasound showing small amount of biliary sludge with mild gallbladder wall thickening and distention. Borderline dilatation of the common bile duct at 7.2 mm.  General surgery consulted.  Plan is to do lap chole and IOC after lipase is down in the morning and INR is reversed (<1.5).  Patient was given morphine, Zofran, ceftriaxone, and 1 L fluid bolus.  Review of Systems:  All systems reviewed and apart from history of presenting illness, are negative.  Past Medical History:  Diagnosis Date  . Arthritis   . Clotting disorder (HCC)   . H/O blood clots   . History of chicken pox   . Migraines   . Mitral valve prolapse   . Thyroid disease     Past Surgical History:  Procedure Laterality Date  . bladder sur    . CESAREAN SECTION     2  . ELBOW SURGERY  2009  . knee repla Left 07/2016   partial knee replacement   . ROTATOR CUFF REPAIR  2011  .  TONSILLECTOMY  ?1968     reports that she has never smoked. She has never used smokeless tobacco. She reports current alcohol use. She reports that she does not use drugs.  No Known Allergies  Family History  Problem Relation Age of Onset  . Protein S deficiency Mother   . Clotting disorder Mother   . Heart disease Father   . Diabetes Father   . Headache Father   . Colon cancer Paternal Grandfather   . Stroke Brother   . Diabetes Paternal Aunt   . Diabetes Paternal Uncle   . Colon cancer Paternal Grandmother 58  . Colon cancer Cousin 35       paternal    Prior to Admission medications   Medication Sig Start Date End Date Taking? Authorizing Provider  buPROPion (WELLBUTRIN XL) 300 MG 24 hr tablet Take 1 tablet (300 mg total) by mouth daily. Need Office Visit for additional refills 11/12/17  Yes Nche, Bonna Gains, NP  hydrocortisone 2.5 % cream Apply topically 2 (two) times daily. Patient taking differently: Apply 1 application topically as needed (poison ivy).  05/24/19  Yes Nche, Bonna Gains, NP  levothyroxine (SYNTHROID, LEVOTHROID) 88 MCG tablet Take 88 mcg by mouth daily before breakfast.   Yes [provider]  metoprolol succinate (TOPROL-XL) 25 MG 24 hr tablet Take 1 tablet (25 mg total) by mouth daily as needed. Patient taking differently: Take 25 mg by mouth  as needed (heart).  11/05/18  Yes Nche, Bonna Gains, NP  pantoprazole (PROTONIX) 20 MG tablet Take 1 tablet (20 mg total) by mouth daily. Patient taking differently: Take 20 mg by mouth as needed for heartburn (acid reflux).  11/05/18  Yes Nche, Bonna Gains, NP  warfarin (COUMADIN) 6 MG tablet TAKE AS DIRECTED BY ANTICOAGULATION CLINIC Patient taking differently: Take 6 mg by mouth daily. TAKE AS DIRECTED BY ANTICOAGULATION CLINIC. 03/17/19  Yes Nche, Bonna Gains, NP  calamine lotion Apply 1 application topically as needed for itching. Patient not taking: Reported on 07/06/2019 04/03/18   Nche, Bonna Gains, NP  Melatonin 5 MG TABS Take 1-2 tablets (5-10 mg total) by mouth at bedtime as needed. Patient not taking: Reported on 07/06/2019 11/05/18   Anne Ng, NP    Physical Exam: Vitals:   07/06/19 1730 07/06/19 1739 07/06/19 2000  BP: (!) 146/89 90/75 123/60  Pulse: 89 71 85  Resp: 19 19 15   Temp: 98.6 F (37 C) 98 F (36.7 C)   TempSrc: Oral Oral   SpO2: 100% 95% 96%    Physical Exam Constitutional:      General: She is not in acute distress. HENT:     Head: Normocephalic and atraumatic.  Eyes:     General: No scleral icterus.    Pupils: Pupils are equal, round, and reactive to light.  Cardiovascular:     Rate and Rhythm: Normal rate and regular rhythm.     Heart sounds: Normal heart sounds.  Pulmonary:     Effort: Pulmonary effort is normal. No respiratory distress.     Breath sounds: Normal breath sounds. No wheezing or rales.  Abdominal:     General: Bowel sounds are normal.     Palpations: Abdomen is soft.     Tenderness: There is abdominal tenderness in the right upper quadrant. There is guarding. There is no rebound. Positive signs include Murphy's sign.  Skin:    General: Skin is warm and dry.  Neurological:     General: No focal deficit present.     Mental Status: She is alert and oriented to person, place, and time.  Psychiatric:        Mood and Affect: Mood normal.        Behavior: Behavior normal.     Labs on Admission: I have personally reviewed following labs and imaging studies  CBC: Recent Labs  Lab 07/06/19 1734  WBC 12.9*  HGB 14.1  HCT 42.5  MCV 91.8  PLT 192   Basic Metabolic Panel: Recent Labs  Lab 07/06/19 1734  NA 138  K 3.6  CL 100  CO2 28  GLUCOSE 111*  BUN 11  CREATININE 0.86  CALCIUM 9.0   GFR: CrCl cannot be calculated (Unknown ideal weight.). Liver Function Tests: Recent Labs  Lab 07/06/19 1734  AST 19  ALT 17  ALKPHOS 57  BILITOT 1.1  PROT 7.6  ALBUMIN 4.4   Recent Labs  Lab 07/06/19 1734    LIPASE 80*   No results for input(s): AMMONIA in the last 168 hours. Coagulation Profile: Recent Labs  Lab 07/06/19 1832  INR 2.4*   Cardiac Enzymes: No results for input(s): CKTOTAL, CKMB, CKMBINDEX, TROPONINI in the last 168 hours. BNP (last 3 results) No results for input(s): PROBNP in the last 8760 hours. HbA1C: No results for input(s): HGBA1C in the last 72 hours. CBG: No results for input(s): GLUCAP in the last 168 hours. Lipid Profile: No results for input(s):  CHOL, HDL, LDLCALC, TRIG, CHOLHDL, LDLDIRECT in the last 72 hours. Thyroid Function Tests: No results for input(s): TSH, T4TOTAL, FREET4, T3FREE, THYROIDAB in the last 72 hours. Anemia Panel: No results for input(s): VITAMINB12, FOLATE, FERRITIN, TIBC, IRON, RETICCTPCT in the last 72 hours. Urine analysis: No results found for: COLORURINE, APPEARANCEUR, LABSPEC, PHURINE, GLUCOSEU, HGBUR, BILIRUBINUR, KETONESUR, PROTEINUR, UROBILINOGEN, NITRITE, LEUKOCYTESUR  Radiological Exams on Admission: US Abdomen Limited RUQ  Result Date: 07/06/2019 CLINICAL DATA:  Right upper quadrant pain EXAM: ULTRASOUND ABDOMEN LIMITED RIGHT UPPER QUADRANT COMPARISON:  Ultrasound 07/29/2016, CTa chest 01/11/2017 FINDINGS: Gallbladder: Mild gallbladder distension with the gallbladder measuring up to 11 cm in length. Gallbladder contains some echogenic biliary sludge layering dependently. No visible calcified gallstones. Previously seen gallbladder polyps are not visualized on this exam. Borderline gallbladder wall thickening to 3.3 mm. Sonographic Eulah PontMurphy sign is reportedly negative however patient was pre-medicated prior to this examination. Common bile duct: Diameter: 7.2 mm, borderline dilated. Liver: No focal lesion identified. Within normal limits in parenchymal echogenicity. Previously seen hepatic cyst is not visualized on this exam. Portal vein is patent on color Doppler imaging with normal direction of blood flow towards the liver. Other:  None. IMPRESSION: Small amount of biliary sludge with mild gallbladder wall thickening and distention. Patient was pre-medicated prior to the examination limiting clinical utility of a sonographic Murphy sign. Findings could reflect acute cholecystitis in the appropriate clinical setting. Borderline dilatation of the common bile duct at 7.2 mm. Recommend correlation with liver serologies. If there is persisting concern for a choledocholithiasis, MRCP could be obtained. Electronically Signed   By: Kreg ShropshirePrice  DeHay M.D.   On: 07/06/2019 19:36   Assessment/Plan Principal Problem:   Acute cholecystitis Active Problems:   Hypercoagulable state (HCC)   Hypothyroidism   Acute cholecystitis and possible choledocholithiasis: Patient is presenting with complaints of right upper quadrant abdominal pain, nausea, and vomiting.  Labs showing mild leukocytosis.  No fever or signs of sepsis.  Lipase 80, LFTs normal. Right upper quadrant ultrasound showing small amount of biliary sludge with mild gallbladder wall thickening and distention.   Positive Murphy sign on exam.  Borderline dilatation of the common bile duct at 7.2 mm.  She has been seen by general surgery and plan is  to do lap chole and IOC after lipase is down in the morning and INR is reversed (<1.5). -Keep n.p.o. -IV fluid hydration -Ceftriaxone and Flagyl -Zofran as needed for nausea/vomiting -Morphine as needed for pain -Repeat labs in a.m.: CBC, CMP, INR, lipase -General surgery following, appreciate recommendations  History of protein S deficiency: Patient is on Coumadin.  INR currently 2.4.  Needs reversal of INR to less than 1.5 in anticipation of surgery in the morning. -Hold Coumadin at this time and give IV vitamin K 5 mg once -Repeat INR in a.m.  Hypothyroidism -Continue Synthroid  HIV screening -The patient falls between the ages of 13-64 and should be screened for HIV, therefore HIV testing ordered.  DVT prophylaxis: SCDs Code  Status: Full code Family Communication: Husband at bedside. Disposition Plan: Status is: Inpatient  Remains inpatient appropriate because:IV treatments appropriate due to intensity of illness or inability to take PO, Inpatient level of care appropriate due to severity of illness and Needs cholecystectomy   Dispo: The patient is from: Home              Anticipated d/c is to: Home              Anticipated d/c date is:  3 days              Patient currently is not medically stable to d/c.  The medical decision making on this patient was of high complexity and the patient is at high risk for clinical deterioration, therefore this is a level 3 visit.  John Giovanni MD [facility]  If 7PM-7AM, please contact night-coverage [email or web address]  07/06/2019, 9:51 PM

## 2019-07-06 NOTE — ED Notes (Signed)
Lactated ringers solution paused until Rocephin is finished.

## 2019-07-06 NOTE — ED Provider Notes (Signed)
[facility]-EMERGENCY DEPT Provider Note   CSN: [id number] Arrival date & time: 07/06/19  1638     History Chief Complaint  Patient presents with  . Abdominal Pain  . Nausea    Melinda Benton is a 59 y.o. female.  HPI    59 year old female comes in a chief complaint of abdominal pain and vomiting.  She has history of mitral valve prolapse, protein S deficiency, on Coumadin.  She reports that she started having abdominal pain yesterday.  She went to an ER in [place] and had an ultrasound that confirmed that she had cholelithiasis.  They recommended surgery, however patient decided to come back to [facility] for her care.  She has had persistent abdominal pain, most prominent in the right upper quadrant and epigastric region.  The pain is radiating to the right flank region.  No history of similar pain in the past.  Patient denies any heavy alcohol use/stomach ulcers.   Past Medical History:  Diagnosis Date  . Arthritis   . Clotting disorder (HCC)   . H/O blood clots   . History of chicken pox   . Migraines   . Mitral valve prolapse   . Thyroid disease     Patient Active Problem List   Diagnosis Date Noted  . Osteopenia of femoral neck, bilateral 11/13/2018  . Osteopenia of lumbar spine 11/13/2018  . Family history of colon cancer 11/05/2018  . Hypothyroidism 11/05/2018  . Chronic left shoulder pain 03/06/2018  . GAD (generalized anxiety disorder) 03/20/2017  . 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 Chronic pain of right knee 06/07/2016  . Spider varicose veins 09/01/2015  . Cough 05/19/2014  . Palpitations 10/29/2013  . Arthritis 10/09/2013  . Pulmonary embolus (HCC) 02/15/2010  . Hypercoagulable state (HCC) 02/15/2010    Past Surgical History:  Procedure Laterality Date  . bladder sur    . CESAREAN SECTION     2  . ELBOW SURGERY  2009  . knee repla Left 07/2016   partial knee replacement   . ROTATOR CUFF REPAIR   2011  . TONSILLECTOMY  ?1968     OB History   No obstetric history on file.     Family History  Problem Relation Age of Onset  . Protein S deficiency Mother   . Clotting disorder Mother   . Heart disease Father   . Diabetes Father   . Headache Father   . Colon cancer Paternal Grandfather   . Stroke Brother   . Diabetes Paternal Aunt   . Diabetes Paternal Uncle   . Colon cancer Paternal Grandmother 34  . Colon cancer Cousin 35       paternal    Social History   Tobacco Use  . Smoking status: Never Smoker  . Smokeless tobacco: Never Used  Vaping Use  . Vaping Use: Never used  Substance Use Topics  . Alcohol use: Yes    Comment: Socially  . Drug use: No    Home Medications Prior to Admission medications   Medication Sig Start Date End Date Taking? Authorizing Provider  buPROPion (WELLBUTRIN XL) 300 MG 24 hr tablet Take 1 tablet (300 mg total) by mouth daily. Need Office Visit for additional refills 11/12/17  Yes Nche, Bonna Gains, NP  hydrocortisone 2.5 % cream Apply topically 2 (two) times daily. Patient taking differently: Apply 1 application topically as needed (poison ivy).  05/24/19  Yes Nche, Bonna Gains, NP  levothyroxine (SYNTHROID, LEVOTHROID) 88 MCG  tablet Take 88 mcg by mouth daily before breakfast.   Yes [provider]  metoprolol succinate (TOPROL-XL) 25 MG 24 hr tablet Take 1 tablet (25 mg total) by mouth daily as needed. Patient taking differently: Take 25 mg by mouth as needed (heart).  11/05/18  Yes Nche, Bonna Gains, NP  pantoprazole (PROTONIX) 20 MG tablet Take 1 tablet (20 mg total) by mouth daily. Patient taking differently: Take 20 mg by mouth as needed for heartburn (acid reflux).  11/05/18  Yes Nche, Bonna Gains, NP  warfarin (COUMADIN) 6 MG tablet TAKE AS DIRECTED BY ANTICOAGULATION CLINIC Patient taking differently: Take 6 mg by mouth daily. TAKE AS DIRECTED BY ANTICOAGULATION CLINIC. 03/17/19  Yes Nche, Bonna Gains, NP    calamine lotion Apply 1 application topically as needed for itching. Patient not taking: Reported on 07/06/2019 04/03/18   Nche, Bonna Gains, NP  Melatonin 5 MG TABS Take 1-2 tablets (5-10 mg total) by mouth at bedtime as needed. Patient not taking: Reported on 07/06/2019 11/05/18   Nche, Bonna Gains, NP    Allergies    Patient has no known allergies.  Review of Systems   Review of Systems  Constitutional: Positive for activity change.  Respiratory: Negative for shortness of breath.   Cardiovascular: Negative for chest pain.  Gastrointestinal: Positive for abdominal pain. Negative for nausea and vomiting.  Genitourinary: Negative for dysuria.  Hematological: Bruises/bleeds easily.  All other systems reviewed and are negative.   Physical Exam Updated Vital Signs BP 123/60   Pulse 85   Temp 98 F (36.7 C) (Oral)   Resp 15   SpO2 96%   Physical Exam Vitals and nursing note reviewed.  Constitutional:      Appearance: She is well-developed.  HENT:     Head: Normocephalic and atraumatic.  Eyes:     Pupils: Pupils are equal, round, and reactive to light.  Cardiovascular:     Rate and Rhythm: Normal rate and regular rhythm.     Heart sounds: Normal heart sounds. No murmur heard.   Pulmonary:     Effort: Pulmonary effort is normal. No respiratory distress.  Abdominal:     General: There is no distension.     Palpations: Abdomen is soft.     Tenderness: There is abdominal tenderness in the epigastric area. There is guarding. There is no rebound. Positive signs include Murphy's sign.  Musculoskeletal:     Cervical back: Neck supple.  Skin:    General: Skin is warm and dry.  Neurological:     Mental Status: She is alert and oriented to person, place, and time.     ED Results / Procedures / Treatments   Labs (all labs ordered are listed, but only abnormal results are displayed) Labs Reviewed  LIPASE, BLOOD - Abnormal; Notable for the following components:      Result  Value   Lipase 80 (*)    All other components within normal limits  COMPREHENSIVE METABOLIC PANEL - Abnormal; Notable for the following components:   Glucose, Bld 111 (*)    All other components within normal limits  CBC - Abnormal; Notable for the following components:   WBC 12.9 (*)    All other components within normal limits  PROTIME-INR - Abnormal; Notable for the following components:   Prothrombin Time 25.2 (*)    INR 2.4 (*)    All other components within normal limits  APTT - Abnormal; Notable for the following components:   aPTT 55 (*)  All other components within normal limits  I-STAT BETA HCG BLOOD, ED (MC, [facility], AP ONLY) - Abnormal; Notable for the following components:   I-stat hCG, quantitative 5.0 (*)    All other components within normal limits  SARS CORONAVIRUS 2 BY RT PCR (HOSPITAL ORDER, PERFORMED IN [facility])  URINALYSIS, ROUTINE W REFLEX MICROSCOPIC    EKG None  Radiology US Abdomen Limited RUQ  Result Date: 07/06/2019 CLINICAL DATA:  Right upper quadrant pain EXAM: ULTRASOUND ABDOMEN LIMITED RIGHT UPPER QUADRANT COMPARISON:  Ultrasound 07/29/2016, CTa chest 01/11/2017 FINDINGS: Gallbladder: Mild gallbladder distension with the gallbladder measuring up to 11 cm in length. Gallbladder contains some echogenic biliary sludge layering dependently. No visible calcified gallstones. Previously seen gallbladder polyps are not visualized on this exam. Borderline gallbladder wall thickening to 3.3 mm. Sonographic Eulah PontMurphy sign is reportedly negative however patient was pre-medicated prior to this examination. Common bile duct: Diameter: 7.2 mm, borderline dilated. Liver: No focal lesion identified. Within normal limits in parenchymal echogenicity. Previously seen hepatic cyst is not visualized on this exam. Portal vein is patent on color Doppler imaging with normal direction of blood flow towards the liver. Other: None. IMPRESSION: Small amount of biliary sludge  with mild gallbladder wall thickening and distention. Patient was pre-medicated prior to the examination limiting clinical utility of a sonographic Murphy sign. Findings could reflect acute cholecystitis in the appropriate clinical setting. Borderline dilatation of the common bile duct at 7.2 mm. Recommend correlation with liver serologies. If there is persisting concern for a choledocholithiasis, MRCP could be obtained. Electronically Signed   By: Kreg ShropshirePrice  DeHay M.D.   On: 07/06/2019 19:36    Procedures Procedures (including critical care time)  Medications Ordered in ED Medications  sodium chloride flush (NS) 0.9 % injection 3 mL (has no administration in time range)  lactated ringers infusion ( Intravenous New Bag/Given 07/06/19 1927)  cefTRIAXone (ROCEPHIN) 2 g in sodium chloride 0.9 % 100 mL IVPB (2 g Intravenous New Bag/Given 07/06/19 2037)  morphine 4 MG/ML injection 8 mg (8 mg Intravenous Given 07/06/19 1856)  ondansetron (ZOFRAN) injection 4 mg (4 mg Intravenous Given 07/06/19 1856)  sodium chloride 0.9 % bolus 1,000 mL (0 mLs Intravenous Stopped 07/06/19 2038)    ED Course  I have reviewed the triage vital signs and the nursing notes.  Pertinent labs & imaging results that were available during my care of the patient were reviewed by me and considered in my medical decision making (see chart for details).    MDM Rules/Calculators/A&P                          59 year old comes in a chief complaint of abdominal pain. She is noted to have right upper quadrant tenderness with Murphy's. Ultrasound had to be ordered in our system and it shows cholelithiasis with mild gallbladder wall thickening and mildly dilated common bile duct.  Her lipase is elevated at 80.  She is on Coumadin because of her protein S deficiency.  I have discussed the case with Dr. Maisie Fushomas, general surgery and they recommend that patient be admitted by medicine.  Her INR is over 2, and we will defer to medicine team on how  best to reverse her Coumadin, given the underlying protein S deficiency that has caused thrombosis in the past.  Pain control for now. Patient has been informed of the plan.  Antibiotics initiated. Clinically patient is not septic.  Final Clinical Impression(s) / ED  Diagnoses Final diagnoses:  RUQ abdominal pain  Calculus of gallbladder without cholecystitis without obstruction    Rx / DC Orders ED Discharge Orders    None       Derwood Kaplan, MD 07/06/19 2043

## 2019-07-06 NOTE — ED Triage Notes (Signed)
Patient here from home reporting right sided abd pain, n/v that started yest morning. [place] for gallbladder removal but "did not trust the hospital".

## 2019-07-07 ENCOUNTER — Inpatient Hospital Stay (HOSPITAL_COMMUNITY): Payer: PRIVATE HEALTH INSURANCE

## 2019-07-07 ENCOUNTER — Inpatient Hospital Stay (HOSPITAL_COMMUNITY): Payer: PRIVATE HEALTH INSURANCE | Admitting: Certified Registered Nurse Anesthetist

## 2019-07-07 ENCOUNTER — Encounter (HOSPITAL_COMMUNITY)
Admission: EM | Disposition: A | Discharge status: Home / Self Care | Payer: Self-pay | Source: Home / Self Care | Attending: Family Medicine

## 2019-07-07 HISTORY — PX: CHOLECYSTECTOMY: SHX55

## 2019-07-07 LAB — CBC
HCT: 40.5 % (ref 36.0–46.0)
Hemoglobin: 13.3 g/dL (ref 12.0–15.0)
MCH: 30.6 pg (ref 26.0–34.0)
MCHC: 32.8 g/dL (ref 30.0–36.0)
MCV: 93.1 fL (ref 80.0–100.0)
Platelets: 186 10*3/uL (ref 150–400)
RBC: 4.35 MIL/uL (ref 3.87–5.11)
RDW: 11.9 % (ref 11.5–15.5)
WBC: 11.7 10*3/uL — ABNORMAL HIGH (ref 4.0–10.5)
nRBC: 0 % (ref 0.0–0.2)

## 2019-07-07 LAB — COMPREHENSIVE METABOLIC PANEL
ALT: 13 U/L (ref 0–44)
AST: 14 U/L — ABNORMAL LOW (ref 15–41)
Albumin: 3.7 g/dL (ref 3.5–5.0)
Alkaline Phosphatase: 53 U/L (ref 38–126)
Anion gap: 10 (ref 5–15)
BUN: 10 mg/dL (ref 6–20)
CO2: 25 mmol/L (ref 22–32)
Calcium: 8.3 mg/dL — ABNORMAL LOW (ref 8.9–10.3)
Chloride: 104 mmol/L (ref 98–111)
Creatinine, Ser: 0.67 mg/dL (ref 0.44–1.00)
GFR calc Af Amer: >60 mL/min (ref >60)
GFR calc non Af Amer: >60 mL/min (ref >60)
Glucose, Bld: 108 mg/dL — ABNORMAL HIGH (ref 70–99)
Potassium: 3.6 mmol/L (ref 3.5–5.1)
Sodium: 139 mmol/L (ref 135–145)
Total Bilirubin: 1.1 mg/dL (ref 0.3–1.2)
Total Protein: 6.6 g/dL (ref 6.5–8.1)

## 2019-07-07 LAB — PROTIME-INR
INR: 1.7 — ABNORMAL HIGH (ref 0.8–1.2)
Prothrombin Time: 19.2 seconds — ABNORMAL HIGH (ref 11.4–15.2)

## 2019-07-07 LAB — TYPE AND SCREEN
ABO/RH(D): O POS
Antibody Screen: NEGATIVE

## 2019-07-07 LAB — LIPASE, BLOOD: Lipase: 67 U/L — ABNORMAL HIGH (ref 11–51)

## 2019-07-07 LAB — HIV ANTIBODY (ROUTINE TESTING W REFLEX): HIV Screen 4th Generation wRfx: NONREACTIVE

## 2019-07-07 LAB — ABO/RH: ABO/RH(D): O POS

## 2019-07-07 SURGERY — LAPAROSCOPIC CHOLECYSTECTOMY WITH INTRAOPERATIVE CHOLANGIOGRAM
Anesthesia: General | Site: Abdomen

## 2019-07-07 MED ORDER — DEXAMETHASONE SODIUM PHOSPHATE 10 MG/ML IJ SOLN
INTRAMUSCULAR | Status: AC
  Filled 2019-07-07: qty 1

## 2019-07-07 MED ORDER — FENTANYL CITRATE (PF) 100 MCG/2ML IJ SOLN
25.0000 ug | INTRAMUSCULAR | Status: DC | PRN
  Administered 2019-07-07 (×2): 50 ug via INTRAVENOUS

## 2019-07-07 MED ORDER — MIDAZOLAM HCL 2 MG/2ML IJ SOLN
INTRAMUSCULAR | Status: AC
  Filled 2019-07-07: qty 2

## 2019-07-07 MED ORDER — LACTATED RINGERS IV SOLN: INTRAVENOUS | Status: DC | PRN

## 2019-07-07 MED ORDER — DEXAMETHASONE SODIUM PHOSPHATE 10 MG/ML IJ SOLN
INTRAMUSCULAR | Status: DC | PRN
  Administered 2019-07-07: 10 mg via INTRAVENOUS

## 2019-07-07 MED ORDER — FENTANYL CITRATE (PF) 100 MCG/2ML IJ SOLN
INTRAMUSCULAR | Status: AC
  Filled 2019-07-07: qty 2

## 2019-07-07 MED ORDER — ONDANSETRON HCL 4 MG/2ML IJ SOLN
INTRAMUSCULAR | Status: DC | PRN
  Administered 2019-07-07: 4 mg via INTRAVENOUS

## 2019-07-07 MED ORDER — METOPROLOL SUCCINATE ER 25 MG PO TB24: 25.0000 mg | ORAL_TABLET | Freq: Every day | ORAL | Status: DC

## 2019-07-07 MED ORDER — LACTATED RINGERS IV SOLN
INTRAVENOUS | Status: AC | PRN
  Administered 2019-07-07: 1000 mL

## 2019-07-07 MED ORDER — PHENYLEPHRINE 40 MCG/ML (10ML) SYRINGE FOR IV PUSH (FOR BLOOD PRESSURE SUPPORT)
PREFILLED_SYRINGE | INTRAVENOUS | Status: AC
  Filled 2019-07-07: qty 10

## 2019-07-07 MED ORDER — MIDAZOLAM HCL 5 MG/5ML IJ SOLN
INTRAMUSCULAR | Status: DC | PRN
  Administered 2019-07-07: 2 mg via INTRAVENOUS

## 2019-07-07 MED ORDER — LEVOTHYROXINE SODIUM 88 MCG PO TABS
88.0000 ug | ORAL_TABLET | Freq: Every day | ORAL | Status: DC
  Administered 2019-07-08: 88 ug via ORAL
  Filled 2019-07-07: qty 1

## 2019-07-07 MED ORDER — MELATONIN 3 MG PO TABS
3.0000 mg | ORAL_TABLET | Freq: Every day | ORAL | Status: DC
  Administered 2019-07-07: 3 mg via ORAL
  Filled 2019-07-07: qty 1

## 2019-07-07 MED ORDER — SODIUM CHLORIDE 0.9% IV SOLUTION: Freq: Once | INTRAVENOUS | Status: DC

## 2019-07-07 MED ORDER — FENTANYL CITRATE (PF) 250 MCG/5ML IJ SOLN
INTRAMUSCULAR | Status: AC
  Filled 2019-07-07: qty 5

## 2019-07-07 MED ORDER — IOHEXOL 300 MG/ML  SOLN
INTRAMUSCULAR | Status: DC | PRN
  Administered 2019-07-07: 5 mL

## 2019-07-07 MED ORDER — PROPOFOL 10 MG/ML IV BOLUS
INTRAVENOUS | Status: AC
  Filled 2019-07-07: qty 20

## 2019-07-07 MED ORDER — ONDANSETRON HCL 4 MG/2ML IJ SOLN
INTRAMUSCULAR | Status: AC
  Filled 2019-07-07: qty 2

## 2019-07-07 MED ORDER — LIDOCAINE 2% (20 MG/ML) 5 ML SYRINGE
INTRAMUSCULAR | Status: AC
  Filled 2019-07-07: qty 5

## 2019-07-07 MED ORDER — SCOPOLAMINE 1 MG/3DAYS TD PT72
MEDICATED_PATCH | TRANSDERMAL | Status: AC
  Filled 2019-07-07: qty 1

## 2019-07-07 MED ORDER — ROCURONIUM BROMIDE 10 MG/ML (PF) SYRINGE
PREFILLED_SYRINGE | INTRAVENOUS | Status: DC | PRN
  Administered 2019-07-07: 70 mg via INTRAVENOUS

## 2019-07-07 MED ORDER — PANTOPRAZOLE SODIUM 20 MG PO TBEC
20.0000 mg | DELAYED_RELEASE_TABLET | Freq: Every day | ORAL | Status: DC
  Administered 2019-07-08: 20 mg via ORAL
  Filled 2019-07-07 (×2): qty 1

## 2019-07-07 MED ORDER — PROPOFOL 10 MG/ML IV BOLUS
INTRAVENOUS | Status: DC | PRN
  Administered 2019-07-07: 130 mg via INTRAVENOUS

## 2019-07-07 MED ORDER — BUPIVACAINE-EPINEPHRINE 0.25% -1:200000 IJ SOLN
INTRAMUSCULAR | Status: DC | PRN
  Administered 2019-07-07: 23 mL

## 2019-07-07 MED ORDER — BUPIVACAINE-EPINEPHRINE 0.25% -1:200000 IJ SOLN
INTRAMUSCULAR | Status: AC
  Filled 2019-07-07: qty 1

## 2019-07-07 MED ORDER — 0.9 % SODIUM CHLORIDE (POUR BTL) OPTIME
TOPICAL | Status: DC | PRN
  Administered 2019-07-07: 1000 mL

## 2019-07-07 MED ORDER — EPHEDRINE 5 MG/ML INJ
INTRAVENOUS | Status: AC
  Filled 2019-07-07: qty 10

## 2019-07-07 MED ORDER — ROCURONIUM BROMIDE 10 MG/ML (PF) SYRINGE
PREFILLED_SYRINGE | INTRAVENOUS | Status: AC
  Filled 2019-07-07: qty 10

## 2019-07-07 MED ORDER — FENTANYL CITRATE (PF) 100 MCG/2ML IJ SOLN
INTRAMUSCULAR | Status: DC | PRN
  Administered 2019-07-07: 50 ug via INTRAVENOUS
  Administered 2019-07-07: 100 ug via INTRAVENOUS

## 2019-07-07 MED ORDER — BUPROPION HCL ER (XL) 300 MG PO TB24
300.0000 mg | ORAL_TABLET | Freq: Every day | ORAL | Status: DC
  Administered 2019-07-07 – 2019-07-08 (×2): 300 mg via ORAL
  Filled 2019-07-07 (×2): qty 1

## 2019-07-07 MED ORDER — HYDROCODONE-ACETAMINOPHEN 5-325 MG PO TABS
1.0000 | ORAL_TABLET | ORAL | Status: DC | PRN
  Administered 2019-07-08: 1 via ORAL
  Filled 2019-07-07: qty 1

## 2019-07-07 MED ORDER — LIDOCAINE 2% (20 MG/ML) 5 ML SYRINGE
INTRAMUSCULAR | Status: DC | PRN
  Administered 2019-07-07: 60 mg via INTRAVENOUS

## 2019-07-07 MED ORDER — SUGAMMADEX SODIUM 200 MG/2ML IV SOLN
INTRAVENOUS | Status: DC | PRN
  Administered 2019-07-07: 200 mg via INTRAVENOUS

## 2019-07-07 SURGICAL SUPPLY — 34 items
ADH SKN CLS APL DERMABOND .7 (GAUZE/BANDAGES/DRESSINGS) ×1
APL PRP STRL LF DISP 70% ISPRP (MISCELLANEOUS) ×1
APPLIER CLIP 5 13 M/L LIGAMAX5 (MISCELLANEOUS) ×3
APR CLP MED LRG 5 ANG JAW (MISCELLANEOUS) ×1
BAG SPEC RTRVL LRG 6X4 10 (ENDOMECHANICALS) ×1
CABLE HIGH FREQUENCY MONO STRZ (ELECTRODE) ×3 IMPLANT
CATH REDDICK CHOLANGI 4FR 50CM (CATHETERS) ×3 IMPLANT
CHLORAPREP W/TINT 26 (MISCELLANEOUS) ×3 IMPLANT
CLIP APPLIE 5 13 M/L LIGAMAX5 (MISCELLANEOUS) ×1 IMPLANT
COVER MAYO STAND STRL (DRAPES) ×3 IMPLANT
COVER WAND RF STERILE (DRAPES) IMPLANT
DECANTER SPIKE VIAL GLASS SM (MISCELLANEOUS) ×3 IMPLANT
DERMABOND ADVANCED (GAUZE/BANDAGES/DRESSINGS) ×2
DERMABOND ADVANCED .7 DNX12 (GAUZE/BANDAGES/DRESSINGS) ×1 IMPLANT
DRAPE C-ARM 42X120 X-RAY (DRAPES) ×3 IMPLANT
ELECT REM PT RETURN 15FT ADLT (MISCELLANEOUS) ×3 IMPLANT
GLOVE BIO SURGEON STRL SZ7.5 (GLOVE) ×3 IMPLANT
GOWN STRL REUS W/TWL XL LVL3 (GOWN DISPOSABLE) ×9 IMPLANT
HEMOSTAT SURGICEL 4X8 (HEMOSTASIS) IMPLANT
IV CATH 14GX2 1/4 (CATHETERS) ×3 IMPLANT
KIT BASIN (CUSTOM PROCEDURE TRAY) ×3 IMPLANT
KIT TURNOVER KIT A (KITS) IMPLANT
PENCIL SMOKE EVACUATOR (MISCELLANEOUS) IMPLANT
POUCH SPECIMEN RETRIEVAL 10MM (ENDOMECHANICALS) ×3 IMPLANT
SCISSORS LAP 5X35 DISP (ENDOMECHANICALS) ×3 IMPLANT
SET IRRIG TUBING LAPAROSCOPIC (IRRIGATION / IRRIGATOR) ×3 IMPLANT
SET TUBE SMOKE EVAC HIGH FLOW (TUBING) ×3 IMPLANT
SLEEVE XCEL OPT CAN 5 100 (ENDOMECHANICALS) ×6 IMPLANT
SUT MNCRL AB 4-0 PS2 18 (SUTURE) ×3 IMPLANT
TOWEL OR 17X26 10 PK STRL BLUE (TOWEL DISPOSABLE) ×3 IMPLANT
TOWEL OR NON WOVEN STRL DISP B (DISPOSABLE) ×3 IMPLANT
TRAY LAPAROSCOPIC (CUSTOM PROCEDURE TRAY) ×3 IMPLANT
TROCAR BLADELESS OPT 5 100 (ENDOMECHANICALS) ×3 IMPLANT
TROCAR XCEL BLUNT TIP 100MML (ENDOMECHANICALS) ×3 IMPLANT

## 2019-07-07 NOTE — Anesthesia Procedure Notes (Signed)
Procedure Name: Intubation Date/Time: 07/07/2019 10:18 AM Performed by: Gerald Leitz, CRNA Pre-anesthesia Checklist: Patient identified, Patient being monitored, Timeout performed, Emergency Drugs available and Suction available Patient Re-evaluated:Patient Re-evaluated prior to induction Oxygen Delivery Method: Circle system utilized Preoxygenation: Pre-oxygenation with 100% oxygen Induction Type: IV induction Ventilation: Mask ventilation without difficulty Laryngoscope Size: Mac and 3 Grade View: Grade I Tube type: Oral Tube size: 7.0 mm Number of attempts: 1 Placement Confirmation: ETT inserted through vocal cords under direct vision,  positive ETCO2 and breath sounds checked- equal and bilateral Secured at: 22 cm Tube secured with: Tape Dental Injury: Teeth and Oropharynx as per pre-operative assessment

## 2019-07-07 NOTE — Progress Notes (Signed)
Pt taken down to OR, lab called and stated that FPP was ready and OR was notified. Pt's wedding ring was taken off and placed in a denture cup and given to the pt's husband who is waiting in the room.

## 2019-07-07 NOTE — Progress Notes (Signed)
PROGRESS NOTE    Melinda Benton  FTD:[id number] DOB: 08-21-1960 DOA: 07/06/2019   PCP: Anne Ng, NP   Brief Narrative:  Melinda Benton a 59 y.o.femalewith medical history significant ofprotein S deficiency on Coumadin, mitral valve prolapse, hypothyroidism, GERD presenting with complaints of abdominal pain, nausea, and vomiting.  Patient reports 2-day history of severe right upper quadrant abdominal pain, nausea, and vomiting.  States she was out of town at [employer] and went to the local hospital there.  States they did an ultrasound and told her her gallbladder was abnormal and recommended surgery to remove it.  She did not want the surgery done at that hospital and returned to [facility] today. Right upper quadrant ultrasound showed common bile duct stone, General surgery consulted.  She underwent laparoscopic cholecystectomy with intraoperative cholangiogram.  Tolerated well.   Assessment & Plan:   Principal Problem:   Acute cholecystitis Active Problems:   Hypercoagulable state (HCC)   Hypothyroidism    Acute cholecystitis and possible choledocholithiasis:  Patient is presenting with complaints of right upper quadrant abdominal pain, nausea, and vomiting.Labs showing mild leukocytosis. No fever or signs of sepsis. Lipase 80, LFTs normal. Right upper quadrant ultrasound showing small amount of biliary sludge with mild gallbladder wall thickening and distention.  Positive Murphy sign on exam.  Borderline dilatation of the common bile duct at 7.2 mm.  -Underwent laparoscopic cholecystectomy with intraoperative cholangiogram. -Ceftriaxone and Flagyl -Zofran as needed for nausea/vomiting -Morphine as needed for pain -Repeat labs in a.m.: CBC, CMP, INR, lipase   History of protein S deficiency: Patient is on Coumadin. INR currently 2.4.  -Hold Coumadin at this time and give IV vitamin K 5 mg once -We will discuss with general surgery when to resume  Coumadin  Hypothyroidism -Continue Synthroid  HIV screening -The patient falls between the ages of 13-64 and should be screened for HIV, therefore HIV testing ordered.  DVT prophylaxis: SCDs Code Status: Full code Family Communication: Husband at bedside. Disposition Plan:  Anticipated discharge home in 1 to 2 days y   Dispo: The patient is from: Home  Anticipated d/c is to: Home  Anticipated d/c date is: 3 days  Patient currently is not medically stable to d/c.   Consultants:   General Surgery  Procedures:  Laparoscopic cholecystectomy with intraoperative cholangiogram  Antimicrobials:  Anti-infectives (From admission, onward)   Start     Dose/Rate Route Frequency Ordered Stop   07/07/19 2000  cefTRIAXone (ROCEPHIN) 2 g in sodium chloride 0.9 % 100 mL IVPB     Discontinue     2 g 200 mL/hr over 30 Minutes Intravenous Every 24 hours 07/06/19 2126     07/06/19 2200  metroNIDAZOLE (FLAGYL) IVPB 500 mg     Discontinue     500 mg 100 mL/hr over 60 Minutes Intravenous Every 8 hours 07/06/19 2126     07/06/19 2045  cefTRIAXone (ROCEPHIN) 2 g in sodium chloride 0.9 % 100 mL IVPB        2 g 200 mL/hr over 30 Minutes Intravenous  Once 07/06/19 2032 07/06/19 2200      Subjective: Patient is seen and examined at bedside.  She is a s/p laparoscopic cholecystectomy with intraoperative cholangiogram,  tolerated well. She reports feeling pain over operative area, denies any nausea and vomiting.  Objective: Vitals:   07/07/19 1200 07/07/19 1215 07/07/19 1230 07/07/19 1254  BP: 140/75 134/68 129/71 127/71  Pulse: 84 78 79 80  Resp: (!) 23 13 16  16  Temp:   98.4 F (36.9 C) 98.4 F (36.9 C)  TempSrc:    Oral  SpO2: 98% 94% 93% 94%  Height:        Intake/Output Summary (Last 24 hours) at 07/07/2019 1324 Last data filed at 07/07/2019 1115 Gross per 24 hour  Intake 3501.67 ml  Output 15 ml  Net 3486.67 ml   There were no vitals filed  for this visit.  Examination:  General exam: Appears calm and comfortable  Respiratory system: Clear to auscultation. Respiratory effort normal. Cardiovascular system: S1 & S2 heard, RRR. No JVD, murmurs, rubs, gallops or clicks. No pedal edema. Gastrointestinal system: Abdomen is nondistended, soft and mildly tender tender.  Normal bowel sounds heard. Central nervous system: Alert and oriented. No focal neurological deficits. Extremities: No edema, no swelling, no cyanosis Skin: No rashes, lesions or ulcers Psychiatry: Judgement and insight appear normal. Mood & affect appropriate.     Data Reviewed: I have personally reviewed following labs and imaging studies  CBC: Recent Labs  Lab 07/06/19 1734 07/07/19 0425  WBC 12.9* 11.7*  HGB 14.1 13.3  HCT 42.5 40.5  MCV 91.8 93.1  PLT 192 186   Basic Metabolic Panel: Recent Labs  Lab 07/06/19 1734 07/07/19 0425  NA 138 139  K 3.6 3.6  CL 100 104  CO2 28 25  GLUCOSE 111* 108*  BUN 11 10  CREATININE 0.86 0.67  CALCIUM 9.0 8.3*   GFR: CrCl cannot be calculated (Unknown ideal weight.). Liver Function Tests: Recent Labs  Lab 07/06/19 1734 07/07/19 0425  AST 19 14*  ALT 17 13  ALKPHOS 57 53  BILITOT 1.1 1.1  PROT 7.6 6.6  ALBUMIN 4.4 3.7   Recent Labs  Lab 07/06/19 1734 07/07/19 0425  LIPASE 80* 67*   No results for input(s): AMMONIA in the last 168 hours. Coagulation Profile: Recent Labs  Lab 07/06/19 1832 07/07/19 0425  INR 2.4* 1.7*   Cardiac Enzymes: No results for input(s): CKTOTAL, CKMB, CKMBINDEX, TROPONINI in the last 168 hours. BNP (last 3 results) No results for input(s): PROBNP in the last 8760 hours. HbA1C: No results for input(s): HGBA1C in the last 72 hours. CBG: No results for input(s): GLUCAP in the last 168 hours. Lipid Profile: No results for input(s): CHOL, HDL, LDLCALC, TRIG, CHOLHDL, LDLDIRECT in the last 72 hours. Thyroid Function Tests: No results for input(s): TSH, T4TOTAL,  FREET4, T3FREE, THYROIDAB in the last 72 hours. Anemia Panel: No results for input(s): VITAMINB12, FOLATE, FERRITIN, TIBC, IRON, RETICCTPCT in the last 72 hours. Sepsis Labs: No results for input(s): PROCALCITON, LATICACIDVEN in the last 168 hours.  Recent Results (from the past 240 hour(s))  SARS Coronavirus 2 by RT PCR (hospital order, performed in [facility] hospital lab) Nasopharyngeal Nasopharyngeal Swab     Status: None   Collection Time: 07/06/19  6:49 PM   Specimen: Nasopharyngeal Swab  Result Value Ref Range Status   SARS Coronavirus 2 NEGATIVE NEGATIVE Final    Comment: (NOTE) SARS-CoV-2 target nucleic acids are NOT DETECTED.  The SARS-CoV-2 RNA is generally detectable in upper and lower respiratory specimens during the acute phase of infection. The lowest concentration of SARS-CoV-2 viral copies this assay can detect is 250 copies / mL. A negative result does not preclude SARS-CoV-2 infection and should not be used as the sole basis for treatment or other patient management decisions.  A negative result may occur with improper specimen collection / handling, submission of specimen other than nasopharyngeal swab,  presence of viral mutation(s) within the areas targeted by this assay, and inadequate number of viral copies (<250 copies / mL). A negative result must be combined with clinical observations, patient history, and epidemiological information.  Fact Sheet for Patients:   BoilerBrush.com.cy  Fact Sheet for Healthcare Providers: https://pope.com/  This test is not yet approved or  cleared by the [place] FDA and has been authorized for detection and/or diagnosis of SARS-CoV-2 by FDA under an Emergency Use Authorization (EUA).  This EUA will remain in effect (meaning this test can be used) for the duration of the COVID-19 declaration under Section 564(b)(1) of the Act, 21 U.S.C. section 360bbb-3(b)(1), unless the  authorization is terminated or revoked sooner.  Performed at [facility], [address] [place], [place] [address]      Radiology Studies: DG Cholangiogram Operative  Result Date: 07/07/2019 CLINICAL DATA:  59 year old female with a history of cholelithiasis EXAM: INTRAOPERATIVE CHOLANGIOGRAM TECHNIQUE: Cholangiographic images from the C-arm fluoroscopic device were submitted for interpretation post-operatively. Please see the procedural report for the amount of contrast and the fluoroscopy time utilized. COMPARISON:  None. FINDINGS: Surgical instruments project over the upper abdomen. There is cannulation of the cystic duct/gallbladder neck, with antegrade infusion of contrast. Caliber of the extrahepatic ductal system within normal limits. No definite filling defect within the extrahepatic ducts identified. Free flow of contrast across the ampulla. IMPRESSION: Intraoperative cholangiogram demonstrates extrahepatic biliary ducts of unremarkable caliber, with no definite filling defects identified. Free flow of contrast across the ampulla. Please refer to the dictated operative report for full details of intraoperative findings and procedure Electronically Signed   By: Gilmer Mor D.O.   On: 07/07/2019 12:17   US Abdomen Limited RUQ  Result Date: 07/06/2019 CLINICAL DATA:  Right upper quadrant pain EXAM: ULTRASOUND ABDOMEN LIMITED RIGHT UPPER QUADRANT COMPARISON:  Ultrasound 07/29/2016, CTa chest 01/11/2017 FINDINGS: Gallbladder: Mild gallbladder distension with the gallbladder measuring up to 11 cm in length. Gallbladder contains some echogenic biliary sludge layering dependently. No visible calcified gallstones. Previously seen gallbladder polyps are not visualized on this exam. Borderline gallbladder wall thickening to 3.3 mm. Sonographic Eulah Pont sign is reportedly negative however patient was pre-medicated prior to this examination. Common bile duct: Diameter: 7.2 mm,  borderline dilated. Liver: No focal lesion identified. Within normal limits in parenchymal echogenicity. Previously seen hepatic cyst is not visualized on this exam. Portal vein is patent on color Doppler imaging with normal direction of blood flow towards the liver. Other: None. IMPRESSION: Small amount of biliary sludge with mild gallbladder wall thickening and distention. Patient was pre-medicated prior to the examination limiting clinical utility of a sonographic Murphy sign. Findings could reflect acute cholecystitis in the appropriate clinical setting. Borderline dilatation of the common bile duct at 7.2 mm. Recommend correlation with liver serologies. If there is persisting concern for a choledocholithiasis, MRCP could be obtained. Electronically Signed   By: Kreg Shropshire M.D.   On: 07/06/2019 19:36    Scheduled Meds: . sodium chloride   Intravenous Once  . buPROPion  300 mg Oral Daily  . fentaNYL      . [START ON 07/08/2019] levothyroxine  88 mcg Oral QAC breakfast  . metoprolol succinate  25 mg Oral Daily  . pantoprazole  20 mg Oral Daily  . scopolamine      . sodium chloride flush  3 mL Intravenous Once   Continuous Infusions: . cefTRIAXone (ROCEPHIN)  IV    . metronidazole 500 mg (07/07/19 6333)  LOS: 1 day    Time spent: 25 mins.    Shawna Clamp, MD [facility]   If 7PM-7AM, please contact night-coverage

## 2019-07-07 NOTE — Anesthesia Postprocedure Evaluation (Signed)
Anesthesia Post Note  Patient: Melinda Benton  Procedure(s) Performed: LAPAROSCOPIC CHOLECYSTECTOMY WITH INTRAOPERATIVE CHOLANGIOGRAM (N/A Abdomen)     Patient location during evaluation: PACU Anesthesia Type: General Level of consciousness: awake and alert Pain management: pain level controlled Vital Signs Assessment: post-procedure vital signs reviewed and stable Respiratory status: spontaneous breathing, nonlabored ventilation, respiratory function stable and patient connected to nasal cannula oxygen Cardiovascular status: blood pressure returned to baseline and stable Postop Assessment: no apparent nausea or vomiting Anesthetic complications: no   No complications documented.  Last Vitals:  Vitals:   07/07/19 1145 07/07/19 1200  BP: (!) 141/75 140/75  Pulse: 82 84  Resp: 20 (!) 23  Temp:    SpO2: 100% 98%    Last Pain:  Vitals:   07/07/19 1200  TempSrc:   PainSc: 5                  Spero Gunnels L Arthuro Canelo

## 2019-07-07 NOTE — Transfer of Care (Signed)
Immediate Anesthesia Transfer of Care Note  Patient: Melinda Benton  Procedure(s) Performed: Procedure(s): LAPAROSCOPIC CHOLECYSTECTOMY WITH INTRAOPERATIVE CHOLANGIOGRAM (N/A)  Patient Location: PACU  Anesthesia Type:General  Level of Consciousness: Alert, Awake, Oriented  Airway & Oxygen Therapy: Patient Spontanous Breathing  Post-op Assessment: Report given to RN  Post vital signs: Reviewed and stable  Last Vitals:  Vitals:   07/07/19 0408 07/07/19 0905  BP: (!) 110/58 123/70  Pulse: 80 79  Resp: 16 16  Temp: 36.9 C 37.1 C  SpO2: 99% 95%    Complications: No apparent anesthesia complications

## 2019-07-07 NOTE — Progress Notes (Signed)
Subjective/Chief Complaint: Complains of RUQ and epigastric pain   Objective: Vital signs in last 24 hours: Temp:  [98 F (36.7 C)-100.4 F (38 C)] 98.4 F (36.9 C) (07/04 0408) Pulse Rate:  [71-89] 80 (07/04 0408) Resp:  [15-19] 16 (07/04 0408) BP: (90-146)/(58-89) 110/58 (07/04 0408) SpO2:  [94 %-100 %] 99 % (07/04 0408)    Intake/Output from previous day: 07/03 0701 - 07/04 0700 In: 2291.7 [I.V.:1041.7; IV Piggyback:1250] Out: -  Intake/Output this shift: No intake/output data recorded.  General appearance: alert and cooperative Resp: clear to auscultation bilaterally Cardio: regular rate and rhythm GI: soft, moderate epigastric tenderness  Lab Results:  Recent Labs    07/06/19 1734 07/07/19 0425  WBC 12.9* 11.7*  HGB 14.1 13.3  HCT 42.5 40.5  PLT 192 186   BMET Recent Labs    07/06/19 1734 07/07/19 0425  NA 138 139  K 3.6 3.6  CL 100 104  CO2 28 25  GLUCOSE 111* 108*  BUN 11 10  CREATININE 0.86 0.67  CALCIUM 9.0 8.3*   PT/INR Recent Labs    07/06/19 1832 07/07/19 0425  LABPROT 25.2* 19.2*  INR 2.4* 1.7*   ABG No results for input(s): PHART, HCO3 in the last 72 hours.  Invalid input(s): PCO2, PO2  Studies/Results: US Abdomen Limited RUQ  Result Date: 07/06/2019 CLINICAL DATA:  Right upper quadrant pain EXAM: ULTRASOUND ABDOMEN LIMITED RIGHT UPPER QUADRANT COMPARISON:  Ultrasound 07/29/2016, CTa chest 01/11/2017 FINDINGS: Gallbladder: Mild gallbladder distension with the gallbladder measuring up to 11 cm in length. Gallbladder contains some echogenic biliary sludge layering dependently. No visible calcified gallstones. Previously seen gallbladder polyps are not visualized on this exam. Borderline gallbladder wall thickening to 3.3 mm. Sonographic Eulah Pont sign is reportedly negative however patient was pre-medicated prior to this examination. Common bile duct: Diameter: 7.2 mm, borderline dilated. Liver: No focal lesion identified. Within  normal limits in parenchymal echogenicity. Previously seen hepatic cyst is not visualized on this exam. Portal vein is patent on color Doppler imaging with normal direction of blood flow towards the liver. Other: None. IMPRESSION: Small amount of biliary sludge with mild gallbladder wall thickening and distention. Patient was pre-medicated prior to the examination limiting clinical utility of a sonographic Murphy sign. Findings could reflect acute cholecystitis in the appropriate clinical setting. Borderline dilatation of the common bile duct at 7.2 mm. Recommend correlation with liver serologies. If there is persisting concern for a choledocholithiasis, MRCP could be obtained. Electronically Signed   By: Kreg Shropshire M.D.   On: 07/06/2019 19:36    Anti-infectives: Anti-infectives (From admission, onward)   Start     Dose/Rate Route Frequency Ordered Stop   07/07/19 2000  cefTRIAXone (ROCEPHIN) 2 g in sodium chloride 0.9 % 100 mL IVPB     Discontinue     2 g 200 mL/hr over 30 Minutes Intravenous Every 24 hours 07/06/19 2126     07/06/19 2200  metroNIDAZOLE (FLAGYL) IVPB 500 mg     Discontinue     500 mg 100 mL/hr over 60 Minutes Intravenous Every 8 hours 07/06/19 2126     07/06/19 2045  cefTRIAXone (ROCEPHIN) 2 g in sodium chloride 0.9 % 100 mL IVPB        2 g 200 mL/hr over 30 Minutes Intravenous  Once 07/06/19 2032 07/06/19 2200      Assessment/Plan: s/p Procedure(s): LAPAROSCOPIC CHOLECYSTECTOMY WITH INTRAOPERATIVE CHOLANGIOGRAM (N/A) plan for lap chole with ioc today. Risks and benefits of the surgery as well as  some of the technical aspects discussed with pt including cbd injury and she understands and wishes to proceed  LOS: 1 day    Melinda Benton 07/07/2019

## 2019-07-07 NOTE — Anesthesia Preprocedure Evaluation (Addendum)
Anesthesia Evaluation  Patient identified by MRN, date of birth, ID band Patient awake    Reviewed: Allergy & Precautions, NPO status , Patient's Chart, lab work & pertinent test results  Airway Mallampati: I  TM Distance: >3 FB Neck ROM: Full    Dental no notable dental hx. (+) Teeth Intact, Dental Advisory Given   Pulmonary neg pulmonary ROS,    Pulmonary exam normal breath sounds clear to auscultation       Cardiovascular negative cardio ROS Normal cardiovascular exam Rhythm:Regular Rate:Normal  TTE 2019 - Left ventricle: The cavity size was normal. Wall thickness was normal. Systolic function was normal. The estimated ejection fraction was in the range of 60% to 65%. Wall motion was normal;there were no regional wall motion abnormalities. Dopplerparameters are consistent with abnormal left ventricular relaxation (grade 1 diastolic dysfunction). The E/e&' ratio isbetween 8-15, suggesting indeterminate LV filling pressure.  - Left atrium: The atrium was normal in size.  - Right atrium: The atrium was normal in size.  - Tricuspid valve: There was trivial regurgitation.  - Pulmonary arteries: PA peak pressure: 19 mm Hg (S).  - Inferior vena cava: The vessel was normal in size. The respirophasic diameter changes were in the normal range (= 50%), consistent with normal central venous pressure.  - Pericardium, extracardiac: A trivial pericardial effusion was identified posterior to the heart. Features were not consistent with tamponade physiology.   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), ,   Anesthesia Other Findings INR 1.7, plan to give 2U FFP intraop per surgeon  request  Reproductive/Obstetrics                             Anesthesia Physical Anesthesia Plan  ASA: II  Anesthesia Plan: General   Post-op Pain Management:    Induction: Intravenous  PONV Risk Score and Plan: 3 and Midazolam, Dexamethasone and Ondansetron  Airway Management Planned: Oral ETT  Additional Equipment:   Intra-op Plan:   Post-operative Plan: Extubation in OR  Informed Consent: I have reviewed the patients History and Physical, chart, labs and discussed the procedure including the risks, benefits and alternatives for the proposed anesthesia with the patient or authorized representative who has indicated his/her understanding and acceptance.     Dental advisory given  Plan Discussed with: CRNA  Anesthesia Plan Comments:         Anesthesia Quick Evaluation

## 2019-07-07 NOTE — Op Note (Signed)
07/07/2019  11:24 AM  PATIENT:  Melinda Benton  59 y.o. female  PRE-OPERATIVE DIAGNOSIS:  cholecystitis  POST-OPERATIVE DIAGNOSIS:  cholecystitis  PROCEDURE:  Procedure(s): LAPAROSCOPIC CHOLECYSTECTOMY WITH INTRAOPERATIVE CHOLANGIOGRAM (N/A)  SURGEON:  Surgeon(s) and Role:    * Griselda Miner, MD - Primary  PHYSICIAN ASSISTANT:   ASSISTANTS: none   ANESTHESIA:   local and general  EBL:  15 mL   BLOOD ADMINISTERED:none  DRAINS: none   LOCAL MEDICATIONS USED:  MARCAINE     SPECIMEN:  Source of Specimen:  gallbladder  DISPOSITION OF SPECIMEN:  PATHOLOGY  COUNTS:  YES  TOURNIQUET:  * No tourniquets in log *  DICTATION: .Dragon Dictation     Procedure: After informed consent was obtained the patient was brought to the operating room and placed in the supine position on the operating room table. After adequate induction of general anesthesia the patient's abdomen was prepped with ChloraPrep allowed to dry and draped in usual sterile manner. An appropriate timeout was performed. The area below the umbilicus was infiltrated with quarter percent  Marcaine. A small incision was made with a 15 blade knife. The incision was carried down through the subcutaneous tissue bluntly with a hemostat and [employer] retractors. The linea alba was identified. The linea alba was incised with a 15 blade knife and each side was grasped with Coker clamps. The preperitoneal space was then probed with a hemostat until the peritoneum was opened and access was gained to the abdominal cavity. A 0 Vicryl pursestring stitch was placed in the fascia surrounding the opening. A Hassan cannula was then placed through the opening and anchored in place with the previously placed Vicryl purse string stitch. The abdomen was insufflated with carbon dioxide without difficulty. A laparoscope was inserted through the [facility] cannula in the right upper quadrant was inspected. Next the epigastric region was infiltrated  with % Marcaine. A small incision was made with a 15 blade knife. A 5 mm port was placed bluntly through this incision into the abdominal cavity under direct vision. Next 2 sites were chosen laterally on the right side of the abdomen for placement of 5 mm ports. Each of these areas was infiltrated with quarter percent Marcaine. Small stab incisions were made with a 15 blade knife. 5 mm ports were then placed bluntly through these incisions into the abdominal cavity under direct vision without difficulty. A blunt grasper was placed through the lateralmost 5 mm port and used to grasp the dome of the gallbladder and elevated anteriorly and superiorly. Another blunt grasper was placed through the other 5 mm port and used to retract the body and neck of the gallbladder. A dissector was placed through the epigastric port and using the electrocautery the peritoneal reflection at the gallbladder neck was opened. Blunt dissection was then carried out in this area until the gallbladder neck-cystic duct junction was readily identified and a good window was created. A single clip was placed on the gallbladder neck. A small  ductotomy was made just below the clip with laparoscopic scissors. A 14-gauge Angiocath was then placed through the anterior abdominal wall under direct vision. A Reddick cholangiogram catheter was then placed through the Angiocath and flushed. The catheter was then placed in the cystic duct and anchored in place with a clip. A cholangiogram was obtained that showed no filling defects good emptying into the duodenum an adequate length on the cystic duct. The anchoring clip and catheters were then removed from the patient. 3 clips  were placed proximally on the cystic duct and the duct was divided between the 2 sets of clips. Posterior to this the cystic artery was identified and again dissected bluntly in a circumferential manner until a good window  was created. 2 clips were placed proximally and one  distally on the artery and the artery was divided between the 2 sets of clips. Next a laparoscopic hook cautery device was used to separate the gallbladder from the liver bed. Prior to completely detaching the gallbladder from the liver bed the liver bed was inspected and several small bleeding points were coagulated with the electrocautery until the area was completely hemostatic. The gallbladder was then detached the rest of it from the liver bed without difficulty. A laparoscopic bag was inserted through the hassan port. The laparoscope was moved to the epigastric port. The gallbladder was placed within the bag and the bag was sealed.  The bag with the gallbladder was then removed with the [facility] cannula through the infraumbilical port without difficulty. The fascial defect was then closed with the previously placed Vicryl pursestring stitch as well as with another figure-of-eight 0 Vicryl stitch. The liver bed was inspected again and found to be hemostatic. The abdomen was irrigated with copious amounts of saline until the effluent was clear. The ports were then removed under direct vision without difficulty and were found to be hemostatic. The gas was allowed to escape. The skin incisions were all closed with interrupted 4-0 Monocryl subcuticular stitches. Dermabond dressings were applied. The patient tolerated the procedure well. At the end of the case all needle sponge and instrument counts were correct. The patient was then awakened and taken to recovery in stable condition  PLAN OF CARE: Admit for overnight observation  PATIENT DISPOSITION:  PACU - hemodynamically stable.   Delay start of Pharmacological VTE agent (>24hrs) due to surgical blood loss or risk of bleeding: no

## 2019-07-08 ENCOUNTER — Encounter (HOSPITAL_COMMUNITY): Payer: Self-pay | Admitting: General Surgery

## 2019-07-08 LAB — PREPARE FRESH FROZEN PLASMA: Unit division: 0

## 2019-07-08 LAB — CBC
HCT: 35.5 % — ABNORMAL LOW (ref 36.0–46.0)
Hemoglobin: 11.4 g/dL — ABNORMAL LOW (ref 12.0–15.0)
MCH: 29.8 pg (ref 26.0–34.0)
MCHC: 32.1 g/dL (ref 30.0–36.0)
MCV: 92.9 fL (ref 80.0–100.0)
Platelets: 167 10*3/uL (ref 150–400)
RBC: 3.82 MIL/uL — ABNORMAL LOW (ref 3.87–5.11)
RDW: 11.9 % (ref 11.5–15.5)
WBC: 10.5 10*3/uL (ref 4.0–10.5)
nRBC: 0 % (ref 0.0–0.2)

## 2019-07-08 LAB — COMPREHENSIVE METABOLIC PANEL
ALT: 21 U/L (ref 0–44)
AST: 24 U/L (ref 15–41)
Albumin: 3.4 g/dL — ABNORMAL LOW (ref 3.5–5.0)
Alkaline Phosphatase: 50 U/L (ref 38–126)
Anion gap: 9 (ref 5–15)
BUN: 14 mg/dL (ref 6–20)
CO2: 26 mmol/L (ref 22–32)
Calcium: 8.4 mg/dL — ABNORMAL LOW (ref 8.9–10.3)
Chloride: 102 mmol/L (ref 98–111)
Creatinine, Ser: 0.81 mg/dL (ref 0.44–1.00)
GFR calc Af Amer: >60 mL/min (ref >60)
GFR calc non Af Amer: >60 mL/min (ref >60)
Glucose, Bld: 165 mg/dL — ABNORMAL HIGH (ref 70–99)
Potassium: 3.9 mmol/L (ref 3.5–5.1)
Sodium: 137 mmol/L (ref 135–145)
Total Bilirubin: 0.4 mg/dL (ref 0.3–1.2)
Total Protein: 6.3 g/dL — ABNORMAL LOW (ref 6.5–8.1)

## 2019-07-08 LAB — BPAM FFP
Blood Product Expiration Date: 202107092359
Blood Product Expiration Date: 202107092359
ISSUE DATE / TIME: 202107040915
ISSUE DATE / TIME: 202107040915
Unit Type and Rh: 5100
Unit Type and Rh: 9500

## 2019-07-08 LAB — PHOSPHORUS: Phosphorus: 2.3 mg/dL — ABNORMAL LOW (ref 2.5–4.6)

## 2019-07-08 LAB — MAGNESIUM: Magnesium: 2.3 mg/dL (ref 1.7–2.4)

## 2019-07-08 MED ORDER — ENOXAPARIN SODIUM 40 MG/0.4ML ~~LOC~~ SOLN
40.0000 mg | SUBCUTANEOUS | Status: DC
  Administered 2019-07-08: 40 mg via SUBCUTANEOUS
  Filled 2019-07-08: qty 0.4

## 2019-07-08 MED ORDER — K PHOS MONO-SOD PHOS DI & MONO 155-852-130 MG PO TABS: 250.0000 mg | ORAL_TABLET | Freq: Two times a day (BID) | ORAL | Status: DC

## 2019-07-08 MED ORDER — WARFARIN - PHARMACIST DOSING INPATIENT: Freq: Every day | Status: DC

## 2019-07-08 MED ORDER — K PHOS MONO-SOD PHOS DI & MONO 155-852-130 MG PO TABS
250.0000 mg | ORAL_TABLET | Freq: Two times a day (BID) | ORAL | Status: DC
  Administered 2019-07-08: 250 mg via ORAL
  Filled 2019-07-08: qty 1

## 2019-07-08 MED ORDER — WARFARIN SODIUM 6 MG PO TABS
6.0000 mg | ORAL_TABLET | Freq: Once | ORAL | Status: DC
  Filled 2019-07-08: qty 1

## 2019-07-08 MED ORDER — ENOXAPARIN SODIUM 40 MG/0.4ML ~~LOC~~ SOLN: 100.0000 mg | SUBCUTANEOUS | 0 refills | Status: DC

## 2019-07-08 MED ORDER — HYDROCODONE-ACETAMINOPHEN 5-325 MG PO TABS: 1.0000 | ORAL_TABLET | ORAL | 0 refills | Status: DC | PRN

## 2019-07-08 NOTE — Progress Notes (Addendum)
ANTICOAGULATION CONSULT NOTE - Initial Consult  Pharmacy Consult for coumadin & LMWH Indication: protein S defeciency  No Known Allergies  Patient Measurements: Height: 5\' 9"  (175.3 cm) IBW/kg (Calculated) : 66.2 Heparin Dosing Weight: 73.9 kg   Vital Signs: Temp: 98.9 F (37.2 C) (07/05 0609) Temp Source: Oral (07/05 0609) BP: 117/65 (07/05 0609) Pulse Rate: 64 (07/05 0609)  Labs: Recent Labs    07/06/19 1734 07/06/19 1734 07/06/19 1832 07/07/19 0425 07/08/19 0432  HGB 14.1   < >  --  13.3 11.4*  HCT 42.5  --   --  40.5 35.5*  PLT 192  --   --  186 167  APTT  --   --  55*  --   --   LABPROT  --   --  25.2* 19.2*  --   INR  --   --  2.4* 1.7*  --   CREATININE 0.86  --   --  0.67 0.81   < > = values in this interval not displayed.    CrCl cannot be calculated (Unknown ideal weight.).   Medical History: Past Medical History:  Diagnosis Date  . Arthritis   . Clotting disorder (HCC)   . H/O blood clots   . History of chicken pox   . Migraines   . Mitral valve prolapse   . Thyroid disease     Medications:  Medications Prior to Admission  Medication Sig Dispense Refill Last Dose  . buPROPion (WELLBUTRIN XL) 300 MG 24 hr tablet Take 1 tablet (300 mg total) by mouth daily. Need Office Visit for additional refills 90 tablet 0 07/06/2019 at Unknown time  . hydrocortisone 2.5 % cream Apply topically 2 (two) times daily. (Patient taking differently: Apply 1 application topically as needed (poison ivy). ) 30 g 0 unknown  . levothyroxine (SYNTHROID, LEVOTHROID) 88 MCG tablet Take 88 mcg by mouth daily before breakfast.   07/06/2019 at Unknown time  . metoprolol succinate (TOPROL-XL) 25 MG 24 hr tablet Take 1 tablet (25 mg total) by mouth daily as needed. (Patient taking differently: Take 25 mg by mouth as needed (heart). ) 30 tablet 5 unknown  . pantoprazole (PROTONIX) 20 MG tablet Take 1 tablet (20 mg total) by mouth daily. (Patient taking differently: Take 20 mg by mouth as  needed for heartburn (acid reflux). ) 30 tablet 5 07/05/2019 at Unknown time  . warfarin (COUMADIN) 6 MG tablet TAKE AS DIRECTED BY ANTICOAGULATION CLINIC (Patient taking differently: Take 6 mg by mouth daily. TAKE AS DIRECTED BY ANTICOAGULATION CLINIC.) 48 tablet 0 07/06/2019 at 0900  . calamine lotion Apply 1 application topically as needed for itching. (Patient not taking: Reported on 07/06/2019) 120 mL 0 Not Taking at Unknown time  . Melatonin 5 MG TABS Take 1-2 tablets (5-10 mg total) by mouth at bedtime as needed. (Patient not taking: Reported on 07/06/2019)  0 Not Taking at Unknown time    Assessment: 59 yo F on coumadin 6 mg po daily PTA for protein S deficiency. S/p lap cholecystectomy 7/4> OK to resume coumadin today. INR 2.4 on admit> given vitamin K 5 mg IV x 1 0004 7/4> INR 1.7 0242 7/4.   Also give 2 U FFP 7/4. No INR today> expect to be low.  Hg 11.4/PLTC 167.  No bleeding reported.  PTA coumadin dose: 6 mg daily, LD 7.3 @ 9am. Drug interaction: flagyl & coumadin: can increase INR  Goal of Therapy:  INR 2-3 Monitor platelets by anticoagulation protocol: Yes  Plan:  LMWH 40 sq q24 for VTE px dose (full bridging dose would be 70q12 or 100 q24) Coumadin 6 mg po x 1 dose today (home dose) Daily INR  Herby Abraham, Pharm.D 07/08/2019 8:06 AM

## 2019-07-08 NOTE — Progress Notes (Signed)
1 Day Post-Op   Subjective/Chief Complaint: Complains only of some mild soreness.  Otherwise feels better.   Objective: Vital signs in last 24 hours: Temp:  [98.4 F (36.9 C)-100.1 F (37.8 C)] 98.9 F (37.2 C) (07/05 0609) Pulse Rate:  [64-86] 64 (07/05 0609) Resp:  [13-23] 16 (07/05 0609) BP: (107-141)/(54-75) 117/65 (07/05 0609) SpO2:  [93 %-100 %] 96 % (07/05 0609)    Intake/Output from previous day: 07/04 0701 - 07/05 0700 In: 1210 [I.V.:650; Blood:560] Out: 15 [Blood:15] Intake/Output this shift: No intake/output data recorded.  General appearance: alert and cooperative Resp: clear to auscultation bilaterally Cardio: regular rate and rhythm GI: Soft, mild soreness.  Incisions look good.  Lab Results:  Recent Labs    07/07/19 0425 07/08/19 0432  WBC 11.7* 10.5  HGB 13.3 11.4*  HCT 40.5 35.5*  PLT 186 167   BMET Recent Labs    07/07/19 0425 07/08/19 0432  NA 139 137  K 3.6 3.9  CL 104 102  CO2 25 26  GLUCOSE 108* 165*  BUN 10 14  CREATININE 0.67 0.81  CALCIUM 8.3* 8.4*   PT/INR Recent Labs    07/06/19 1832 07/07/19 0425  LABPROT 25.2* 19.2*  INR 2.4* 1.7*   ABG No results for input(s): PHART, HCO3 in the last 72 hours.  Invalid input(s): PCO2, PO2  Studies/Results: DG Cholangiogram Operative  Result Date: 07/07/2019 CLINICAL DATA:  59 year old female with a history of cholelithiasis EXAM: INTRAOPERATIVE CHOLANGIOGRAM TECHNIQUE: Cholangiographic images from the C-arm fluoroscopic device were submitted for interpretation post-operatively. Please see the procedural report for the amount of contrast and the fluoroscopy time utilized. COMPARISON:  None. FINDINGS: Surgical instruments project over the upper abdomen. There is cannulation of the cystic duct/gallbladder neck, with antegrade infusion of contrast. Caliber of the extrahepatic ductal system within normal limits. No definite filling defect within the extrahepatic ducts identified. Free flow  of contrast across the ampulla. IMPRESSION: Intraoperative cholangiogram demonstrates extrahepatic biliary ducts of unremarkable caliber, with no definite filling defects identified. Free flow of contrast across the ampulla. Please refer to the dictated operative report for full details of intraoperative findings and procedure Electronically Signed   By: Gilmer Mor D.O.   On: 07/07/2019 12:17   US Abdomen Limited RUQ  Result Date: 07/06/2019 CLINICAL DATA:  Right upper quadrant pain EXAM: ULTRASOUND ABDOMEN LIMITED RIGHT UPPER QUADRANT COMPARISON:  Ultrasound 07/29/2016, CTa chest 01/11/2017 FINDINGS: Gallbladder: Mild gallbladder distension with the gallbladder measuring up to 11 cm in length. Gallbladder contains some echogenic biliary sludge layering dependently. No visible calcified gallstones. Previously seen gallbladder polyps are not visualized on this exam. Borderline gallbladder wall thickening to 3.3 mm. Sonographic Eulah Pont sign is reportedly negative however patient was pre-medicated prior to this examination. Common bile duct: Diameter: 7.2 mm, borderline dilated. Liver: No focal lesion identified. Within normal limits in parenchymal echogenicity. Previously seen hepatic cyst is not visualized on this exam. Portal vein is patent on color Doppler imaging with normal direction of blood flow towards the liver. Other: None. IMPRESSION: Small amount of biliary sludge with mild gallbladder wall thickening and distention. Patient was pre-medicated prior to the examination limiting clinical utility of a sonographic Murphy sign. Findings could reflect acute cholecystitis in the appropriate clinical setting. Borderline dilatation of the common bile duct at 7.2 mm. Recommend correlation with liver serologies. If there is persisting concern for a choledocholithiasis, MRCP could be obtained. Electronically Signed   By: Kreg Shropshire M.D.   On: 07/06/2019 19:36  Anti-infectives: Anti-infectives (From  admission, onward)   Start     Dose/Rate Route Frequency Ordered Stop   07/07/19 2000  cefTRIAXone (ROCEPHIN) 2 g in sodium chloride 0.9 % 100 mL IVPB     Discontinue     2 g 200 mL/hr over 30 Minutes Intravenous Every 24 hours 07/06/19 2126     07/06/19 2200  metroNIDAZOLE (FLAGYL) IVPB 500 mg     Discontinue     500 mg 100 mL/hr over 60 Minutes Intravenous Every 8 hours 07/06/19 2126     07/06/19 2045  cefTRIAXone (ROCEPHIN) 2 g in sodium chloride 0.9 % 100 mL IVPB        2 g 200 mL/hr over 30 Minutes Intravenous  Once 07/06/19 2032 07/06/19 2200      Assessment/Plan: s/p Procedure(s): LAPAROSCOPIC CHOLECYSTECTOMY WITH INTRAOPERATIVE CHOLANGIOGRAM (N/A) Advance diet Discharge  May begin Coumadin today.  Will ask pharmacy to manage Lovenox.  Medical service will determine if she needs to be bridged for a couple days until the Coumadin kicks in  LOS: 2 days    Melinda Benton 07/08/2019

## 2019-07-08 NOTE — Discharge Instructions (Signed)
Advised to follow-up with primary care physician in 1 week. Advised to follow-up with general surgery in 1 week. Patient has been discharged on Lovenox to bridge with Coumadin for protein S deficiency.

## 2019-07-08 NOTE — Discharge Summary (Signed)
Physician Discharge Summary  Melinda Benton XFG:[id number] DOB: 1960-06-16 DOA: 07/06/2019  PCP: Anne Ng, NP  Admit date: 07/06/2019   Discharge date: 07/08/2019  Admitted From:  Home Disposition:  Home  Recommendations for Outpatient Follow-up:  1. Follow up with PCP in 1-2 weeks 2. Please obtain BMP/CBC in one week 3. Advised to follow-up with general surgery in 1 to 2 weeks.  Home Health: None.  Equipment/Devices: None  Discharge Condition: Stable CODE STATUS:Full code Diet recommendation: Heart Healthy / Carb Modified / Regular / Dysphagia   Brief Summary/ Hospital Course: Melinda Benton a 59 y.o.femalewith medical history significant ofprotein S deficiency on Coumadin, mitral valve prolapse, hypothyroidism, GERD presenting with complaints of abdominal pain, nausea, and vomiting.Patient reports 2-day history of severe right upper quadrant abdominal pain, nausea, and vomiting. States she was out of town at [employer] and went to the local hospital there. States they did an ultrasound and told her her gallbladder was abnormal and recommended surgery to remove it. She did not want the surgery done at that hospital and [facility]she was hospitalized for CBD stone. Right upper quadrant ultrasound showed common bile duct stone, General surgery consulted.  She underwent laparoscopic cholecystectomy with intraoperative cholangiogram.  Tolerated well.  Since her INR was higher so she was given vitamin K,  once INR was 1.5 she underwent surgery.  Coumadin was resumed and was started on Lovenox for bridging until INR becomes therapeutic.  Patient tolerated clear liquid diet,  advanced to full liquids.  Patient was cleared from general surgery to be discharged and patient wants to go home.  Discussed with pharmacy,  Patient will follow-up with Coumadin clinic tomorrow and patient will continue Lovenox until INR becomes therapeutic.  Lovenox was sent to the  pharmacy.  Discharge Diagnoses:  Principal Problem:   Acute cholecystitis Active Problems:   Hypercoagulable state (HCC)   Hypothyroidism  She was managed for below problems.  Acute cholecystitis and possible choledocholithiasis:  Patient is presenting with complaints of right upper quadrant abdominal pain, nausea, and vomiting.Labs showing mild leukocytosis. No fever or signs of sepsis. Lipase 80, LFTs normal.Right upper quadrant ultrasound showing small amount of biliary sludge with mild gallbladder wall thickening and distention.Positive Murphy sign on exam. Borderline dilatation of the common bile duct at 7.2 mm.  -Underwent laparoscopic cholecystectomy with intraoperative cholangiogram. -Ceftriaxone and Flagyl given for 2 days. -Zofran as needed for nausea/vomiting -Morphine as needed for pain -Repeat labs in a.m.: CBC, CMP, INR, lipase   History of protein S deficiency:Patient is on Coumadin. INR currently 2.4.  -Hold Coumadinat this time and give IV vitamin K 5 mg once coumadin resumed and was bridged with Lovenox until INR becomes therapeutic.  Hypothyroidism -Continue Synthroid  HIV screening -The patient falls between the ages of 13-64 and should be screened for HIV, therefore HIV testing ordered.  DVT prophylaxis:SCDs Code Status:Full code Family Communication:Husband at bedside. Disposition Plan:  Patient is being discharged home and will follow up with general surgery.  Discharge Instructions  Discharge Instructions    Call MD for:  difficulty breathing, headache or visual disturbances   Complete by: As directed    Call MD for:  persistant dizziness or light-headedness   Complete by: As directed    Call MD for:  persistant nausea and vomiting   Complete by: As directed    Call MD for:  temperature >100.4   Complete by: As directed    Diet - low sodium heart healthy  Complete by: As directed    Discharge instructions   Complete by: As  directed    Follow-up with primary care physician in 1 week. Advised to follow-up with general surgery in 1 to 2 weeks. Advised to follow-up with Coumadin clinic. She has been discharged on Lovenox 100 mg subcu for bridging with Coumadin for protein S deficiency   Increase activity slowly   Complete by: As directed      Allergies as of 07/08/2019   No Known Allergies     Medication List    TAKE these medications   buPROPion 300 MG 24 hr tablet Commonly known as: WELLBUTRIN XL Take 1 tablet (300 mg total) by mouth daily. Need Office Visit for additional refills   calamine lotion Apply 1 application topically as needed for itching.   enoxaparin 40 MG/0.4ML injection Commonly known as: LOVENOX Inject 1 mL (100 mg total) into the skin daily for 7 days. Start taking on: July 09, 2019   HYDROcodone-acetaminophen 5-325 MG tablet Commonly known as: NORCO/VICODIN Take 1 tablet by mouth every 4 (four) hours as needed for moderate pain.   hydrocortisone 2.5 % cream Apply topically 2 (two) times daily. What changed:   how much to take  when to take this  reasons to take this   levothyroxine 88 MCG tablet Commonly known as: SYNTHROID Take 88 mcg by mouth daily before breakfast.   melatonin 5 MG Tabs Take 1-2 tablets (5-10 mg total) by mouth at bedtime as needed.   metoprolol succinate 25 MG 24 hr tablet Commonly known as: TOPROL-XL Take 1 tablet (25 mg total) by mouth daily as needed. What changed:   when to take this  reasons to take this   pantoprazole 20 MG tablet Commonly known as: Protonix Take 1 tablet (20 mg total) by mouth daily. What changed:   when to take this  reasons to take this   warfarin 6 MG tablet Commonly known as: COUMADIN Take as directed. If you are unsure how to take this medication, talk to your nurse or doctor. Original instructions: TAKE AS DIRECTED BY ANTICOAGULATION CLINIC What changed: See the new instructions.       Follow-up  Information    Nche, Melinda Gains, NP Follow up in 1 week(s).   Specialty: Internal Medicine Contact information: [address] [place] [address] [phone]        Chevis Pretty III, MD Follow up in 1 week(s).   Specialty: General Surgery Contact information: [address] [place] [address] [phone]              No Known Allergies  Consultations:  Surgery   Procedures/Studies: DG Cholangiogram Operative  Result Date: 07/07/2019 CLINICAL DATA:  59 year old female with a history of cholelithiasis EXAM: INTRAOPERATIVE CHOLANGIOGRAM TECHNIQUE: Cholangiographic images from the C-arm fluoroscopic device were submitted for interpretation post-operatively. Please see the procedural report for the amount of contrast and the fluoroscopy time utilized. COMPARISON:  None. FINDINGS: Surgical instruments project over the upper abdomen. There is cannulation of the cystic duct/gallbladder neck, with antegrade infusion of contrast. Caliber of the extrahepatic ductal system within normal limits. No definite filling defect within the extrahepatic ducts identified. Free flow of contrast across the ampulla. IMPRESSION: Intraoperative cholangiogram demonstrates extrahepatic biliary ducts of unremarkable caliber, with no definite filling defects identified. Free flow of contrast across the ampulla. Please refer to the dictated operative report for full details of intraoperative findings and procedure Electronically Signed   By:  Gilmer MorJaime  Wagner D.O.   On: 07/07/2019 12:17   US Abdomen Limited RUQ  Result Date: 07/06/2019 CLINICAL DATA:  Right upper quadrant pain EXAM: ULTRASOUND ABDOMEN LIMITED RIGHT UPPER QUADRANT COMPARISON:  Ultrasound 07/29/2016, CTa chest 01/11/2017 FINDINGS: Gallbladder: Mild gallbladder distension with the gallbladder measuring up to 11 cm in length. Gallbladder contains some echogenic biliary sludge layering dependently. No visible calcified  gallstones. Previously seen gallbladder polyps are not visualized on this exam. Borderline gallbladder wall thickening to 3.3 mm. Sonographic Eulah PontMurphy sign is reportedly negative however patient was pre-medicated prior to this examination. Common bile duct: Diameter: 7.2 mm, borderline dilated. Liver: No focal lesion identified. Within normal limits in parenchymal echogenicity. Previously seen hepatic cyst is not visualized on this exam. Portal vein is patent on color Doppler imaging with normal direction of blood flow towards the liver. Other: None. IMPRESSION: Small amount of biliary sludge with mild gallbladder wall thickening and distention. Patient was pre-medicated prior to the examination limiting clinical utility of a sonographic Murphy sign. Findings could reflect acute cholecystitis in the appropriate clinical setting. Borderline dilatation of the common bile duct at 7.2 mm. Recommend correlation with liver serologies. If there is persisting concern for a choledocholithiasis, MRCP could be obtained. Electronically Signed   By: Kreg ShropshirePrice  DeHay M.D.   On: 07/06/2019 19:36    Laparoscopic cholecystectomy with intraoperative cholangiogram.   Subjective: Patient was seen and examined at bedside.  No overnight events.  She denies any nausea vomiting and diarrhea.  She reports abdominal pain is better.  And she wants to be discharged home.  Discharge Exam: Vitals:   07/08/19 0607 07/08/19 0609  BP: 117/65 117/65  Pulse: 64 64  Resp: 16 16  Temp: 98.9 F (37.2 C) 98.9 F (37.2 C)  SpO2: 96% 96%   Vitals:   07/07/19 2011 07/08/19 0046 07/08/19 0607 07/08/19 0609  BP: (!) 107/54 115/61 117/65 117/65  Pulse: 78 67 64 64  Resp: 17 16 16 16   Temp: 98.6 F (37 C) 99 F (37.2 C) 98.9 F (37.2 C) 98.9 F (37.2 C)  TempSrc: Oral Oral Oral Oral  SpO2: 93% 93% 96% 96%  Height:        General: Pt is alert, awake, not in acute distress Cardiovascular: RRR, S1/S2 +, no rubs, no  gallops Respiratory: CTA bilaterally, no wheezing, no rhonchi Abdominal: Soft, NT, ND, bowel sounds + Extremities: no edema, no cyanosis    The results of significant diagnostics from this hospitalization (including imaging, microbiology, ancillary and laboratory) are listed below for reference.     Microbiology: Recent Results (from the past 240 hour(s))  SARS Coronavirus 2 by RT PCR (hospital order, performed in [facility] hospital lab) Nasopharyngeal Nasopharyngeal Swab     Status: None   Collection Time: 07/06/19  6:49 PM   Specimen: Nasopharyngeal Swab  Result Value Ref Range Status   SARS Coronavirus 2 NEGATIVE NEGATIVE Final    Comment: (NOTE) SARS-CoV-2 target nucleic acids are NOT DETECTED.  The SARS-CoV-2 RNA is generally detectable in upper and lower respiratory specimens during the acute phase of infection. The lowest concentration of SARS-CoV-2 viral copies this assay can detect is 250 copies / mL. A negative result does not preclude SARS-CoV-2 infection and should not be used as the sole basis for treatment or other patient management decisions.  A negative result may occur with improper specimen collection / handling, submission of specimen other than nasopharyngeal swab, presence of viral mutation(s) within the areas targeted by this  assay, and inadequate number of viral copies (<250 copies / mL). A negative result must be combined with clinical observations, patient history, and epidemiological information.  Fact Sheet for Patients:   BoilerBrush.com.cy  Fact Sheet for Healthcare Providers: https://pope.com/  This test is not yet approved or  cleared by the [place] FDA and has been authorized for detection and/or diagnosis of SARS-CoV-2 by FDA under an Emergency Use Authorization (EUA).  This EUA will remain in effect (meaning this test can be used) for the duration of the COVID-19 declaration under  Section 564(b)(1) of the Act, 21 U.S.C. section 360bbb-3(b)(1), unless the authorization is terminated or revoked sooner.  Performed at [facility], [address] [place], [place] [address]      Labs: BNP (last 3 results) No results for input(s): BNP in the last 8760 hours. Basic Metabolic Panel: Recent Labs  Lab 07/06/19 1734 07/07/19 0425 07/08/19 0432  NA 138 139 137  K 3.6 3.6 3.9  CL 100 104 102  CO2 28 25 26   GLUCOSE 111* 108* 165*  BUN 11 10 14   CREATININE 0.86 0.67 0.81  CALCIUM 9.0 8.3* 8.4*  MG  --   --  2.3  PHOS  --   --  2.3*   Liver Function Tests: Recent Labs  Lab 07/06/19 1734 07/07/19 0425 07/08/19 0432  AST 19 14* 24  ALT 17 13 21   ALKPHOS 57 53 50  BILITOT 1.1 1.1 0.4  PROT 7.6 6.6 6.3*  ALBUMIN 4.4 3.7 3.4*   Recent Labs  Lab 07/06/19 1734 07/07/19 0425  LIPASE 80* 67*   No results for input(s): AMMONIA in the last 168 hours. CBC: Recent Labs  Lab 07/06/19 1734 07/07/19 0425 07/08/19 0432  WBC 12.9* 11.7* 10.5  HGB 14.1 13.3 11.4*  HCT 42.5 40.5 35.5*  MCV 91.8 93.1 92.9  PLT 192 186 167   Cardiac Enzymes: No results for input(s): CKTOTAL, CKMB, CKMBINDEX, TROPONINI in the last 168 hours. BNP: Invalid input(s): POCBNP CBG: No results for input(s): GLUCAP in the last 168 hours. D-Dimer No results for input(s): DDIMER in the last 72 hours. Hgb A1c No results for input(s): HGBA1C in the last 72 hours. Lipid Profile No results for input(s): CHOL, HDL, LDLCALC, TRIG, CHOLHDL, LDLDIRECT in the last 72 hours. Thyroid function studies No results for input(s): TSH, T4TOTAL, T3FREE, THYROIDAB in the last 72 hours.  Invalid input(s): FREET3 Anemia work up No results for input(s): VITAMINB12, FOLATE, FERRITIN, TIBC, IRON, RETICCTPCT in the last 72 hours. Urinalysis No results found for: COLORURINE, APPEARANCEUR, LABSPEC, PHURINE, GLUCOSEU, HGBUR, BILIRUBINUR, KETONESUR, PROTEINUR, UROBILINOGEN, NITRITE,  LEUKOCYTESUR Sepsis Labs Invalid input(s): PROCALCITONIN,  WBC,  LACTICIDVEN Microbiology Recent Results (from the past 240 hour(s))  SARS Coronavirus 2 by RT PCR (hospital order, performed in [facility] hospital lab) Nasopharyngeal Nasopharyngeal Swab     Status: None   Collection Time: 07/06/19  6:49 PM   Specimen: Nasopharyngeal Swab  Result Value Ref Range Status   SARS Coronavirus 2 NEGATIVE NEGATIVE Final    Comment: (NOTE) SARS-CoV-2 target nucleic acids are NOT DETECTED.  The SARS-CoV-2 RNA is generally detectable in upper and lower respiratory specimens during the acute phase of infection. The lowest concentration of SARS-CoV-2 viral copies this assay can detect is 250 copies / mL. A negative result does not preclude SARS-CoV-2 infection and should not be used as the sole basis for treatment or other patient management decisions.  A negative result may occur with improper specimen  collection / handling, submission of specimen other than nasopharyngeal swab, presence of viral mutation(s) within the areas targeted by this assay, and inadequate number of viral copies (<250 copies / mL). A negative result must be combined with clinical observations, patient history, and epidemiological information.  Fact Sheet for Patients:   BoilerBrush.com.cy  Fact Sheet for Healthcare Providers: https://pope.com/  This test is not yet approved or  cleared by the [place] FDA and has been authorized for detection and/or diagnosis of SARS-CoV-2 by FDA under an Emergency Use Authorization (EUA).  This EUA will remain in effect (meaning this test can be used) for the duration of the COVID-19 declaration under Section 564(b)(1) of the Act, 21 U.S.C. section 360bbb-3(b)(1), unless the authorization is terminated or revoked sooner.  Performed at [facility], [address] [place], [place] [address]      Time  coordinating discharge: Over 30 minutes  SIGNED:   Cipriano Bunker, MD  [facility] 07/08/2019, 12:30 PM Pager   If 7PM-7AM, please contact night-coverage [email or web address]

## 2019-07-09 ENCOUNTER — Telehealth: Payer: Self-pay | Admitting: Nurse Practitioner

## 2019-07-09 ENCOUNTER — Ambulatory Visit: Payer: PRIVATE HEALTH INSURANCE

## 2019-07-09 ENCOUNTER — Other Ambulatory Visit: Payer: Self-pay | Admitting: General Practice

## 2019-07-09 DIAGNOSIS — Z7901 Long term (current) use of anticoagulants: Secondary | ICD-10-CM

## 2019-07-09 MED ORDER — WARFARIN SODIUM 6 MG PO TABS: 6.0000 mg | ORAL_TABLET | Freq: Every day | ORAL | 1 refills | Status: DC

## 2019-07-09 NOTE — Telephone Encounter (Signed)
Pt was notified and she stated she was doing better but left her vacation to drive and be admitted at [facility] and had her gallbladder removed after a gallbladder attack. She states she is healing well but they only gave her 5 hydrocodones and she has 3 left and she feels like those help her sleep. So she is asking if she could have something to help her with the pain so that she can sleep.

## 2019-07-09 NOTE — Telephone Encounter (Signed)
Patient called after hours service on Saturday, 07/06/19 at 11:42 am and stated she was having pain in RLQ; was seen in ED last night and confirmed "gallbladder issues." Calling to report increased pain, medications not helping. Currently out of town at the time and will be traveling home later that day. Also reports nausea, temp 101.2 orally one hour prior. Patient is taking dicyclomine. Patient was advised to go to ED now, another adult should drive. Patient stated she did not want to go to the hospital where she was; stated they are short staffed and does not have confidence in care; will be seen later today in ER near her home. Education was provided for need to be see within disposition time frame; verbalized understanding with no further questions.

## 2019-07-09 NOTE — Telephone Encounter (Signed)
Pt was notified and scheduled for 07/11/19 at 2:15 for hospital follow up.

## 2019-07-09 NOTE — Telephone Encounter (Signed)
Need to schedule hospital f/up (F2F). Will discuss medication during this visit.

## 2019-07-10 ENCOUNTER — Telehealth: Payer: Self-pay

## 2019-07-10 ENCOUNTER — Other Ambulatory Visit: Payer: Self-pay

## 2019-07-10 LAB — SURGICAL PATHOLOGY

## 2019-07-10 NOTE — Telephone Encounter (Signed)
Transition Care Management Follow-up Telephone Call  Date of discharge and from where: [facility]  How have you been since you were released from the hospital? Healing well, better today than yesterday  Any questions or concerns? No   Items Reviewed:  Did the pt receive and understand the discharge instructions provided? Yes   Medications obtained and verified? Yes   Any new allergies since your discharge? No   Dietary orders reviewed? Yes  Do you have support at home? Yes   Functional Questionnaire: (I = Independent and D = Dependent) ADLs: I  Bathing/Dressing- I  Meal Prep- I  Eating- I  Maintaining continence- I  Transferring/Ambulation- I  Managing Meds- I  Follow up appointments reviewed:   PCP Hospital f/u appt confirmed? Yes  Scheduled to see Melinda Benton on 07/11/2019 @ 2:15.  Specialist Hospital f/u appt confirmed? No  Patient to call to schedule  Are transportation arrangements needed? No   If their condition worsens, is the pt aware to call PCP or go to the Emergency Dept.? No  Was the patient provided with contact information for the PCP's office or ED? Yes  Was to pt encouraged to call back with questions or concerns? Yes

## 2019-07-11 ENCOUNTER — Other Ambulatory Visit: Payer: Self-pay

## 2019-07-11 ENCOUNTER — Ambulatory Visit (INDEPENDENT_AMBULATORY_CARE_PROVIDER_SITE_OTHER): Payer: PRIVATE HEALTH INSURANCE | Admitting: General Practice

## 2019-07-11 ENCOUNTER — Encounter: Payer: Self-pay | Admitting: Nurse Practitioner

## 2019-07-11 ENCOUNTER — Ambulatory Visit: Payer: PRIVATE HEALTH INSURANCE | Admitting: Nurse Practitioner

## 2019-07-11 ENCOUNTER — Other Ambulatory Visit: Payer: PRIVATE HEALTH INSURANCE

## 2019-07-11 VITALS — BP 122/62 | HR 91 | Temp 97.0°F | Ht 69.0 in | Wt 170.8 lb

## 2019-07-11 DIAGNOSIS — Z7901 Long term (current) use of anticoagulants: Secondary | ICD-10-CM

## 2019-07-11 DIAGNOSIS — Z9049 Acquired absence of other specified parts of digestive tract: Secondary | ICD-10-CM

## 2019-07-11 DIAGNOSIS — K219 Gastro-esophageal reflux disease without esophagitis: Secondary | ICD-10-CM | POA: Diagnosis not present

## 2019-07-11 DIAGNOSIS — R7989 Other specified abnormal findings of blood chemistry: Secondary | ICD-10-CM | POA: Diagnosis not present

## 2019-07-11 DIAGNOSIS — K921 Melena: Secondary | ICD-10-CM | POA: Diagnosis not present

## 2019-07-11 LAB — POCT INR: INR: 2.3 (ref 2.0–3.0)

## 2019-07-11 MED ORDER — ONDANSETRON HCL 4 MG PO TABS: 4.0000 mg | ORAL_TABLET | Freq: Three times a day (TID) | ORAL | 0 refills | Status: DC | PRN

## 2019-07-11 MED ORDER — PANTOPRAZOLE SODIUM 20 MG PO TBEC: 20.0000 mg | DELAYED_RELEASE_TABLET | Freq: Two times a day (BID) | ORAL | 0 refills | Status: DC

## 2019-07-11 NOTE — Patient Instructions (Addendum)
Pre visit review using our clinic review tool, if applicable. No additional management support is needed unless otherwise documented below in the visit note.  Pt has appointment with PCP this afternoon.

## 2019-07-11 NOTE — Progress Notes (Signed)
Medical screening examination/treatment/procedure(s) were performed by non-physician practitioner and as supervising physician I was immediately available for consultation/collaboration. I agree with above. Antaeus Karel, MD   

## 2019-07-11 NOTE — Patient Instructions (Addendum)
Go to lab for blood draw Increase pantoprazole to 20mg  BID. Use zofran for nausea as needed Advance diet to full liquid diet, then as tolerated. Add diary, meat and chicken last. Avoid high fat and spicy meals. Ok to use colace daily to prevent constipation. Maintain adequate oral hydration Schedule appt with surgeon.  Laparoscopic Cholecystectomy, Care After This sheet gives you information about how to care for yourself after your procedure. Your doctor may also give you more specific instructions. If you have problems or questions, contact your doctor. Follow these instructions at home: Care for cuts from surgery (incisions)   Follow instructions from your doctor about how to take care of your cuts from surgery. Make sure you: ? Wash your hands with soap and water before you change your bandage (dressing). If you cannot use soap and water, use hand sanitizer. ? Change your bandage as told by your doctor. ? Leave stitches (sutures), skin glue, or skin tape (adhesive) strips in place. They may need to stay in place for 2 weeks or longer. If tape strips get loose and curl up, you may trim the loose edges. Do not remove tape strips completely unless your doctor says it is okay.  Do not take baths, swim, or use a hot tub until your doctor says it is okay. Ask your doctor if you can take showers. You may only be allowed to take sponge baths for bathing.  Check your surgical cut area every day for signs of infection. Check for: ? More redness, swelling, or pain. ? More fluid or blood. ? Warmth. ? Pus or a bad smell. Activity  Do not drive or use heavy machinery while taking prescription pain medicine.  Do not lift anything that is heavier than 10 lb (4.5 kg) until your doctor says it is okay.  Do not play contact sports until your doctor says it is okay.  Do not drive for 24 hours if you were given a medicine to help you relax (sedative).  Rest as needed. Do not return to work or  school until your doctor says it is okay. General instructions  Take over-the-counter and prescription medicines only as told by your doctor.  To prevent or treat constipation while you are taking prescription pain medicine, your doctor may recommend that you: ? Drink enough fluid to keep your pee (urine) clear or pale yellow. ? Take over-the-counter or prescription medicines. ? Eat foods that are high in fiber, such as fresh fruits and vegetables, whole grains, and beans. ? Limit foods that are high in fat and processed sugars, such as fried and sweet foods. Contact a doctor if:  You develop a rash.  You have more redness, swelling, or pain around your surgical cuts.  You have more fluid or blood coming from your surgical cuts.  Your surgical cuts feel warm to the touch.  You have pus or a bad smell coming from your surgical cuts.  You have a fever.  One or more of your surgical cuts breaks open. Get help right away if:  You have trouble breathing.  You have chest pain.  You have pain that is getting worse in your shoulders.  You faint or feel dizzy when you stand.  You have very bad pain in your belly (abdomen).  You are sick to your stomach (nauseous) for more than one day.  You have throwing up (vomiting) that lasts for more than one day.  You have leg pain. This information is not intended to  replace advice given to you by your health care provider. Make sure you discuss any questions you have with your health care provider. Document Revised: 12/02/2016 Document Reviewed: 06/08/2015 Elsevier Patient Education  2020 [employer]   Full Liquid Diet A full liquid diet refers to fluids and foods that are liquid or will become liquid at room temperature. This diet should only be used for a short period of time to help you recover from illness or surgery. Your health care provider or dietitian will help you determine when it is safe to eat regular foods. What are  tips for following this plan?     Reading food labels  Check food labels of nutrition shakes for the amount of protein. Look for nutrition shakes that have at least 8-10 grams of protein in each serving.  Look for drinks, such as milks and juices, that are "fortified" or "enriched." This means that vitamins and minerals have been added. Shopping  Buy premade nutritional shakes to keep on hand.  To vary your choices, buy different flavors of milks and shakes. Meal planning  Choose flavors and foods that you enjoy.  To make sure you get enough energy from food (calories): ? Eat 3 full liquid meals each day. Have a liquid snack between each meal. ? Drink 6-8 ounces (177-237 ml) of a nutrition supplement shake with meals or as snacks. ? Add protein powder, powdered milk, milk, or yogurt to shakes to increase the amount of protein.  Drink at least one serving a day of citrus fruit juice or fruit juice that has vitamin C added. General guidelines  Before starting the full-liquid diet, check with your health care provider to know what foods you should avoid. These may include full-fat or high-fiber liquids.  You may have any liquid or food that becomes a liquid at room temperature. The food is considered a liquid if it can be poured off a spoon at room temperature.  Do not drink alcohol unless approved by your health care provider.  This diet gives you most of the nutrients that you need for energy, but you may not get enough of certain vitamins, minerals, and fiber. Make sure to talk to your health care provider or dietitian about: ? How many calories you need to eat get day. ? How much fluid you should have each day. ? Taking a multivitamin or a nutritional supplement. What foods are allowed? The items listed may not be a complete list. Talk with your dietitian about what dietary choices are best for you. Grains Thin hot cereal, such as cream of wheat. Soft-cooked pasta or rice  pured in soup. Vegetables Pulp-free tomato or vegetable juice. Vegetables pured in soup. Fruits Fruit juice without pulp. Strained fruit pures (seeds and skins removed). Meats and other protein foods Beef, chicken, and fish broths. Powdered protein supplements. Dairy Milk and milk-based beverages, including milk shakes and instant breakfast mixes. Smooth yogurt. Pured cottage cheese. Beverages Water. Coffee and tea (caffeinated or decaffeinated). Cocoa. Liquid nutritional supplements. Soft drinks. Nondairy milks, such as almond, coconut, rice, or soy milk. Fats and oils Melted margarine and butter. Cream. [place], almond, avocado, corn, grapeseed, sunflower, and sesame oils. Gravy. Sweets and desserts Custard. Pudding. Flavored gelatin. Smooth ice cream (without nuts or candy pieces). Sherbet. Popsicles. [place] ice. Pudding pops. Seasoning and other foods Salt and pepper. Spices. Cocoa powder. Vinegar. Ketchup. Yellow mustard. Smooth sauces, such as [place], cheese sauce, or white sauce. Soy sauce. Cream soups. Strained soups. Syrup. Honey.  Jelly (without fruit pieces). What foods are not allowed? The items listed may not be a complete list. Talk with your dietitian about what dietary choices are best for you. Grains Whole grains. Pasta. Rice. Cold cereal. Bread. Crackers. Vegetables All whole fresh, frozen, or canned vegetables. Fruits All whole fresh, frozen, or canned fruits. Meats and other protein foods All cuts of meat, poultry, and fish. Eggs. Tofu and soy protein. Nuts and nut butters. Lunch meat. Sausage. Dairy Hard cheese. Yogurt with fruit chunks. Fats and oils Coconut oil. Palm oil. Lard. Cold butter. Sweets and desserts Ice cream or other frozen desserts that have any solids in them or on top, such as nuts, chocolate chips, and pieces of cookies. Cakes. Cookies. Candy. Seasoning and other foods Stone-ground mustards. Soups with chunks or pieces. Summary  A  full liquid diet refers to fluids and foods that are liquid or will become liquid at room temperature.  This diet should only be used for a short period of time to help you recover from illness or surgery. Ask your health care provider or dietitian when it is safe for you to eat regular foods.  To make sure you get enough calories and nutrients, eat 3 meals each day with snacks between. Drink premade nutrition supplement shakes or add protein powder to homemade shakes. Take a vitamin and mineral supplement as told by your health care provider. This information is not intended to replace advice given to you by your health care provider. Make sure you discuss any questions you have with your health care provider. Document Revised: 03/18/2017 Document Reviewed: 02/03/2016 Elsevier Patient Education  2020 [employer].

## 2019-07-11 NOTE — Progress Notes (Signed)
Subjective:  Patient ID: Melinda Benton, female    DOB: 1960-09-10  Age: 59 y.o. MRN: [id number]  CC: Hospitalization Follow-up (today feels bad an tired and weak//pt reports being dizzy and nauseous//yesterday she felt really good but today not so much//no recent colonoscopy//no tetanus today)  HPI Hospitalization: 7/3 to 07/08/2019 S/p lap. cholecystectomy. She reports persistent nausea, loss of appetite and generalized weakness. Only consuming watermelon and water. She is still to schedule appt with surgeon. Last BM yesterday, describes as pellets and black color. She thinks this might be related to peptobimol taken 1week ago.  Elevated LFTs S/p lap. cholecystectomy. She reports persistent nausea, loss of appetite and generalized weakness. Only consuming watermelon and water. She is still to schedule appt with surgeon. Last BM yesterday, describes as pellets and black color. She thinks this might be related to peptobimol taken 1week ago.  Repeat CBC CMP Lipase Increase pantoprazole to 20mg  BID Return IFOB card to lab.   Reviewed past Medical, Social and Family history today.  Outpatient Medications Prior to Visit  Medication Sig Dispense Refill  . buPROPion (WELLBUTRIN XL) 300 MG 24 hr tablet Take 1 tablet (300 mg total) by mouth daily. Need Office Visit for additional refills 90 tablet 0  . levothyroxine (SYNTHROID, LEVOTHROID) 88 MCG tablet Take 88 mcg by mouth daily before breakfast.    . metoprolol succinate (TOPROL-XL) 25 MG 24 hr tablet Take 1 tablet (25 mg total) by mouth daily as needed. (Patient not taking: Reported on 07/12/2019) 30 tablet 5  . warfarin (COUMADIN) 6 MG tablet Take 1 tablet (6 mg total) by mouth daily. TAKE AS DIRECTED BY ANTICOAGULATION CLINIC. 90 tablet 1  . HYDROcodone-acetaminophen (NORCO/VICODIN) 5-325 MG tablet Take 1 tablet by mouth every 4 (four) hours as needed for moderate pain. 5 tablet 0  . hydrocortisone 2.5 % cream Apply topically 2 (two)  times daily. (Patient not taking: Reported on 07/12/2019) 30 g 0  . Melatonin 5 MG TABS Take 1-2 tablets (5-10 mg total) by mouth at bedtime as needed. (Patient not taking: Reported on 07/12/2019)  0  . pantoprazole (PROTONIX) 20 MG tablet Take 1 tablet (20 mg total) by mouth daily. (Patient taking differently: Take 20 mg by mouth as needed for heartburn (acid reflux). ) 30 tablet 5  . calamine lotion Apply 1 application topically as needed for itching. (Patient not taking: Reported on 07/06/2019) 120 mL 0  . enoxaparin (LOVENOX) 40 MG/0.4ML injection Inject 1 mL (100 mg total) into the skin daily for 7 days. (Patient not taking: Reported on 07/11/2019) 7 mL 0   No facility-administered medications prior to visit.    ROS See HPI  Objective:  BP 122/62   Pulse 91   Temp (!) 97 F (36.1 C) (Tympanic)   Ht 5\' 9"  (1.753 m)   Wt 170 lb 12.8 oz (77.5 kg)   SpO2 99%   BMI 25.22 kg/m   Physical Exam Cardiovascular:     Rate and Rhythm: Normal rate and regular rhythm.     Pulses: Normal pulses.     Heart sounds: Normal heart sounds.  Pulmonary:     Effort: Pulmonary effort is normal.     Breath sounds: Normal breath sounds.  Abdominal:     General: Bowel sounds are normal. There is no distension.     Palpations: Abdomen is soft.     Tenderness: There is guarding.     Comments: Epigastric tenderness, surgical incisions well approximated  Neurological:  Mental Status: She is oriented to person, place, and time.  Psychiatric:        Mood and Affect: Mood normal.        Behavior: Behavior normal.        Thought Content: Thought content normal.    Assessment & Plan:  This visit occurred during the SARS-CoV-2 public health emergency.  Safety protocols were in place, including screening questions prior to the visit, additional usage of staff PPE, and extensive cleaning of exam room while observing appropriate contact time as indicated for disinfecting solutions.   Melinda Benton was seen today  for hospitalization follow-up.  Diagnoses and all orders for this visit:  Elevated LFTs -     Cancel: Basic metabolic panel -     Cancel: Hepatic function panel -     Cancel: CBC w/Diff -     Cancel: Lipase -     Discontinue: ondansetron (ZOFRAN) 4 MG tablet; Take 1 tablet (4 mg total) by mouth every 8 (eight) hours as needed for nausea or vomiting. (Patient not taking: Reported on 07/12/2019) -     Hepatic function panel; Future -     Lipase; Future -     CBC w/Diff; Future -     Basic metabolic panel; Future  Gastroesophageal reflux disease without esophagitis -     pantoprazole (PROTONIX) 20 MG tablet; Take 1 tablet (20 mg total) by mouth 2 (two) times daily.  S/P laparoscopic cholecystectomy  Melena -     Cancel: Fecal occult blood, imunochemical([facility]); Standing -     Fecal occult blood, imunochemical([facility]); Standing  Anticoagulant long-term use -     Cancel: Fecal occult blood, imunochemical([facility]); Standing -     Fecal occult blood, imunochemical([facility]); Standing    Problem List Items Addressed This Visit      Other   Anticoagulant long-term use   Relevant Orders   Fecal occult blood, imunochemical([facility])   Elevated LFTs - Primary    S/p lap. cholecystectomy. She reports persistent nausea, loss of appetite and generalized weakness. Only consuming watermelon and water. She is still to schedule appt with surgeon. Last BM yesterday, describes as pellets and black color. She thinks this might be related to peptobimol taken 1week ago.  Repeat CBC CMP Lipase Increase pantoprazole to 20mg  BID Return IFOB card to lab.      Relevant Orders   Hepatic function panel (Completed)   Lipase (Completed)   CBC w/Diff (Completed)   Basic metabolic panel (Completed)   S/P laparoscopic cholecystectomy    Other Visit Diagnoses    Gastroesophageal reflux disease without esophagitis       Relevant Medications    pantoprazole (PROTONIX) 20 MG tablet   Melena       Relevant Orders   Fecal occult blood, imunochemical([facility])      I have spent with this patient regarding history taking, documentation, review of labs, radiology, and specialty note from recent hospitalization, formulating plan and discussing treatment options with patient.  Follow-up: Return if symptoms worsen or fail to improve.  , NP

## 2019-07-12 ENCOUNTER — Other Ambulatory Visit (HOSPITAL_COMMUNITY): Payer: Self-pay | Admitting: Surgery

## 2019-07-12 ENCOUNTER — Other Ambulatory Visit: Payer: Self-pay

## 2019-07-12 ENCOUNTER — Emergency Department (HOSPITAL_COMMUNITY)
Admission: EM | Admit: 2019-07-12 | Discharge: 2019-07-13 | Disposition: A | Discharge status: Home / Self Care | Payer: PRIVATE HEALTH INSURANCE | Attending: Emergency Medicine | Admitting: Emergency Medicine

## 2019-07-12 ENCOUNTER — Encounter (HOSPITAL_COMMUNITY): Payer: Self-pay

## 2019-07-12 ENCOUNTER — Other Ambulatory Visit (INDEPENDENT_AMBULATORY_CARE_PROVIDER_SITE_OTHER): Payer: PRIVATE HEALTH INSURANCE

## 2019-07-12 ENCOUNTER — Other Ambulatory Visit: Payer: Self-pay | Admitting: Surgery

## 2019-07-12 DIAGNOSIS — R1011 Right upper quadrant pain: Secondary | ICD-10-CM | POA: Diagnosis not present

## 2019-07-12 DIAGNOSIS — R7989 Other specified abnormal findings of blood chemistry: Secondary | ICD-10-CM

## 2019-07-12 DIAGNOSIS — G8918 Other acute postprocedural pain: Secondary | ICD-10-CM | POA: Diagnosis not present

## 2019-07-12 DIAGNOSIS — E039 Hypothyroidism, unspecified: Secondary | ICD-10-CM | POA: Insufficient documentation

## 2019-07-12 DIAGNOSIS — R110 Nausea: Secondary | ICD-10-CM

## 2019-07-12 DIAGNOSIS — Z9049 Acquired absence of other specified parts of digestive tract: Secondary | ICD-10-CM

## 2019-07-12 DIAGNOSIS — Z79899 Other long term (current) drug therapy: Secondary | ICD-10-CM | POA: Diagnosis not present

## 2019-07-12 DIAGNOSIS — R945 Abnormal results of liver function studies: Secondary | ICD-10-CM | POA: Diagnosis not present

## 2019-07-12 DIAGNOSIS — R1013 Epigastric pain: Secondary | ICD-10-CM | POA: Insufficient documentation

## 2019-07-12 DIAGNOSIS — R799 Abnormal finding of blood chemistry, unspecified: Secondary | ICD-10-CM | POA: Diagnosis present

## 2019-07-12 LAB — HEPATIC FUNCTION PANEL
ALT: 171 U/L — ABNORMAL HIGH (ref 0–35)
AST: 248 U/L — ABNORMAL HIGH (ref 0–37)
Albumin: 4.4 g/dL (ref 3.5–5.2)
Alkaline Phosphatase: 208 U/L — ABNORMAL HIGH (ref 39–117)
Bilirubin, Direct: 0.2 mg/dL (ref 0.0–0.3)
Total Bilirubin: 0.8 mg/dL (ref 0.2–1.2)
Total Protein: 7.4 g/dL (ref 6.0–8.3)

## 2019-07-12 LAB — BASIC METABOLIC PANEL
BUN: 10 mg/dL (ref 6–23)
CO2: 26 mEq/L (ref 19–32)
Calcium: 9.3 mg/dL (ref 8.4–10.5)
Chloride: 100 mEq/L (ref 96–112)
Creatinine, Ser: 0.83 mg/dL (ref 0.40–1.20)
GFR: 70.33 mL/min (ref >60.00)
Glucose, Bld: 97 mg/dL (ref 70–99)
Potassium: 4.4 mEq/L (ref 3.5–5.1)
Sodium: 137 mEq/L (ref 135–145)

## 2019-07-12 LAB — CBC WITH DIFFERENTIAL/PLATELET
Basophils Absolute: 0.1 10*3/uL (ref 0.0–0.1)
Basophils Relative: 0.6 % (ref 0.0–3.0)
Eosinophils Absolute: 0.1 10*3/uL (ref 0.0–0.7)
Eosinophils Relative: 1.4 % (ref 0.0–5.0)
HCT: 42.8 % (ref 36.0–46.0)
Hemoglobin: 14.6 g/dL (ref 12.0–15.0)
Lymphocytes Relative: 10.3 % — ABNORMAL LOW (ref 12.0–46.0)
Lymphs Abs: 1.1 10*3/uL (ref 0.7–4.0)
MCHC: 34.1 g/dL (ref 30.0–36.0)
MCV: 89.5 fl (ref 78.0–100.0)
Monocytes Absolute: 1.5 10*3/uL — ABNORMAL HIGH (ref 0.1–1.0)
Monocytes Relative: 13.6 % — ABNORMAL HIGH (ref 3.0–12.0)
Neutro Abs: 8.1 10*3/uL — ABNORMAL HIGH (ref 1.4–7.7)
Neutrophils Relative %: 74.1 % (ref 43.0–77.0)
Platelets: 256 10*3/uL (ref 150.0–400.0)
RBC: 4.78 Mil/uL (ref 3.87–5.11)
RDW: 12.4 % (ref 11.5–15.5)
WBC: 10.9 10*3/uL — ABNORMAL HIGH (ref 4.0–10.5)

## 2019-07-12 LAB — LIPASE: Lipase: 108 U/L — ABNORMAL HIGH (ref 11.0–59.0)

## 2019-07-12 MED ORDER — SODIUM CHLORIDE 0.9 % IV BOLUS
1000.0000 mL | Freq: Once | INTRAVENOUS | Status: AC
  Administered 2019-07-13: 1000 mL via INTRAVENOUS

## 2019-07-12 NOTE — ED Triage Notes (Signed)
Patient states she had a lap chole on July/4/21. Patient states she has had increased pain in the mid upper abdomen to the mid back. Patient states the pain stopped when she was talking to the surgeon. Patient was told to come to the ED because of  AST-248 and ALT-171.

## 2019-07-12 NOTE — ED Provider Notes (Signed)
[facility] DEPT Provider Note   CSN: [id number] Arrival date & time: 07/12/19  1657     History Chief Complaint  Patient presents with  . Abnormal Lab    Melinda Benton is a 59 y.o. 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 Marlou Starks who presents to the ED due to abnormal labs outpatient today. Patient states she underwent cholecystectomy on 07/07/19, she was discharged home the following day and had been doing fairly well post op until yesterday AM when she developed fairly quick onset pain to the RUQ/Epigastric area that was severe & constant throughout the day, she took one of her pain pills with some mild relief, but pain persisted into this morning with continued nausea prompting her to call Dr. Ethlyn Gallery office. Unfortunately she could not be seen until next Friday. Around 1PM she had sudden relief of her pain. She went to her PCP office around that time for general check up and had screening labs done- received a call @ 16:30 that her LFTs & pancreas enzymes were abnormal and that she should come to the ED. Currently she feels well, no pain at present, nausea resolved. She thinks she may be a bit dehydrated as her urine has been dark. Last BM yesterday was normal. Denies fever, chills, melena, hematochezia, pale stool, dysuria, chest pain, or dyspnea.   HPI     Past Medical History:  Diagnosis Date  . Arthritis   . Clotting disorder ([place])   . H/O blood clots   . History of chicken pox   . Migraines   . Mitral valve prolapse   . Thyroid disease     Patient Active Problem List   Diagnosis Date Noted  . S/P laparoscopic cholecystectomy 07/11/2019  . Acute cholecystitis 07/06/2019  . Osteopenia of femoral neck, bilateral 11/13/2018  . Osteopenia of lumbar spine 11/13/2018  . Family history of colon cancer 11/05/2018  . Hypothyroidism 11/05/2018  . Chronic left shoulder  pain 03/06/2018  . GAD (generalized anxiety disorder) 03/20/2017  . 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 Chronic pain of right knee 06/07/2016  . Spider varicose veins 09/01/2015  . Cough 05/19/2014  . Palpitations 10/29/2013  . Arthritis 10/09/2013  . Anticoagulant long-term use 08/04/2010  . Pulmonary embolus ([place]) 02/15/2010  . Hypercoagulable state ([place]) 02/15/2010    Past Surgical History:  Procedure Laterality Date  . bladder sur    . CESAREAN SECTION     2  . CHOLECYSTECTOMY N/A 07/07/2019   Procedure: LAPAROSCOPIC CHOLECYSTECTOMY WITH INTRAOPERATIVE CHOLANGIOGRAM;  Surgeon: Jovita Kussmaul, MD;  Location: [facility];  Service: General;  Laterality: N/A;  . ELBOW SURGERY  2009  . knee repla Left 07/2016   partial knee replacement   . ROTATOR CUFF REPAIR  2011  . TONSILLECTOMY  ?1968     OB History   No obstetric history on file.     Family History  Problem Relation Age of Onset  . Protein S deficiency Mother   . Clotting disorder Mother   . Heart disease Father   . Diabetes Father   . Headache Father   . Colon cancer Paternal Grandfather   . Stroke Brother   . Diabetes Paternal Aunt   . Diabetes Paternal Uncle   . Colon cancer Paternal Grandmother 51  . Colon cancer Cousin 35       paternal    Social History   Tobacco Use  .  Smoking status: Never Smoker  . Smokeless tobacco: Never Used  Vaping Use  . Vaping Use: Never used  Substance Use Topics  . Alcohol use: Yes    Comment: Socially  . Drug use: No    Home Medications Prior to Admission medications   Medication Sig Start Date End Date Taking? Authorizing Provider  buPROPion (WELLBUTRIN XL) 300 MG 24 hr tablet Take 1 tablet (300 mg total) by mouth daily. Need Office Visit for additional refills 11/12/17  Yes Nche, Charlene Brooke, NP  levothyroxine (SYNTHROID, LEVOTHROID) 88 MCG tablet Take 88 mcg by mouth daily before breakfast.   Yes [provider]  pantoprazole  (PROTONIX) 20 MG tablet Take 1 tablet (20 mg total) by mouth 2 (two) times daily. 07/11/19  Yes Nche, Charlene Brooke, NP  warfarin (COUMADIN) 6 MG tablet Take 1 tablet (6 mg total) by mouth daily. TAKE AS DIRECTED BY ANTICOAGULATION CLINIC. 07/09/19  Yes Nche, Charlene Brooke, NP  hydrocortisone 2.5 % cream Apply topically 2 (two) times daily. Patient not taking: Reported on 07/12/2019 05/24/19   Nche, Charlene Brooke, NP  Melatonin 5 MG TABS Take 1-2 tablets (5-10 mg total) by mouth at bedtime as needed. Patient not taking: Reported on 07/12/2019 11/05/18   Nche, Charlene Brooke, NP  metoprolol succinate (TOPROL-XL) 25 MG 24 hr tablet Take 1 tablet (25 mg total) by mouth daily as needed. Patient not taking: Reported on 07/12/2019 11/05/18   Nche, Charlene Brooke, NP  ondansetron (ZOFRAN) 4 MG tablet Take 1 tablet (4 mg total) by mouth every 8 (eight) hours as needed for nausea or vomiting. Patient not taking: Reported on 07/12/2019 07/11/19   Nche, Charlene Brooke, NP    Allergies    Patient has no known allergies.  Review of Systems   Review of Systems  Constitutional: Negative for chills and fever.  Respiratory: Negative for shortness of breath.   Cardiovascular: Negative for chest pain.  Gastrointestinal: Positive for abdominal pain and nausea. Negative for blood in stool, constipation, diarrhea and vomiting.  Genitourinary: Negative for dysuria and vaginal discharge.  Neurological: Negative for syncope.  All other systems reviewed and are negative.   Physical Exam Updated Vital Signs BP (!) 141/85 (BP Location: Right Arm)   Pulse 92   Temp 98.6 F (37 C) (Oral)   Resp 16   Ht '5\' 9"'  (1.753 m)   Wt 77.5 kg   SpO2 100%   BMI 25.22 kg/m   Physical Exam Vitals and nursing note reviewed.  Constitutional:      General: She is not in acute distress.    Appearance: She is well-developed. She is not toxic-appearing.  HENT:     Head: Normocephalic and atraumatic.  Eyes:     General:        Right eye:  No discharge.        Left eye: No discharge.     Conjunctiva/sclera: Conjunctivae normal.  Cardiovascular:     Rate and Rhythm: Normal rate and regular rhythm.  Pulmonary:     Effort: Pulmonary effort is normal. No respiratory distress.     Breath sounds: Normal breath sounds. No wheezing, rhonchi or rales.  Abdominal:     General: There is no distension.     Palpations: Abdomen is soft.     Tenderness: There is abdominal tenderness (epigastrium and mild to RUQ). There is no right CVA tenderness, left CVA tenderness or guarding.     Comments: Surgical incision sites with dermabond, no significant erythema/warmth/drainage.  Musculoskeletal:     Cervical back: Neck supple.  Skin:    General: Skin is warm and dry.     Findings: No rash.  Neurological:     Mental Status: She is alert.     Comments: Clear speech.   Psychiatric:        Behavior: Behavior normal.    ED Results / Procedures / Treatments   Labs (all labs ordered are listed, but only abnormal results are displayed) Labs Reviewed  CBC WITH DIFFERENTIAL/PLATELET  COMPREHENSIVE METABOLIC PANEL  LIPASE, BLOOD    EKG None  Radiology No results found.  Procedures Procedures (including critical care time)  Medications Ordered in ED Medications - No data to display  ED Course  I have reviewed the triage vital signs and the nursing notes.  Pertinent labs & imaging results that were available during my care of the patient were reviewed by me and considered in my medical decision making (see chart for details).    MDM Rules/Calculators/A&P                         Patient presents to the ED with complaints of abnormal labs outpatient today in setting of post op pain that is resolved S/p lab cholecystectomy 07/04. Nontoxic, vitals without significant abnormalities. Exam w/ appropriate appearing surgical incision sites, epigastric/RUQ tenderness without obvious peritoneal signs.   Additional history obtained:    Additional history obtained from chart review & nursing note review. Previous records obtained and reviewed.  Labs today by PCP from 13:31: Elevated LFTs & alk phos- AST: 248, ALT 171, alk phos 208, lipase elevated 108, mild leukocytosis at 10.9.  ED Course:  Will discuss with general surgery on call regarding further evaluation/management.   23:01: CONSULT: Discussed patient case with general surgeon Dr. Ninfa Linden who has reviewed labs from earlier today- he states if patient's pain is resolved @ this time can be discharged home, may need some fluids, follow up in clinic as scheduled, and have patient return to the ED if pain returns, relays no need for CT imaging, HIDA scheduled for next week. Appreciate consultation.   Will recheck labs to ensure stable and given 1L NS.   I have ordered, reviewed, and personally interpreted labs including CBC, CMP, lipase, and urinalysis.  LFTs and lipase are downtrending.  No leukocytosis.  On reassessment the patient she remains pain-free, abdominal exam remains without peritoneal signs.  Per discussion with general surgery will discharge home with very strict return precautions which patient and her husband are in agreement with. I discussed results, treatment plan, need for follow-up, and return precautions with the patient. Provided opportunity for questions, patient confirmed understanding and is in agreement with plan.   Findings and plan of care discussed with supervising physician Dr. Darl Householder who is in agreement.   Portions of this note were generated with [employer]. Dictation errors may occur despite best attempts at proofreading.  Final Clinical Impression(s) / ED Diagnoses Final diagnoses:  Post-op pain  Elevated LFTs    Rx / DC Orders ED Discharge Orders    None       Amaryllis Dyke, PA-C 07/13/19 0106    Drenda Freeze, MD 07/15/19 1505

## 2019-07-13 LAB — CBC WITH DIFFERENTIAL/PLATELET
Abs Immature Granulocytes: 0.14 10*3/uL — ABNORMAL HIGH (ref 0.00–0.07)
Basophils Absolute: 0.1 10*3/uL (ref 0.0–0.1)
Basophils Relative: 1 %
Eosinophils Absolute: 0.2 10*3/uL (ref 0.0–0.5)
Eosinophils Relative: 3 %
HCT: 39.1 % (ref 36.0–46.0)
Hemoglobin: 13.1 g/dL (ref 12.0–15.0)
Immature Granulocytes: 2 %
Lymphocytes Relative: 22 %
Lymphs Abs: 1.6 10*3/uL (ref 0.7–4.0)
MCH: 30.3 pg (ref 26.0–34.0)
MCHC: 33.5 g/dL (ref 30.0–36.0)
MCV: 90.5 fL (ref 80.0–100.0)
Monocytes Absolute: 1 10*3/uL (ref 0.1–1.0)
Monocytes Relative: 15 %
Neutro Abs: 4 10*3/uL (ref 1.7–7.7)
Neutrophils Relative %: 57 %
Platelets: 260 10*3/uL (ref 150–400)
RBC: 4.32 MIL/uL (ref 3.87–5.11)
RDW: 11.8 % (ref 11.5–15.5)
WBC: 7 10*3/uL (ref 4.0–10.5)
nRBC: 0 % (ref 0.0–0.2)

## 2019-07-13 LAB — COMPREHENSIVE METABOLIC PANEL
ALT: 140 U/L — ABNORMAL HIGH (ref 0–44)
AST: 122 U/L — ABNORMAL HIGH (ref 15–41)
Albumin: 3.9 g/dL (ref 3.5–5.0)
Alkaline Phosphatase: 175 U/L — ABNORMAL HIGH (ref 38–126)
Anion gap: 10 (ref 5–15)
BUN: 13 mg/dL (ref 6–20)
CO2: 26 mmol/L (ref 22–32)
Calcium: 8.9 mg/dL (ref 8.9–10.3)
Chloride: 102 mmol/L (ref 98–111)
Creatinine, Ser: 0.83 mg/dL (ref 0.44–1.00)
GFR calc Af Amer: >60 mL/min (ref >60)
GFR calc non Af Amer: >60 mL/min (ref >60)
Glucose, Bld: 104 mg/dL — ABNORMAL HIGH (ref 70–99)
Potassium: 4.2 mmol/L (ref 3.5–5.1)
Sodium: 138 mmol/L (ref 135–145)
Total Bilirubin: 0.4 mg/dL (ref 0.3–1.2)
Total Protein: 7.7 g/dL (ref 6.5–8.1)

## 2019-07-13 LAB — URINALYSIS, ROUTINE W REFLEX MICROSCOPIC
Bacteria, UA: NONE SEEN
Bilirubin Urine: NEGATIVE
Glucose, UA: NEGATIVE mg/dL
Ketones, ur: NEGATIVE mg/dL
Leukocytes,Ua: NEGATIVE
Nitrite: NEGATIVE
Protein, ur: NEGATIVE mg/dL
Specific Gravity, Urine: 1.009 (ref 1.005–1.030)
pH: 6 (ref 5.0–8.0)

## 2019-07-13 LAB — LIPASE, BLOOD: Lipase: 56 U/L — ABNORMAL HIGH (ref 11–51)

## 2019-07-13 NOTE — Discharge Instructions (Addendum)
You were seen in the emergency department today for abdominal pain with abnormal labs performed at your primary care office.  It appears that your blood work is improving, your liver function tests and your lipase (measure of the pancreas) are still somewhat elevated but they are better than they were earlier today.  We discussed with the general surgery team.  We would like you to follow-up closely with them as scheduled next week.  Return to the emergency department immediately for new or worsening symptoms including but not limited to return of pain, new pain, vomiting, fever, chills, passing out, blood in your vomit/stool/urine, or any other concerns.

## 2019-07-15 ENCOUNTER — Encounter: Payer: Self-pay | Admitting: Nurse Practitioner

## 2019-07-15 DIAGNOSIS — R7989 Other specified abnormal findings of blood chemistry: Secondary | ICD-10-CM | POA: Insufficient documentation

## 2019-07-15 NOTE — Assessment & Plan Note (Signed)
S/p lap. cholecystectomy. She reports persistent nausea, loss of appetite and generalized weakness. Only consuming watermelon and water. She is still to schedule appt with surgeon. Last BM yesterday, describes as pellets and black color. She thinks this might be related to peptobimol taken 1week ago.  Repeat CBC CMP Lipase Increase pantoprazole to 20mg  BID Return IFOB card to lab.

## 2019-07-16 ENCOUNTER — Ambulatory Visit: Payer: PRIVATE HEALTH INSURANCE

## 2019-07-19 ENCOUNTER — Encounter (HOSPITAL_COMMUNITY): Payer: PRIVATE HEALTH INSURANCE

## 2019-07-19 ENCOUNTER — Encounter (HOSPITAL_COMMUNITY): Payer: Self-pay

## 2019-07-19 ENCOUNTER — Ambulatory Visit
Admission: RE | Admit: 2019-07-19 | Discharge: 2019-07-19 | Disposition: A | Discharge status: Home / Self Care | Payer: PRIVATE HEALTH INSURANCE | Source: Ambulatory Visit | Attending: Endocrinology | Admitting: Endocrinology

## 2019-07-19 DIAGNOSIS — E069 Thyroiditis, unspecified: Secondary | ICD-10-CM

## 2019-08-29 ENCOUNTER — Ambulatory Visit: Payer: PRIVATE HEALTH INSURANCE

## 2019-09-03 ENCOUNTER — Other Ambulatory Visit: Payer: Self-pay

## 2019-09-03 ENCOUNTER — Ambulatory Visit (INDEPENDENT_AMBULATORY_CARE_PROVIDER_SITE_OTHER): Payer: PRIVATE HEALTH INSURANCE | Admitting: General Practice

## 2019-09-03 DIAGNOSIS — Z7901 Long term (current) use of anticoagulants: Secondary | ICD-10-CM | POA: Diagnosis not present

## 2019-09-03 LAB — POCT INR: INR: 2.7 (ref 2.0–3.0)

## 2019-09-03 NOTE — Progress Notes (Signed)
.  [facility] Medical screening examination/treatment/procedure(s) were performed by non-physician practitioner and as supervising physician I was immediately available for consultation/collaboration. I agree with above. Oliver Barre, MD

## 2019-09-03 NOTE — Patient Instructions (Signed)
Pre visit review using our clinic review tool, if applicable. No additional management support is needed unless otherwise documented below in the visit note.  Continue to take 1 tablet daily except 1/2 on Wednesdays and Saturdays.  Re-check in 6 to 8 weeks per patient request.    

## 2019-10-24 NOTE — Progress Notes (Signed)
Cardiology Office Note Date:  10/25/2019  Patient ID:  Melinda Benton, Melinda Benton [birth year], MRN [id number] PCP:  Anne Ng, NP  Cardiologist/Electrophysiologist: Dr. Ladona Ridgel     Chief Complaint: SOB, CP   History of Present Illness: Melinda Benton is a 59 y.o. 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 Ladona Ridgel, last seen by him back in May 2019, at that time her palpitations described as mostly controlled and instructed to take a second Toprol if needed  Most recently suffered acute cholecystits and underwent lap-chole 07/07/19.  TODAY She feels in the last several months that she has noticed that she has reduced exertional capacity, generally fatigued,. She mentions that she works A LOT, often 7 days a week and many hours a day.  Pleas Koch much of her time either by plane or car, and does not exercise in any way now for some years, She has gained about 20lbs in the last 2 years. She noticed a couple weekends ago when she and her husband took an afternoon off and went to the [facility], they stopped a couple times and walks some very easy tails and she noticed that this minimal exertion was making her winded. She has no resting SOB, no symptoms of orthopnea, PND. No calf pain, skin changes. Chronically when traveling she will tend to get some swelling in her feet/ankles that she uses PRN diuretic for, this is not new. She has noticed some brief palpitations, usually at night. She denies CP but says sometimes a vague tightness that is fairlly randome  She has recently been found with pre-DM and elevated cholesterol by her PMD team. She follows her INR via coumadin clinic at heme office.  Denies any bleeding or signs of bleeding.  Her IR's have been stable and therapeutic for a long  time   Past Medical History:  Diagnosis Date  . Arthritis   . Clotting disorder ([facility])   . H/O blood clots   . History of chicken pox   . Migraines   . Mitral valve prolapse   . Thyroid disease     Past Surgical History:  Procedure Laterality Date  . bladder sur    . CESAREAN SECTION     2  . CHOLECYSTECTOMY N/A 07/07/2019   Procedure: LAPAROSCOPIC CHOLECYSTECTOMY WITH INTRAOPERATIVE CHOLANGIOGRAM;  Surgeon: Griselda Miner, MD;  Location: [facility];  Service: General;  Laterality: N/A;  . ELBOW SURGERY  2009  . knee repla Left 07/2016   partial knee replacement   . ROTATOR CUFF REPAIR  2011  . TONSILLECTOMY  ?1968    Current Outpatient Medications  Medication Sig Dispense Refill  . buPROPion (WELLBUTRIN XL) 300 MG 24 hr tablet Take 1 tablet (300 mg total) by mouth daily. Need Office Visit for additional refills 90 tablet 0  . levothyroxine (SYNTHROID, LEVOTHROID) 88 MCG tablet Take 88 mcg by mouth daily before breakfast.    . metoprolol succinate (TOPROL-XL) 25 MG 24 hr tablet Take 1 tablet (25 mg total) by mouth daily as needed. 30 tablet 5  . pantoprazole (PROTONIX) 20 MG tablet Take 1 tablet (20 mg total) by mouth 2 (two) times daily. 30 tablet 0  . warfarin (COUMADIN) 6 MG tablet Take 1 tablet (6 mg total) by mouth daily. TAKE AS DIRECTED BY  ANTICOAGULATION CLINIC. 90 tablet 1   No current facility-administered medications for this visit.    Allergies:   Patient has no known allergies.   Social History:  The patient  reports that she has never smoked. She has never used smokeless tobacco. She reports current alcohol use. She reports that she does not use drugs.   Family History:  The patient's family history includes Clotting disorder in her mother; Colon cancer in her paternal grandfather; Colon cancer (age of onset: 20) in her cousin; Colon cancer (age of onset: 23) in her paternal grandmother; Diabetes in her father, paternal aunt, and paternal uncle; Headache in her  father; Heart disease in her father; Protein S deficiency in her mother; Stroke in her brother.  ROS:  Please see the history of present illness.    All other systems are reviewed and otherwise negative.   PHYSICAL EXAM:  VS:  BP 138/70   Pulse 66   Ht 5' 8.75" (1.746 m)   Wt 173 lb (78.5 kg)   SpO2 99%   BMI 25.73 kg/m  BMI: Body mass index is 25.73 kg/m. Well nourished, well developed, in no acute distress HEENT: normocephalic, atraumatic Neck: no JVD, carotid bruits or masses Cardiac:  RRR; no significant murmurs, no rubs, or gallops Lungs:  CTA b/l, no wheezing, rhonchi or rales Abd: soft, nontender MS: no deformity or atrophy Ext: no edema, no skin changes, no calf tenderness of palpable masses Skin: warm and dry, no rash Neuro:  No gross deficits appreciated Psych: euthymic mood, full affect     EKG:  Done today and reviewed by myself shows  SR 66bpm, no ST/T changes  01/18/2017; TTE Study Conclusions  - Left ventricle: The cavity size was normal. Wall thickness was  normal. Systolic function was normal. The estimated ejection  fraction was in the range of 60% to 65%. Wall motion was normal;  there were no regional wall motion abnormalities. Doppler  parameters are consistent with abnormal left ventricular  relaxation (grade 1 diastolic dysfunction). The E/e&' ratio is  between 8-15, suggesting indeterminate LV filling pressure.  - Left atrium: The atrium was normal in size.  - Right atrium: The atrium was normal in size.  - Tricuspid valve: There was trivial regurgitation.  - Pulmonary arteries: PA peak pressure: 19 mm Hg (S).  - Inferior vena cava: The vessel was normal in size. The  respirophasic diameter changes were in the normal range (= 50%),  consistent with normal central venous pressure.  - Pericardium, extracardiac: A trivial pericardial effusion was  identified posterior to the heart. Features were not consistent  with tamponade  physiology.   Impressions:  - Compared to a prior study in 2015, there are few changes. LVEF is  60-65% with a persistent trivial posterior pericardial effusion.    11/2013: 48hour monitor 1. Rare PVCs 2. Sinus tachycardia   12/13/2004: EPS/Ablation CONCLUSION:  The study demonstrates successful electrolyte study and RF  catheter ablation of unusual AV node reentry tachycardia with a total of 12  RF energy applications delivered in brief duration to multiple sites, 6  through 8 and Koch's triangle.  There were no immediate procedure  complications.  Recent Labs: 11/06/2018: TSH 1.67 07/08/2019: Magnesium 2.3 07/12/2019: ALT 140; BUN 13; Creatinine, Ser 0.83; Hemoglobin 13.1; Platelets 260; Potassium 4.2; Sodium 138  11/06/2018: Cholesterol 233; HDL 61; LDL Cholesterol 150; Triglycerides 124   CrCl cannot be calculated (Patient's most recent lab result is older than the maximum 21 days  allowed.).   Wt Readings from Last 3 Encounters:  10/25/19 173 lb (78.5 kg)  07/13/19 170 lb (77.1 kg)  07/11/19 170 lb 12.8 oz (77.5 kg)     Other studies reviewed: Additional studies/records reviewed today include: summarized above  ASSESSMENT AND PLAN:  1. Atypical AFlutter     CHA2DS2Vasc is one for gender     Infrequent palpitations, perhaps more of late  She is maintained on warfarin with h/p PE and hypercoagulable status w/protien S deficiency   2. Symptoms as described above     Suspect deconditioning as primary etiology     Will update her echo, get a lexiscan myoview.  Discussed monitoring but she would like to hold off on that for now     Given somewhat chronic slowly progressing observations, and compliant on warfarin with reports of stable and therapeutic INRs, low suspicion  PE     O2 sat on RA os 99%   Disposition: F/u with me in 1 mo, sooner if needed  Current medicines are reviewed at length with the patient today.  The patient did not have any concerns regarding  medicines.  Norma Fredrickson, PA-C 10/25/2019 9:54 AM     [facility] [address] [place] [address] [phone] (office)  [phone] (fax)

## 2019-10-25 ENCOUNTER — Encounter: Payer: Self-pay | Admitting: *Deleted

## 2019-10-25 ENCOUNTER — Ambulatory Visit: Payer: PRIVATE HEALTH INSURANCE | Admitting: Physician Assistant

## 2019-10-25 ENCOUNTER — Other Ambulatory Visit: Payer: Self-pay

## 2019-10-25 VITALS — BP 138/70 | HR 66 | Ht 68.75 in | Wt 173.0 lb

## 2019-10-25 DIAGNOSIS — R002 Palpitations: Secondary | ICD-10-CM

## 2019-10-25 DIAGNOSIS — I484 Atypical atrial flutter: Secondary | ICD-10-CM | POA: Diagnosis not present

## 2019-10-25 DIAGNOSIS — R079 Chest pain, unspecified: Secondary | ICD-10-CM | POA: Diagnosis not present

## 2019-10-25 DIAGNOSIS — R0602 Shortness of breath: Secondary | ICD-10-CM

## 2019-10-25 NOTE — Patient Instructions (Signed)
Medication Instructions:   Your physician recommends that you continue on your current medications as directed. Please refer to the Current Medication list given to you today.  *If you need a refill on your cardiac medications before your next appointment, please call your pharmacy*   Lab Work: NONE ORDERED  TODAY   If you have labs (blood work) drawn today and your tests are completely normal, you will receive your results only by: Marland Kitchen MyChart Message (if you have MyChart) OR . A paper copy in the mail If you have any lab test that is abnormal or we need to change your treatment, we will call you to review the results.   Testing/Procedures: Your physician has requested that you have a lexiscan myoview. For further information please visit [email or web address] Please follow instruction sheet, as given.  Your physician has requested that you have an echocardiogram. Echocardiography is a painless test that uses sound waves to create images of your heart. It provides your doctor with information about the size and shape of your heart and how well your heart's chambers and valves are working. This procedure takes approximately one hour. There are no restrictions for this procedure.   Follow-Up: At [facility], you and your health needs are our priority.  As part of our continuing mission to provide you with exceptional heart care, we have created designated Provider Care Teams.  These Care Teams include your primary Cardiologist (physician) and Advanced Practice Providers (APPs -  Physician Assistants and Nurse Practitioners) who all work together to provide you with the care you need, when you need it.  We recommend signing up for the patient portal called "MyChart".  Sign up information is provided on this After Visit Summary.  MyChart is used to connect with patients for Virtual Visits (Telemedicine).  Patients are able to view lab/test results, encounter notes, upcoming appointments, etc.   Non-urgent messages can be sent to your provider as well.   To learn more about what you can do with MyChart, go to [email or web address].    Your next appointment:   1 month(s)  The format for your next appointment:   In Person  Provider:   You may see Dr. Ladona Ridgel  or one of the following Advanced Practice Providers on your designated Care Team:    Gypsy Balsam, NP  Francis Dowse, PA-C  Casimiro Needle "Otilio Saber, [place]    Other Instructions

## 2019-11-05 ENCOUNTER — Ambulatory Visit (INDEPENDENT_AMBULATORY_CARE_PROVIDER_SITE_OTHER): Payer: PRIVATE HEALTH INSURANCE | Admitting: General Practice

## 2019-11-05 ENCOUNTER — Other Ambulatory Visit: Payer: Self-pay

## 2019-11-05 DIAGNOSIS — D6859 Other primary thrombophilia: Secondary | ICD-10-CM

## 2019-11-05 LAB — POCT INR: INR: 1.8 — AB (ref 2.0–3.0)

## 2019-11-05 NOTE — Patient Instructions (Addendum)
Pre visit review using our clinic review tool, if applicable. No additional management support is needed unless otherwise documented below in the visit note.  Take 9 mg today and then continue to take 1 tablet daily except 1/2 on Wednesdays and Saturdays.  Re-check in 6 to 8 weeks per patient request.

## 2019-11-05 NOTE — Progress Notes (Signed)
Medical screening examination/treatment/procedure(s) were performed by non-physician practitioner and as supervising physician I was immediately available for consultation/collaboration. I agree with above. Jefferey Lippmann, MD   

## 2019-11-13 ENCOUNTER — Telehealth (HOSPITAL_COMMUNITY): Payer: Self-pay

## 2019-11-13 NOTE — Telephone Encounter (Signed)
Detailed instructions left on the patient's answering machine. Asked to call back with any questions. S.Raneshia Derick EMTP 

## 2019-11-19 ENCOUNTER — Ambulatory Visit (HOSPITAL_COMMUNITY): Payer: PRIVATE HEALTH INSURANCE | Attending: Physician Assistant

## 2019-11-19 ENCOUNTER — Ambulatory Visit (HOSPITAL_BASED_OUTPATIENT_CLINIC_OR_DEPARTMENT_OTHER): Payer: PRIVATE HEALTH INSURANCE

## 2019-11-19 ENCOUNTER — Other Ambulatory Visit: Payer: Self-pay

## 2019-11-19 DIAGNOSIS — R079 Chest pain, unspecified: Secondary | ICD-10-CM | POA: Insufficient documentation

## 2019-11-19 DIAGNOSIS — R002 Palpitations: Secondary | ICD-10-CM | POA: Diagnosis not present

## 2019-11-19 DIAGNOSIS — R0602 Shortness of breath: Secondary | ICD-10-CM | POA: Insufficient documentation

## 2019-11-19 LAB — MYOCARDIAL PERFUSION IMAGING
LV dias vol: 64 mL (ref 46–106)
LV sys vol: 17 mL
Peak HR: 92 {beats}/min
Rest HR: 63 {beats}/min
SDS: 1
SRS: 0
SSS: 1
TID: 0.96

## 2019-11-19 LAB — ECHOCARDIOGRAM COMPLETE
Area-P 1/2: 3.6 cm2
Height: 68.75 in
S' Lateral: 2.3 cm
Weight: 2768 oz

## 2019-11-19 MED ORDER — TECHNETIUM TC 99M TETROFOSMIN IV KIT
11.0000 | PACK | Freq: Once | INTRAVENOUS | Status: AC | PRN
  Administered 2019-11-19: 11 via INTRAVENOUS
  Filled 2019-11-19: qty 11

## 2019-11-19 MED ORDER — REGADENOSON 0.4 MG/5ML IV SOLN
0.4000 mg | Freq: Once | INTRAVENOUS | Status: AC
  Administered 2019-11-19: 0.4 mg via INTRAVENOUS

## 2019-11-19 MED ORDER — TECHNETIUM TC 99M TETROFOSMIN IV KIT
32.2000 | PACK | Freq: Once | INTRAVENOUS | Status: AC | PRN
  Administered 2019-11-19: 32.2 via INTRAVENOUS
  Filled 2019-11-19: qty 33

## 2019-11-20 ENCOUNTER — Ambulatory Visit: Payer: PRIVATE HEALTH INSURANCE | Admitting: Internal Medicine

## 2019-11-20 ENCOUNTER — Encounter: Payer: Self-pay | Admitting: Internal Medicine

## 2019-11-20 VITALS — BP 122/78 | HR 70 | Ht 69.0 in | Wt 174.8 lb

## 2019-11-20 DIAGNOSIS — R002 Palpitations: Secondary | ICD-10-CM

## 2019-11-20 NOTE — Patient Instructions (Signed)

## 2019-11-21 NOTE — Progress Notes (Signed)
HPI Melinda Benton returns today for followup. 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 She has gained some weight during Covid and admits to not exercising. She has not had chest pain. She has occaisional palpitations. No syncope or chest pain. She denies bleeding. She describes anxiety and is using metoprolol as needed.  No Known Allergies   Current Outpatient Medications  Medication Sig Dispense Refill  . buPROPion (WELLBUTRIN XL) 300 MG 24 hr tablet Take 1 tablet (300 mg total) by mouth daily. Need Office Visit for additional refills 90 tablet 0  . levothyroxine (SYNTHROID, LEVOTHROID) 88 MCG tablet Take 88 mcg by mouth daily before breakfast.    . metoprolol succinate (TOPROL-XL) 25 MG 24 hr tablet Take 1 tablet (25 mg total) by mouth daily as needed. 30 tablet 5  . pantoprazole (PROTONIX) 20 MG tablet Take 20 mg by mouth daily as needed for heartburn or indigestion.    Marland Kitchen warfarin (COUMADIN) 6 MG tablet Take 1 tablet (6 mg total) by mouth daily. TAKE AS DIRECTED BY ANTICOAGULATION CLINIC. 90 tablet 1   No current facility-administered medications for this visit.     Past Medical History:  Diagnosis Date  . Arthritis   . Clotting disorder (HCC)   . H/O blood clots   . History of chicken pox   . Migraines   . Mitral valve prolapse   . Thyroid disease     ROS:   All systems reviewed and negative except as noted in the HPI.   Past Surgical History:  Procedure Laterality Date  . bladder sur    . CESAREAN SECTION     2  . CHOLECYSTECTOMY N/A 07/07/2019   Procedure: LAPAROSCOPIC CHOLECYSTECTOMY WITH INTRAOPERATIVE CHOLANGIOGRAM;  Surgeon: Griselda Miner, MD;  Location: [facility];  Service: General;  Laterality: N/A;  . ELBOW SURGERY  2009  . knee repla Left 07/2016   partial knee replacement   . ROTATOR CUFF REPAIR  2011  . TONSILLECTOMY  ?1968     Family History  Problem Relation Age of Onset  .  Protein S deficiency Mother   . Clotting disorder Mother   . Heart disease Father   . Diabetes Father   . Headache Father   . Colon cancer Paternal Grandfather   . Stroke Brother   . Diabetes Paternal Aunt   . Diabetes Paternal Uncle   . Colon cancer Paternal Grandmother 94  . Colon cancer Cousin 35       paternal     Social History   Socioeconomic History  . Marital status: Married    Spouse name: Not on file  . Number of children: 3  . Years of education: 71  . Highest education level: Not on file  Occupational History  . Occupation: Technical brewer  Tobacco Use  . Smoking status: Never Smoker  . Smokeless tobacco: Never Used  Vaping Use  . Vaping Use: Never used  Substance and Sexual Activity  . Alcohol use: Yes    Comment: Socially  . Drug use: No  . Sexual activity: Yes  Other Topics Concern  . Not on file  Social History Narrative   Regular exercise-yes   Caffeine Use-yes   Social Determinants of Health   Financial Resource Strain:   . Difficulty of Paying Living Expenses: Not on file  Food Insecurity:   . Worried About Radiation protection practitioner  of Food in the Last Year: Not on file  . Ran Out of Food in the Last Year: Not on file  Transportation Needs:   . Lack of Transportation (Medical): Not on file  . Lack of Transportation (Non-Medical): Not on file  Physical Activity:   . Days of Exercise per Week: Not on file  . Minutes of Exercise per Session: Not on file  Stress:   . Feeling of Stress : Not on file  Social Connections:   . Frequency of Communication with Friends and Family: Not on file  . Frequency of Social Gatherings with Friends and Family: Not on file  . Attends Religious Services: Not on file  . Active Member of Clubs or Organizations: Not on file  . Attends Banker Meetings: Not on file  . Marital Status: Not on file  Intimate Partner Violence:   . Fear of Current or Ex-Partner: Not on file  . Emotionally Abused: Not on file   . Physically Abused: Not on file  . Sexually Abused: Not on file     BP 122/78   Pulse 70   Ht 5\' 9"  (1.753 m)   Wt 174 lb 12.8 oz (79.3 kg)   LMP 05/01/2012   SpO2 98%   BMI 25.81 kg/m   Physical Exam:  Well appearing middle aged woman, NAD HEENT: Unremarkable Neck:  No JVD, no thyromegally Lymphatics:  No adenopathy Back:  6 cm CVA tenderness Lungs:  Clear with no wheezes HEART:  Regular rate rhythm, no murmurs, no rubs, no clicks Abd:  soft, positive bowel sounds, no organomegally, no rebound, no guarding Ext:  2 plus pulses, no edema, no cyanosis, no clubbing Skin:  No rashes no nodules Neuro:  CN II through XII intact, motor grossly intact  Assess/Plan: 1. Palpitations - her symptoms are fairly well controlled. I mentioned the use of daily toprol rather than PRN but she prefers to hold off on daily beta blocker 2. HTN - her bp is well controlled.  3. Coags - she has not had any bleeding on coumadin. 4. Remote PE - she denies sob or chest pain. She will continue warfarin.  05/03/2012 Mirabelle Cyphers,MD

## 2019-12-02 ENCOUNTER — Ambulatory Visit: Payer: PRIVATE HEALTH INSURANCE | Admitting: Family Medicine

## 2019-12-02 ENCOUNTER — Encounter: Payer: Self-pay | Admitting: Family Medicine

## 2019-12-02 ENCOUNTER — Other Ambulatory Visit: Payer: Self-pay

## 2019-12-02 ENCOUNTER — Ambulatory Visit (INDEPENDENT_AMBULATORY_CARE_PROVIDER_SITE_OTHER): Payer: PRIVATE HEALTH INSURANCE

## 2019-12-02 VITALS — BP 130/76 | HR 74 | Ht 69.0 in | Wt 178.8 lb

## 2019-12-02 DIAGNOSIS — M79672 Pain in left foot: Secondary | ICD-10-CM

## 2019-12-02 DIAGNOSIS — S92515A Nondisplaced fracture of proximal phalanx of left lesser toe(s), initial encounter for closed fracture: Secondary | ICD-10-CM

## 2019-12-02 NOTE — Progress Notes (Signed)
    Subjective:    CC: L toe pain  I, Molly Weber, LAT, ATC, am serving as scribe for Dr. [name].  HPI: Pt is a 59 y/o female presenting w/ c/o L 4th-5th toe pain after "stubbing her toe on a piece of furniture" on Saturday, Nov 27/2021.  She locates her pain to her L 5th toe and L lateral forefoot.  She reports having heard a snap/crack at the time of injury.  Swelling: yes Bruising: yes Aggravating factors: weight-bearing/walking; pressure to the area; attempts at L 5th toe AROM Treatments tried: ice  Pertinent review of Systems: No fevers or chills  Relevant historical information: Osteopenia DEXA scan last year.  Not taking calcium or vitamin D   Objective:    Vitals:   12/02/19 1009  BP: 130/76  Pulse: 74  SpO2: 99%   General: Well Developed, well nourished, and in no acute distress.   MSK: Left fifth toe swollen with bruising normal alignment.  Sensation and capillary fill are intact distally.  Lab and Radiology Results X-ray images left foot obtained today personally and independently interpreted Oblique/spiral fracture through the midshaft of the proximal phalanx fifth toe.  Slight dorsal angulation otherwise alignment is normal. Await formal radiology review   Impression and Recommendations:    Assessment and Plan: 59 y.o. female with left fifth toe fracture.  Adequately aligned.  This should heal well.  Plan for buddy tape and postop shoe.  Recommend calcium and vitamin D.  Of note patient is leaving for a tropical vacation December 23.  Advised that she should be well enough to be able to walk with buddy tape and be able to swim.  Recommend recheck with me in 2 to 3 weeks.  She already is scheduled to see primary care provider on December 21 that would be a good time to visit.Marland Kitchen  PDMP not reviewed this encounter. Orders Placed This Encounter  Procedures  . DG Foot Complete Left    Standing Status:   Future    Number of Occurrences:   1    Standing  Expiration Date:   01/01/2020    Order Specific Question:   Reason for Exam (SYMPTOM  OR DIAGNOSIS REQUIRED)    Answer:   L foot pain    Order Specific Question:   Is patient pregnant?    Answer:   No    Order Specific Question:   Preferred imaging location?    Answer:   [facility]   No orders of the defined types were placed in this encounter.   Discussed warning signs or symptoms. Please see discharge instructions. Patient expresses understanding.   The above documentation has been reviewed and is accurate and complete [name], M.D.

## 2019-12-02 NOTE — Progress Notes (Signed)
X-ray of foot shows a fracture at the pinky toe like we discussed.

## 2019-12-02 NOTE — Patient Instructions (Addendum)
Thank you for coming in today.  Buddy Tape the toes.   Repeat xray in about 2 ish weeks.  Schedule Dec 20 or 21st.  Return sooner if needed.   Use post op shoe.  If that is not comfortable we can increase to a cam walker boot.   Ok to switch to a comfortable sneaker type shoe when able.   Start calcium and Vit D.   1000-5000 units of OTC  Vit d3 daily.   1000 mg calcium daily ish. Extra strength tums is ok.

## 2019-12-23 NOTE — Progress Notes (Signed)
   I, Philbert Riser, LAT, ATC acting as a scribe for [name], MD.  Melinda Benton is a 59 y.o. female who presents to [facility] at [facility] today for f/u L 5th toe fx. Pt previously "stubbed her toe" on a piece of furniture on 11/27. Pt was last seen by Dr. Denyse Amass on 12/02/19 and was advised to buddy tape toe and wear post-op shoe. Of note, patient is leaving for a tropical vacation on 12/23. Today, pt reports foot is slowing getting better. Pt is compliant in wearing post-op shoe and buddy taping. Pt has tried walking without shoe and reports increased pain. Pt c/o continued tenderness.  Dx imaging: 12/02/19 L foot XR  Pertinent review of systems: No fevers or chills  Relevant historical information: History of pulmonary embolus on warfarin.   Exam:  BP 102/74 (BP Location: Right Arm, Patient Position: Sitting, Cuff Size: Normal)   Pulse 64   Ht 5\' 9"  (1.753 m)   LMP 05/01/2012   SpO2 100%   BMI 26.40 kg/m  General: Well Developed, well nourished, and in no acute distress.   MSK: Left foot nontender    Lab and Radiology Results X-ray of left foot fifth toe images obtained today personally independently interpreted Visible fracture lines present at proximal phalanx fifth toe. No angulation or displacement. No callus formation present. Await formal radiology review    Assessment and Plan: 59 y.o. female with toe fracture about 32 weeks old now. Doing well with immobilization. Not a lot of healing seen on x-ray yet. Spent a lot of the visit today strategizing plan for her vacation starting 2 days from now. Stressed the importance of protecting her toe while she is in and out of the water. Check back in 3 weeks.   PDMP not reviewed this encounter. Orders Placed This Encounter  Procedures  . DG Foot Complete Left    Standing Status:   Future    Number of Occurrences:   1    Standing Expiration Date:   12/23/2020    Order Specific Question:   Reason  for Exam (SYMPTOM  OR DIAGNOSIS REQUIRED)    Answer:   L phalanx fracture    Order Specific Question:   Preferred imaging location?    Answer:   12/25/2020    Order Specific Question:   Is patient pregnant?    Answer:   No   No orders of the defined types were placed in this encounter.    Discussed warning signs or symptoms. Please see discharge instructions. Patient expresses understanding.   The above documentation has been reviewed and is accurate and complete [facility]les, M.D.

## 2019-12-24 ENCOUNTER — Ambulatory Visit (INDEPENDENT_AMBULATORY_CARE_PROVIDER_SITE_OTHER): Payer: PRIVATE HEALTH INSURANCE | Admitting: General Practice

## 2019-12-24 ENCOUNTER — Other Ambulatory Visit: Payer: Self-pay

## 2019-12-24 ENCOUNTER — Ambulatory Visit (INDEPENDENT_AMBULATORY_CARE_PROVIDER_SITE_OTHER): Payer: PRIVATE HEALTH INSURANCE | Admitting: Family Medicine

## 2019-12-24 ENCOUNTER — Ambulatory Visit (INDEPENDENT_AMBULATORY_CARE_PROVIDER_SITE_OTHER): Payer: PRIVATE HEALTH INSURANCE

## 2019-12-24 VITALS — BP 102/74 | HR 64 | Ht 69.0 in

## 2019-12-24 DIAGNOSIS — D6859 Other primary thrombophilia: Secondary | ICD-10-CM | POA: Diagnosis not present

## 2019-12-24 DIAGNOSIS — S92515A Nondisplaced fracture of proximal phalanx of left lesser toe(s), initial encounter for closed fracture: Secondary | ICD-10-CM

## 2019-12-24 LAB — POCT INR: INR: 2.9 (ref 2.0–3.0)

## 2019-12-24 NOTE — Progress Notes (Signed)
Medical screening examination/treatment/procedure(s) were performed by non-physician practitioner and as supervising physician I was immediately available for consultation/collaboration. I agree with above. Molleigh Huot, MD   

## 2019-12-24 NOTE — Patient Instructions (Signed)
Thank you for coming in today.  Plan to protect the toe.  The most vunerable time is when you are barefoot and the toe is by itself.   Bring tape.   Try water shoes.  Have fun.   Recheck in about 3 weeks.

## 2019-12-24 NOTE — Patient Instructions (Addendum)
Pre visit review using our clinic review tool, if applicable. No additional management support is needed unless otherwise documented below in the visit note.  Continue to take 1 tablet daily except 1/2 on Wednesdays and Saturdays.  Re-check in 6 to 8 weeks per patient request.    

## 2019-12-25 NOTE — Progress Notes (Signed)
Toe fracture looks stable

## 2020-01-13 NOTE — Progress Notes (Signed)
   I, Christoper Fabian, LAT, ATC, am serving as scribe for Dr. [name].  Melinda Benton is a 60 y.o. female who presents to [facility] at [facility] today for f/u L 5th toe fx that she "stubbed" on a piece of furniture on 11/30/19. Pt had a tropical vacation on 12/26/19. Pt was last seen by Dr. Denyse Amass on 12/24/19 and was advised to protect toe while on vacation. Today, pt reports that her L 5th toe con't to be tender.  She states that she has a new bruise on the top of her L foot in line w/ her 3rd toe.  She buddy taped her toe while she was on vacation and significantly limited her walking.   Dx imaging: 12/24/19 L foot XR  12/02/19 L foot XR  Pertinent review of systems: No fevers or chills  Relevant historical information: History of DVT on anticoagulation   Exam:  BP 124/80 (BP Location: Right Arm, Patient Position: Sitting, Cuff Size: Normal)   Pulse 66   Ht 5\' 9"  (1.753 m)   Wt 172 lb 6.4 oz (78.2 kg)   LMP 05/01/2012   SpO2 99%   BMI 25.46 kg/m  General: Well Developed, well nourished, and in no acute distress.   MSK: Left foot normal-appearing tender palpation fifth toe.  Pulses cap refill and sensation intact distally.    Lab and Radiology Results X-ray images left foot obtained today personally and independently interpreted. Oblique fracture present through the proximal fifth phalanx. Slight displacement and fracture compared to prior x-ray.  No significant callus formation. Await formal radiology review   Assessment and Plan: 60 y.o. female with left fifth toe fracture.  Fracture originally occurred approximately 5 weeks ago.  She still is having some moderate pain and now x-ray shows what looks like the fracture change in displacement slightly without good callus formation.  Plan for continued immobilization and recheck in 2 weeks.  Discussed that if this is worsening or does not heal appropriately may benefit from surgical consultation.   PDMP  not reviewed this encounter. Orders Placed This Encounter  Procedures  . DG Foot Complete Left    Standing Status:   Future    Number of Occurrences:   1    Standing Expiration Date:   02/14/2020    Order Specific Question:   Reason for Exam (SYMPTOM  OR DIAGNOSIS REQUIRED)    Answer:   L foot pain    Order Specific Question:   Is patient pregnant?    Answer:   No    Order Specific Question:   Preferred imaging location?    Answer:   04/13/2020   No orders of the defined types were placed in this encounter.    Discussed warning signs or symptoms. Please see discharge instructions. Patient expresses understanding.   The above documentation has been reviewed and is accurate and complete Kyra Searles, M.D.

## 2020-01-14 ENCOUNTER — Encounter: Payer: Self-pay | Admitting: Family Medicine

## 2020-01-14 ENCOUNTER — Ambulatory Visit (INDEPENDENT_AMBULATORY_CARE_PROVIDER_SITE_OTHER): Payer: PRIVATE HEALTH INSURANCE | Admitting: Family Medicine

## 2020-01-14 ENCOUNTER — Ambulatory Visit (INDEPENDENT_AMBULATORY_CARE_PROVIDER_SITE_OTHER): Payer: PRIVATE HEALTH INSURANCE

## 2020-01-14 ENCOUNTER — Other Ambulatory Visit: Payer: Self-pay

## 2020-01-14 VITALS — BP 124/80 | HR 66 | Ht 69.0 in | Wt 172.4 lb

## 2020-01-14 DIAGNOSIS — S92515A Nondisplaced fracture of proximal phalanx of left lesser toe(s), initial encounter for closed fracture: Secondary | ICD-10-CM | POA: Diagnosis not present

## 2020-01-14 NOTE — Patient Instructions (Addendum)
Thank you for coming in today.  Please get an Xray today before you leave  Recheck in about 2 weeks.   Ok to continue the post op shoe.   Return sooner if needed.

## 2020-01-15 NOTE — Progress Notes (Signed)
Radiology thinks the fracture looks about the same.  No evidence of bony healing present.  If not better in 2 or 4 more weeks we may want to consider surgery or even a bone stimulator.

## 2020-01-27 NOTE — Progress Notes (Signed)
° °  I, Christoper Fabian, LAT, ATC, am serving as scribe for Dr. [name].  Melinda Benton is a 60 y.o. female who presents to [facility] at [facility] today for f/u of L 5th toe fx that she suffered when she "stubbed" her toe on a piece of furniture on 11/30/19.  She was last seen by Dr. Denyse Amass on 01/14/20 for f/u and was advised to con't wearing her post-op shoe.  Since her last visit, pt reports foot is feeling better. Pt has been compliant w/ wearing post-op shoe, buddy taping toe, and resting. Pt increased calcium intake.  Diagnostic testing: L foot XR- 01/14/20, 12/24/19, 12/02/19   Pertinent review of systems: No fevers or chills  Relevant historical information: Osteopenia.  Thyroid disease.   Exam:  BP 136/90 (BP Location: Right Arm, Patient Position: Sitting, Cuff Size: Normal)    Pulse 70    Ht 5\' 9"  (1.753 m)    Wt 171 lb (77.6 kg)    LMP 05/01/2012    SpO2 99%    BMI 25.25 kg/m  General: Well Developed, well nourished, and in no acute distress.   MSK: Left foot fifth toe is nontender.    Lab and Radiology Results  X-ray images left foot obtained today personally and independently interpreted. Oblique fracture through proximal fifth phalanx present with no callus formation.  No change in angulation or displacement. Wait for radiology review    Assessment and Plan: 60 y.o. female with fifth toe fracture originally identified in November 29.  Ongoing approximately 2 months at this point.   Clinically Melinda Benton is doing well but she is having delayed bone healing seen on x-ray.  She is not yet at the point where we can declare nonunion however I am fearful that we will be present in 1 month from now.  Plan to assess calcium and vitamin D based on her history of osteopenia.  Plan on rechecking in 2 weeks.  Start process for considering surgical options or bone stimulator if nonunion in 1 more month.   PDMP not reviewed this encounter. Orders Placed This  Encounter  Procedures   DG Foot Complete Left    Standing Status:   Future    Number of Occurrences:   1    Standing Expiration Date:   01/27/2021    Order Specific Question:   Reason for Exam (SYMPTOM  OR DIAGNOSIS REQUIRED)    Answer:   Left foot pain    Order Specific Question:   Preferred imaging location?    Answer:   01/29/2021    Order Specific Question:   Is patient pregnant?    Answer:   No   Vitamin D (25 hydroxy)    Standing Status:   Future    Number of Occurrences:   1    Standing Expiration Date:   01/27/2021   Comprehensive metabolic panel    Standing Status:   Future    Number of Occurrences:   1    Standing Expiration Date:   01/27/2021   No orders of the defined types were placed in this encounter.    Discussed warning signs or symptoms. Please see discharge instructions. Patient expresses understanding.   The above documentation has been reviewed and is accurate and complete 01/29/2021, M.D.

## 2020-01-28 ENCOUNTER — Ambulatory Visit (INDEPENDENT_AMBULATORY_CARE_PROVIDER_SITE_OTHER): Payer: PRIVATE HEALTH INSURANCE

## 2020-01-28 ENCOUNTER — Ambulatory Visit (INDEPENDENT_AMBULATORY_CARE_PROVIDER_SITE_OTHER): Payer: PRIVATE HEALTH INSURANCE | Admitting: Family Medicine

## 2020-01-28 ENCOUNTER — Other Ambulatory Visit: Payer: Self-pay

## 2020-01-28 VITALS — BP 136/90 | HR 70 | Ht 69.0 in | Wt 171.0 lb

## 2020-01-28 DIAGNOSIS — M79672 Pain in left foot: Secondary | ICD-10-CM

## 2020-01-28 DIAGNOSIS — M8588 Other specified disorders of bone density and structure, other site: Secondary | ICD-10-CM | POA: Diagnosis not present

## 2020-01-28 DIAGNOSIS — M85851 Other specified disorders of bone density and structure, right thigh: Secondary | ICD-10-CM | POA: Diagnosis not present

## 2020-01-28 DIAGNOSIS — M85852 Other specified disorders of bone density and structure, left thigh: Secondary | ICD-10-CM

## 2020-01-28 DIAGNOSIS — S92515A Nondisplaced fracture of proximal phalanx of left lesser toe(s), initial encounter for closed fracture: Secondary | ICD-10-CM | POA: Diagnosis not present

## 2020-01-28 LAB — COMPREHENSIVE METABOLIC PANEL
ALT: 12 U/L (ref 0–35)
AST: 16 U/L (ref 0–37)
Albumin: 4.6 g/dL (ref 3.5–5.2)
Alkaline Phosphatase: 48 U/L (ref 39–117)
BUN: 12 mg/dL (ref 6–23)
CO2: 29 mEq/L (ref 19–32)
Calcium: 9.6 mg/dL (ref 8.4–10.5)
Chloride: 104 mEq/L (ref 96–112)
Creatinine, Ser: 0.89 mg/dL (ref 0.40–1.20)
GFR: 70.84 mL/min (ref >60.00)
Glucose, Bld: 77 mg/dL (ref 70–99)
Potassium: 4 mEq/L (ref 3.5–5.1)
Sodium: 139 mEq/L (ref 135–145)
Total Bilirubin: 0.6 mg/dL (ref 0.2–1.2)
Total Protein: 7.1 g/dL (ref 6.0–8.3)

## 2020-01-28 LAB — VITAMIN D 25 HYDROXY (VIT D DEFICIENCY, FRACTURES): VITD: 43.99 ng/mL (ref 30.00–100.00)

## 2020-01-28 NOTE — Patient Instructions (Addendum)
Thank you for coming in today.  Recheck in about 2 weeks.  Keep me updated   If you can find that old bone stimulator let me know.  Probably ok to use.   Please get labs today before you leave   Get a Steel Turf Toe insole.  Do a Microbiologist for [employer]

## 2020-01-28 NOTE — Progress Notes (Signed)
Calcium and vitamin D levels are normal. Liver enzymes have returned to normal from 6 months ago.

## 2020-01-29 NOTE — Progress Notes (Signed)
X-ray images of the foot show unchanged appearance of fracture with lack of callus.  We will give this another month and if not better then proceed with further care.  Recheck in 2 weeks.

## 2020-02-13 ENCOUNTER — Other Ambulatory Visit: Payer: Self-pay | Admitting: Nurse Practitioner

## 2020-02-13 DIAGNOSIS — Z7901 Long term (current) use of anticoagulants: Secondary | ICD-10-CM

## 2020-02-14 NOTE — Progress Notes (Deleted)
   I, Christoper Fabian, LAT, ATC, am serving as scribe for Dr. [name].  Melinda Benton is a 60 y.o. female who presents to [facility] at [facility] today for f/u of L 5th toe fx that occurred on 11/30/19 when she stubbed her toe on a piece of furntiture.  She was last seen by Dr. Denyse Amass on 01/28/20 and noted improvement in her R 5th toe pain and was advised to look into purchasing a steel turf toe insole.  She was also advised that a bone stimulator could be an option.  Since her last visit, pt reports    Diagnostic imaging: L foot XR- 01/28/20, 01/14/20, 12/24/19; 12/02/19  Pertinent review of systems: ***  Relevant historical information: ***   Exam:  LMP 05/01/2012  General: Well Developed, well nourished, and in no acute distress.   MSK: ***    Lab and Radiology Results No results found for this or any previous visit (from the past 72 hour(s)). No results found.     Assessment and Plan: 60 y.o. female with ***   PDMP not reviewed this encounter. No orders of the defined types were placed in this encounter.  No orders of the defined types were placed in this encounter.    Discussed warning signs or symptoms. Please see discharge instructions. Patient expresses understanding.   ***

## 2020-02-17 ENCOUNTER — Ambulatory Visit: Payer: PRIVATE HEALTH INSURANCE | Admitting: Family Medicine

## 2020-02-17 NOTE — Progress Notes (Signed)
I, Christoper Fabian, LAT, ATC, am serving as scribe for Dr. [name].  Melinda Benton is a 60 y.o. female who presents to [facility] at [facility] today for f/u L 5th toe fx w/ delayed bone healing, that she suffered when she "stubbed" her toe on a piece of furniture on 11/30/19. Pt was last seen by Dr. Denyse Amass on 01/28/20 and was advised assess calcium, vitamin D and start process for considering surgical options or bone stimulator. Today, pt reports that her L 5th toe is feeling better.  She is having discomfort still in that toe but is not having true pain.  She is wearing regular shoes today, more of a loafer/moccasin.  She states that she injured her R 5th toe recently when she kicked/ran her toe into a 10# dumbbell and feels like that pinky toe is bothering her more than the L.  Dx imaging: 01/28/20, 01/14/20, 12/24/19, 12/02/19 L foot XR  Pertinent review of systems: No fevers or chills  Relevant historical information: Osteopenia.  Hypothyroidism.  Warfarin   Exam:  BP 114/76 (BP Location: Right Arm, Patient Position: Sitting, Cuff Size: Normal)   Pulse 74   Ht 5\' 9"  (1.753 m)   Wt 171 lb (77.6 kg)   LMP 05/01/2012   SpO2 99%   BMI 25.25 kg/m  General: Well Developed, well nourished, and in no acute distress.   MSK: Left fifth toe nontender.  Normal sensation.  Right fifth toe tender to palpation.  Normal angulation.  Capillary fill and sensation are intact distally.    Lab and Radiology Results  X-ray images bilateral feet obtained today personally and independently interpreted.  Left foot: Fracture at the proximal phalanx remains.  No significant change in angulation.  Callus formation is now present.  Slow healing is present.  Right foot: New fracture base of proximal phalanx.  This fracture is transverse and displaced.  Await formal radiology review    Assessment and Plan: 60 y.o. female with left fifth toe fracture.  This is a ongoing issue  now for almost 3 months following the original fracture.  On February 25 patient will be 90 days following the original injury.  If not fully healed at that time patient will have nonunion and will be eligible for bone stimulator.  Today she does have some callus formation per my read however radiology overread is still pending.  Recheck February 25  Right toe.  Patient unfortunately developed a new fracture to her right fifth toe.  This however is a transverse fracture that likely will be less symptomatic.  Plan for buddy tape and recheck on February 25.  \ PDMP not reviewed this encounter. Orders Placed This Encounter  Procedures  . DG Foot Complete Left    Standing Status:   Future    Number of Occurrences:   1    Standing Expiration Date:   03/17/2020    Order Specific Question:   Reason for Exam (SYMPTOM  OR DIAGNOSIS REQUIRED)    Answer:   L foot pain    Order Specific Question:   Is patient pregnant?    Answer:   No    Order Specific Question:   Preferred imaging location?    Answer:   03/19/2020  . DG Foot Complete Right    Standing Status:   Future    Number of Occurrences:   1    Standing Expiration Date:   03/17/2020    Order Specific Question:  Reason for Exam (SYMPTOM  OR DIAGNOSIS REQUIRED)    Answer:   R foot pain    Order Specific Question:   Is patient pregnant?    Answer:   No    Order Specific Question:   Preferred imaging location?    Answer:   [facility]   No orders of the defined types were placed in this encounter.    Discussed warning signs or symptoms. Please see discharge instructions. Patient expresses understanding.   The above documentation has been reviewed and is accurate and complete [name], M.D.

## 2020-02-18 ENCOUNTER — Ambulatory Visit (INDEPENDENT_AMBULATORY_CARE_PROVIDER_SITE_OTHER): Payer: PRIVATE HEALTH INSURANCE

## 2020-02-18 ENCOUNTER — Encounter: Payer: Self-pay | Admitting: Family Medicine

## 2020-02-18 ENCOUNTER — Ambulatory Visit (INDEPENDENT_AMBULATORY_CARE_PROVIDER_SITE_OTHER): Payer: PRIVATE HEALTH INSURANCE | Admitting: Family Medicine

## 2020-02-18 ENCOUNTER — Ambulatory Visit (INDEPENDENT_AMBULATORY_CARE_PROVIDER_SITE_OTHER): Payer: PRIVATE HEALTH INSURANCE | Admitting: General Practice

## 2020-02-18 ENCOUNTER — Other Ambulatory Visit: Payer: Self-pay

## 2020-02-18 VITALS — BP 114/76 | HR 74 | Ht 69.0 in | Wt 171.0 lb

## 2020-02-18 DIAGNOSIS — M79671 Pain in right foot: Secondary | ICD-10-CM

## 2020-02-18 DIAGNOSIS — S92515A Nondisplaced fracture of proximal phalanx of left lesser toe(s), initial encounter for closed fracture: Secondary | ICD-10-CM

## 2020-02-18 DIAGNOSIS — D6859 Other primary thrombophilia: Secondary | ICD-10-CM

## 2020-02-18 DIAGNOSIS — S92514A Nondisplaced fracture of proximal phalanx of right lesser toe(s), initial encounter for closed fracture: Secondary | ICD-10-CM | POA: Diagnosis not present

## 2020-02-18 LAB — POCT INR: INR: 1.9 — AB (ref 2.0–3.0)

## 2020-02-18 NOTE — Patient Instructions (Signed)
Recheck Feb 25th.  Use the post op shoe if needed on the right foot. If not needed ok to continue buddy tape.   Continue the calcium and Vit d  Radiology reads will come back tomorrow likely.

## 2020-02-18 NOTE — Progress Notes (Signed)
Medical screening examination/treatment/procedure(s) were performed by non-physician practitioner and as supervising physician I was immediately available for consultation/collaboration. I agree with above. Aitan Rossbach, MD   

## 2020-02-18 NOTE — Patient Instructions (Addendum)
Pre visit review using our clinic review tool, if applicable. No additional management support is needed unless otherwise documented below in the visit note.  Take 9 mg (1 1/2) tablets today and then continue to take 1 tablet daily except 1/2 on Wednesdays and Saturdays.  Re-check in 6 to 8 weeks per patient request.

## 2020-02-20 NOTE — Progress Notes (Signed)
X-ray left foot shows some callus but not a whole lot of callus formation at the fracture.

## 2020-02-20 NOTE — Progress Notes (Signed)
X-ray right foot shows a fracture of the fifth toe

## 2020-02-27 NOTE — Progress Notes (Signed)
I, Christoper Fabian, LAT, ATC, am serving as scribe for Dr. [name].  Melinda Benton is a 60 y.o. female who presents to [facility] at [facility] today for f/u of B 5th toe fractures.  She was initially being seen for the L 5th toe that she fractured on 11/30/19 when she stubbed her toe on a piece of furniture.  She then suffered another fractured 5th toe on the R more recently when she kicked a dumbbell w/ her R foot.  She was last seen by Dr. Denyse Amass on 02/18/20 and was advised to buddy tape her toes and use the post-op shoe if needed for her R foot.  She was advised to con't her calcium and vit D supplementation.  Since her last visit, pt reports no change in her symptoms.  Since we last saw her, pt states that she had the flu and has been in bed most of the time.  Dx imaging: L foot XR- 02/18/20, 01/28/20, 01/14/20, 12/24/19, 12/02/19; R foot XR- 02/18/20   Pertinent review of systems: No fevers or chills  Relevant historical information: Osteopenia.  History of pulmonary embolus on warfarin.   Exam:  BP 120/86 (BP Location: Right Arm, Patient Position: Sitting, Cuff Size: Normal)   Pulse 84   Ht 5\' 8"  (1.727 m)   Wt 165 lb 12.8 oz (75.2 kg)   LMP 05/01/2012   SpO2 97%   BMI 25.21 kg/m  General: Well Developed, well nourished, and in no acute distress.   MSK: Right foot tender to palpation fifth toe Left foot tender palpation minimally left fifth toe.    Lab and Radiology Results  X-ray images bilateral feet obtained today personally and independently interpreted  Right foot: Insertion fracture proximal fifth phalanx. No significant angulation or displacement. No callus formation.  Left foot: Continued oblique/spiral fracture fifth toe proximal phalanx. Fracture line evident with minimal callus formation. Nonunion  Await formal radiology review     Assessment and Plan: 60 y.o. female with left fifth toe fracture. Fracture is now 34 weeks old and it has  not healed on x-ray. Officially now a nonunion.  We will proceed with a bone stimulator. Fortunately clinically she is doing reasonably well so we will continue conservative management and reassess in 1 month.  Acute fracture right fifth toe now approximately 96 weeks old. Not significantly changing positioning on x-ray. Continue buddy taping and conservative management. Recheck in 2 to 4 weeks.   PDMP not reviewed this encounter. Orders Placed This Encounter  Procedures  . DG Foot Complete Right    Standing Status:   Future    Number of Occurrences:   1    Standing Expiration Date:   02/27/2021    Order Specific Question:   Reason for Exam (SYMPTOM  OR DIAGNOSIS REQUIRED)    Answer:   eval fx    Order Specific Question:   Is patient pregnant?    Answer:   No    Order Specific Question:   Preferred imaging location?    Answer:   03/01/2021  . DG Foot Complete Left    Standing Status:   Future    Number of Occurrences:   1    Standing Expiration Date:   02/27/2021    Order Specific Question:   Reason for Exam (SYMPTOM  OR DIAGNOSIS REQUIRED)    Answer:   eval fx    Order Specific Question:   Is patient pregnant?  Answer:   No    Order Specific Question:   Preferred imaging location?    Answer:   [facility]   No orders of the defined types were placed in this encounter.    Discussed warning signs or symptoms. Please see discharge instructions. Patient expresses understanding.   The above documentation has been reviewed and is accurate and complete [name], M.D.

## 2020-02-28 ENCOUNTER — Encounter: Payer: Self-pay | Admitting: Family Medicine

## 2020-02-28 ENCOUNTER — Ambulatory Visit (INDEPENDENT_AMBULATORY_CARE_PROVIDER_SITE_OTHER): Payer: PRIVATE HEALTH INSURANCE | Admitting: Family Medicine

## 2020-02-28 ENCOUNTER — Ambulatory Visit (INDEPENDENT_AMBULATORY_CARE_PROVIDER_SITE_OTHER): Payer: PRIVATE HEALTH INSURANCE

## 2020-02-28 ENCOUNTER — Other Ambulatory Visit: Payer: Self-pay

## 2020-02-28 VITALS — BP 120/86 | HR 84 | Ht 68.0 in | Wt 165.8 lb

## 2020-02-28 DIAGNOSIS — S92515A Nondisplaced fracture of proximal phalanx of left lesser toe(s), initial encounter for closed fracture: Secondary | ICD-10-CM | POA: Diagnosis not present

## 2020-02-28 DIAGNOSIS — S92514A Nondisplaced fracture of proximal phalanx of right lesser toe(s), initial encounter for closed fracture: Secondary | ICD-10-CM

## 2020-02-28 DIAGNOSIS — S92515K Nondisplaced fracture of proximal phalanx of left lesser toe(s), subsequent encounter for fracture with nonunion: Secondary | ICD-10-CM

## 2020-02-28 NOTE — Patient Instructions (Signed)
Thank you for coming in today.  Please get an Xray today before you leave  You should hear about that bone stimulator in about 1 week. Let me know if you do not.   Recheck in 1 month unless you have a problem.

## 2020-03-02 NOTE — Progress Notes (Signed)
X-ray left foot fracture unchanged 5th toe.

## 2020-03-02 NOTE — Progress Notes (Signed)
X-ray right foot shows unchanged fracture 5th toe.

## 2020-03-12 ENCOUNTER — Telehealth: Payer: Self-pay | Admitting: Family Medicine

## 2020-03-12 DIAGNOSIS — S92515K Nondisplaced fracture of proximal phalanx of left lesser toe(s), subsequent encounter for fracture with nonunion: Secondary | ICD-10-CM

## 2020-03-12 MED ORDER — AMBULATORY NON FORMULARY MEDICATION: 0 refills | Status: DC

## 2020-03-12 NOTE — Telephone Encounter (Signed)
Bone stimulator

## 2020-03-13 ENCOUNTER — Other Ambulatory Visit: Payer: Self-pay | Admitting: Nurse Practitioner

## 2020-03-13 DIAGNOSIS — Z7901 Long term (current) use of anticoagulants: Secondary | ICD-10-CM

## 2020-03-27 ENCOUNTER — Ambulatory Visit: Payer: PRIVATE HEALTH INSURANCE | Admitting: Family Medicine

## 2020-03-31 ENCOUNTER — Ambulatory Visit: Payer: PRIVATE HEALTH INSURANCE

## 2020-04-06 ENCOUNTER — Other Ambulatory Visit: Payer: Self-pay | Admitting: Nurse Practitioner

## 2020-04-06 DIAGNOSIS — Z7901 Long term (current) use of anticoagulants: Secondary | ICD-10-CM

## 2020-04-07 NOTE — Telephone Encounter (Signed)
Patient is scheduled 05/11@ 11:00

## 2020-04-09 ENCOUNTER — Other Ambulatory Visit: Payer: Self-pay

## 2020-04-09 ENCOUNTER — Other Ambulatory Visit: Payer: Self-pay | Admitting: General Practice

## 2020-04-09 ENCOUNTER — Ambulatory Visit: Payer: PRIVATE HEALTH INSURANCE | Admitting: General Practice

## 2020-04-09 DIAGNOSIS — Z7901 Long term (current) use of anticoagulants: Secondary | ICD-10-CM

## 2020-04-09 DIAGNOSIS — D6859 Other primary thrombophilia: Secondary | ICD-10-CM

## 2020-04-09 LAB — POCT INR: INR: 2 (ref 2.0–3.0)

## 2020-04-09 MED ORDER — WARFARIN SODIUM 6 MG PO TABS: ORAL_TABLET | ORAL | 1 refills | Status: DC

## 2020-04-09 NOTE — Progress Notes (Signed)
Melinda Benton, am serving as a Neurosurgeon for Dr. [name].  Melinda Benton is a 60 y.o. female who presents to [facility] at [facility] today for f/u of B 5th toe fractures.  She was initially being seen for the L 5th toe that she fractured on 11/30/19 when she stubbed her toe on a piece of furniture.  She then suffered another fractured 5th toe on the R more recently when she kicked a dumbbell w/ her R foot.  She was last seen by Dr. Denyse Amass on 02/28/20 and reported no change in her symptoms.  A referral/order was placed for a bone stimulator for her L 5th toe due to non-union.  She was also advised to con't conservative measures for her R toe.  Since her last visit, pt reports that the L foot feels okay/better than last visit but the right foot is still the same.   Diagnostic imaging: L foot XR- 02/28/20, 02/18/20, 01/28/20, 01/14/20, 12/24/19 and 12/02/19; R foot XR- 02/28/20, 02/18/20  Pertinent review of systems: No fevers or chills  Relevant historical information: Prediabetes anticoagulation with warfarin   Exam:  BP 118/78 (BP Location: Left Arm, Patient Position: Sitting, Cuff Size: Normal)   Pulse 73   Ht 5\' 8"  (1.727 m)   Wt 173 lb (78.5 kg)   LMP 05/01/2012   SpO2 99%   BMI 26.30 kg/m  General: Well Developed, well nourished, and in no acute distress.   MSK: Left foot nontender at fifth proximal phalanx  Right foot tender palpation of fifth proximal phalanx    Lab and Radiology Results  X-ray images bilateral feet obtained today personally and independently interpreted  Left foot: Oblique fracture fifth proximal phalanx with no callus formation.  Nonunion.  Right foot: Transverse fracture fifth proximal phalanx.  No callus formation.    Assessment and Plan: 60 y.o. female with  Left fifth toe fracture at proximal phalanx.  Fracture occurred on November 29.  She has been treated for months now with clinical improvement but no significant  radiological improvement.  Bone stimulator was denied by insurance.  Clinically she is doing reasonably well but we are both frustrated by the lack of full progress.  She still does require special care and is unable to advance her activity as tolerated because of her left foot.  Discussed options.  I think is worth at least having a conversation with surgeon regarding her surgical options.  She is not convinced that she wants surgery but I think a second opinion/surgical opinion is reasonable.  Plan to refer to podiatry (Dr. December 01) to discuss surgical options and to ensure that no other reasonable conservative management options are left.  Right fifth toe fracture proximal phalanx.  Somewhat ironically she also fractured her right fifth toe February 15.  Clinically this is slowly improving.  However on x-ray she does not have a lot of callus formation either.  Continue conservative management with this.   PDMP not reviewed this encounter. Orders Placed This Encounter  Procedures  . DG Foot Complete Right    Standing Status:   Future    Number of Occurrences:   1    Standing Expiration Date:   04/10/2021    Order Specific Question:   Reason for Exam (SYMPTOM  OR DIAGNOSIS REQUIRED)    Answer:   Right foot fracture    Order Specific Question:   Preferred imaging location?    Answer:   06/10/2021  Order Specific Question:   Is patient pregnant?    Answer:   No  . DG Foot Complete Left    Standing Status:   Future    Number of Occurrences:   1    Standing Expiration Date:   04/10/2021    Order Specific Question:   Reason for Exam (SYMPTOM  OR DIAGNOSIS REQUIRED)    Answer:   left foot pain    Order Specific Question:   Preferred imaging location?    Answer:   [facility]    Order Specific Question:   Is patient pregnant?    Answer:   No  . Ambulatory referral to Podiatry    Referral Priority:   Routine    Referral Type:   Consultation    Referral Reason:   Specialty  Services Required    Referred to Provider:   Vivi Barrack, DPM    Requested Specialty:   Podiatry    Number of Visits Requested:   1   No orders of the defined types were placed in this encounter.    Discussed warning signs or symptoms. Please see discharge instructions. Patient expresses understanding.   The above documentation has been reviewed and is accurate and complete [name], M.D.

## 2020-04-09 NOTE — Progress Notes (Signed)
Medical screening examination/treatment/procedure(s) were performed by non-physician practitioner and as supervising physician I was immediately available for consultation/collaboration. I agree with above. Terryon Pineiro, MD   

## 2020-04-09 NOTE — Patient Instructions (Addendum)
Pre visit review using our clinic review tool, if applicable. No additional management support is needed unless otherwise documented below in the visit note.  Continue to take 1 tablet daily except 1/2 on Wednesdays and Saturdays.  Re-check in 6 to 8 weeks per patient request.

## 2020-04-10 ENCOUNTER — Ambulatory Visit: Payer: PRIVATE HEALTH INSURANCE | Admitting: Family Medicine

## 2020-04-10 ENCOUNTER — Ambulatory Visit (INDEPENDENT_AMBULATORY_CARE_PROVIDER_SITE_OTHER): Payer: PRIVATE HEALTH INSURANCE

## 2020-04-10 ENCOUNTER — Encounter: Payer: Self-pay | Admitting: Family Medicine

## 2020-04-10 VITALS — BP 118/78 | HR 73 | Ht 68.0 in | Wt 173.0 lb

## 2020-04-10 DIAGNOSIS — M79671 Pain in right foot: Secondary | ICD-10-CM

## 2020-04-10 DIAGNOSIS — S92515K Nondisplaced fracture of proximal phalanx of left lesser toe(s), subsequent encounter for fracture with nonunion: Secondary | ICD-10-CM

## 2020-04-10 DIAGNOSIS — M79672 Pain in left foot: Secondary | ICD-10-CM | POA: Diagnosis not present

## 2020-04-10 DIAGNOSIS — S92514D Nondisplaced fracture of proximal phalanx of right lesser toe(s), subsequent encounter for fracture with routine healing: Secondary | ICD-10-CM | POA: Diagnosis not present

## 2020-04-10 NOTE — Patient Instructions (Addendum)
Good to see you   See me again in 1 month.   Continue current care.

## 2020-04-13 NOTE — Progress Notes (Signed)
Left toe fracture looks a little worse.

## 2020-04-13 NOTE — Progress Notes (Signed)
Right toe fracture shows no signs of healing.

## 2020-04-21 ENCOUNTER — Ambulatory Visit: Payer: Self-pay

## 2020-04-21 ENCOUNTER — Ambulatory Visit: Payer: PRIVATE HEALTH INSURANCE | Admitting: Podiatry

## 2020-04-21 ENCOUNTER — Other Ambulatory Visit: Payer: Self-pay

## 2020-04-21 ENCOUNTER — Encounter: Payer: Self-pay | Admitting: Podiatry

## 2020-04-21 DIAGNOSIS — Z833 Family history of diabetes mellitus: Secondary | ICD-10-CM | POA: Insufficient documentation

## 2020-04-21 DIAGNOSIS — S92514P Nondisplaced fracture of proximal phalanx of right lesser toe(s), subsequent encounter for fracture with malunion: Secondary | ICD-10-CM

## 2020-04-21 DIAGNOSIS — S92911A Unspecified fracture of right toe(s), initial encounter for closed fracture: Secondary | ICD-10-CM

## 2020-04-21 DIAGNOSIS — S92515K Nondisplaced fracture of proximal phalanx of left lesser toe(s), subsequent encounter for fracture with nonunion: Secondary | ICD-10-CM

## 2020-04-21 DIAGNOSIS — E041 Nontoxic single thyroid nodule: Secondary | ICD-10-CM | POA: Insufficient documentation

## 2020-04-21 DIAGNOSIS — B351 Tinea unguium: Secondary | ICD-10-CM | POA: Diagnosis not present

## 2020-04-21 DIAGNOSIS — R635 Abnormal weight gain: Secondary | ICD-10-CM | POA: Insufficient documentation

## 2020-04-21 DIAGNOSIS — E785 Hyperlipidemia, unspecified: Secondary | ICD-10-CM | POA: Insufficient documentation

## 2020-04-21 NOTE — Patient Instructions (Signed)
I have ordered a medication for you that will come from [facility] in [place]. They should be calling you to verify insurance and will mail the medication to you. If you live close by then you can go by their pharmacy to pick up the medication. Their phone number is [phone]. If you do not hear from them in the next few days, please give us a call at [phone].   

## 2020-04-26 NOTE — Progress Notes (Signed)
Subjective:   Patient ID: Melinda Benton, female   DOB: 60 y.o.   MRN: [id number]   HPI 60 year old female presents the office today for concerns of nonhealing fractures of bilateral fifth toes.  She states around Thanksgiving she injured her left fifth toe.  In January 2022 she struck her right fifth toe on a weight that she looking for in her house.  She did not fracture the fifth toe.  She states that both toes and not been healing.  She has followed up with Dr. Denyse Amass who has referred her here for further evaluation and discussed possible surgical intervention but she is not interested in surgery at this time.  Also she states that both of her big toenails she is noted some bruising after hiking.  She is on Coumadin.  The nails to become somewhat thickened discolored.   Review of Systems  All other systems reviewed and are negative.  Past Medical History:  Diagnosis Date  . Arthritis   . Clotting disorder (HCC)   . H/O blood clots   . History of chicken pox   . Migraines   . Mitral valve prolapse   . Thyroid disease     Past Surgical History:  Procedure Laterality Date  . bladder sur    . CESAREAN SECTION     2  . CHOLECYSTECTOMY N/A 07/07/2019   Procedure: LAPAROSCOPIC CHOLECYSTECTOMY WITH INTRAOPERATIVE CHOLANGIOGRAM;  Surgeon: Griselda Miner, MD;  Location: [facility];  Service: General;  Laterality: N/A;  . ELBOW SURGERY  2009  . knee repla Left 07/2016   partial knee replacement   . ROTATOR CUFF REPAIR  2011  . TONSILLECTOMY  ?1968     Current Outpatient Medications:  .  Naltrexone-buPROPion HCl ER (CONTRAVE) 8-90 MG TB12, 1 tablet once a day for week 1, 1 tab BID for week 2, 2 tab AM and 1PM for week 3 and 2 tabs BID week r, Disp: , Rfl:  .  rosuvastatin (CRESTOR) 5 MG tablet, 1 tablet, Disp: , Rfl:  .  AMBULATORY NON FORMULARY MEDICATION, Bone stimulator. Use as directed for fracture, Disp: 1 each, Rfl: 0 .  buPROPion (WELLBUTRIN XL) 300 MG 24 hr tablet, Take 1 tablet  (300 mg total) by mouth daily. Need Office Visit for additional refills, Disp: 90 tablet, Rfl: 0 .  Cholecalciferol (VITAMIN D) 50 MCG (2000 UT) tablet, 1 tablet, Disp: , Rfl:  .  levothyroxine (SYNTHROID, LEVOTHROID) 88 MCG tablet, Take 88 mcg by mouth daily before breakfast., Disp: , Rfl:  .  metoprolol succinate (TOPROL-XL) 25 MG 24 hr tablet, Take 1 tablet (25 mg total) by mouth daily as needed., Disp: 30 tablet, Rfl: 5 .  Oyster Shell Calcium 500 MG TABS, 1 tablet with meals, Disp: , Rfl:  .  pantoprazole (PROTONIX) 20 MG tablet, Take 20 mg by mouth daily as needed for heartburn or indigestion., Disp: , Rfl:  .  warfarin (COUMADIN) 6 MG tablet, Take 1 tablet daily or Take as directed by anticoagulation clinic, Disp: 105 tablet, Rfl: 1  No Known Allergies       Objective:  Physical Exam  General: AAO x3, NAD  Dermatological: Nail polish is present so unable to fully evaluate the entire toenail however the nails to be hypertrophic and dystrophic and brown discoloration bilateral hallux toenails as well as the left fifth toenail.  No pain.  No edema, erythema.  Vascular: Dorsalis Pedis artery and Posterior Tibial artery pedal pulses are 2/4 bilateral with immedate  capillary fill time.  There is no pain with calf compression, swelling, warmth, erythema.   Neruologic: Grossly intact via light touch bilateral.    Musculoskeletal: Mild edema to the fifth digit but no significant discomfort.  Muscular strength 5/5 in all groups tested bilateral.  Gait: Unassisted, Nonantalgic.       Assessment:   Nonunion bilateral fifth digit fractures; onychomycosis/onychodystrophy    Plan:  -Treatment options discussed including all alternatives, risks, and complications -Etiology of symptoms were discussed -Independent review the x-rays that she had previously which revealed nonunion of the fracture of the fifth digits. -We discussed with conservative as well surgical options.  She was found to  proceed with surgery.  When again try to order a bone stimulator for her.  Discussed wearing stiffer soled shoes. -Order compound cream today through [facility] for nail fungus to include urea to help with the dystrophy  Vivi Barrack DPM

## 2020-04-27 ENCOUNTER — Encounter: Payer: Self-pay | Admitting: Podiatry

## 2020-05-12 ENCOUNTER — Other Ambulatory Visit: Payer: Self-pay

## 2020-05-13 ENCOUNTER — Encounter: Payer: Self-pay | Admitting: Nurse Practitioner

## 2020-05-13 ENCOUNTER — Ambulatory Visit (INDEPENDENT_AMBULATORY_CARE_PROVIDER_SITE_OTHER): Payer: PRIVATE HEALTH INSURANCE | Admitting: Nurse Practitioner

## 2020-05-13 VITALS — BP 118/68 | HR 74 | Temp 98.3°F | Ht 67.75 in | Wt 175.0 lb

## 2020-05-13 DIAGNOSIS — R739 Hyperglycemia, unspecified: Secondary | ICD-10-CM

## 2020-05-13 DIAGNOSIS — Z0000 Encounter for general adult medical examination without abnormal findings: Secondary | ICD-10-CM

## 2020-05-13 DIAGNOSIS — E782 Mixed hyperlipidemia: Secondary | ICD-10-CM

## 2020-05-13 DIAGNOSIS — E039 Hypothyroidism, unspecified: Secondary | ICD-10-CM

## 2020-05-13 NOTE — Progress Notes (Signed)
Subjective:    Patient ID: Melinda Benton, female    DOB: [birth year], 60 y.o.   MRN: [id number]  Patient presents today for CPE   HPI Hypothyroidism check TSH and T4 appt with Dr. Talmage Nap 06/2020  Hyperlipidemia Repeat lipid panel Limited exercise due to foot fracture Advised to maintain DASH diet  Sexual History (orientation,birth control, marital status, STD): pelvic exam, mammogram and bone density scheduled by GYN.  Colonoscopy scheduled for 06/2020  Depression/Suicide: Depression screen [facility] 2/9 07/11/2019 11/05/2018 11/05/2018 04/03/2018 03/20/2017 05/03/2012  Decreased Interest 0 0 0 0 0 0  Down, Depressed, Hopeless 0 0 0 0 0 0  PHQ - 2 Score 0 0 0 0 0 0  Altered sleeping - 2 - - - -  Tired, decreased energy - 1 - - - -  Change in appetite - 2 - - - -  Feeling bad or failure about yourself  - 0 - - - -  Trouble concentrating - 0 - - - -  Moving slowly or fidgety/restless - 0 - - - -  Suicidal thoughts - 0 - - - -  PHQ-9 Score - 5 - - - -  Difficult doing work/chores - Somewhat difficult - - - -  Some recent data might be hidden   Vision:will schedule  Dental:will schedule  Immunizations: (TDAP, Hep C screen, Pneumovax, Influenza, zoster)  Health Maintenance  Topic Date Due  . Colon Cancer Screening  01/03/2010  . Tetanus Vaccine  01/04/2019  . Flu Shot  08/03/2020  . Mammogram  11/05/2020  . Pap Smear  10/14/2021  . COVID-19 Vaccine  Completed  . Hepatitis C Screening: USPSTF Recommendation to screen - Ages 75-79 yo.  Completed  . HIV Screening  Completed  . HPV Vaccine  Aged Out   Diet:regular Exercise: none Weight:  Wt Readings from Last 3 Encounters:  05/13/20 175 lb (79.4 kg)  04/10/20 173 lb (78.5 kg)  02/28/20 165 lb 12.8 oz (75.2 kg)   Fall Risk: Fall Risk  05/13/2020 07/11/2019 11/05/2018 04/03/2018 03/20/2017 05/03/2012  Falls in the past year? 0 0 0 0 No No  Number falls in past yr: 0 0 - - - -  Injury with Fall? 0 0 - - - -  Risk for fall due to  : No Fall Risks - - - - -  Follow up Falls evaluation completed - - - - -   Medications and allergies reviewed with patient and updated if appropriate.  Patient Active Problem List   Diagnosis Date Noted  . Family history of diabetes mellitus 04/21/2020  . Hyperlipidemia 04/21/2020  . 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 Weight gain 04/21/2020  . Closed nondisplaced fracture of proximal phalanx of lesser toe of right foot 02/18/2020  . Closed nondisplaced fracture of proximal phalanx of lesser toe of left foot 12/02/2019  . Elevated LFTs 07/15/2019  . S/P laparoscopic cholecystectomy 07/11/2019  . Acute cholecystitis 07/06/2019  . Osteopenia of femoral neck, bilateral 11/13/2018  . Osteopenia of lumbar spine 11/13/2018  . Family history of colon cancer 11/05/2018  . Hypothyroidism 11/05/2018  . Chronic left shoulder pain 03/06/2018  . GAD (generalized anxiety disorder) 03/20/2017  . 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 Chronic pain of right knee 06/07/2016  . Spider varicose veins 09/01/2015  . Palpitations 10/29/2013  . Arthritis 10/09/2013  . Anticoagulant long-term use 08/04/2010  . Pulmonary embolus (HCC) 02/15/2010  . Hypercoagulable state (HCC) 02/15/2010    Current Outpatient  Medications on File Prior to Visit  Medication Sig Dispense Refill  . buPROPion (WELLBUTRIN XL) 300 MG 24 hr tablet Take 1 tablet (300 mg total) by mouth daily. Need Office Visit for additional refills 90 tablet 0  . levothyroxine (SYNTHROID, LEVOTHROID) 88 MCG tablet Take 88 mcg by mouth daily before breakfast.    . Naltrexone-buPROPion HCl ER 8-90 MG TB12 Take 1 tablet by mouth in the morning and at bedtime.    . NON FORMULARY Antifungal (nail)-#1    . pantoprazole (PROTONIX) 20 MG tablet Take 20 mg by mouth daily as needed for heartburn or indigestion.    . rosuvastatin (CRESTOR) 5 MG tablet 1 tablet    . warfarin (COUMADIN) 6 MG tablet Take 1 tablet daily or Take as  directed by anticoagulation clinic 105 tablet 1  . AMBULATORY NON FORMULARY MEDICATION Bone stimulator. Use as directed for fracture 1 each 0   No current facility-administered medications on file prior to visit.    Past Medical History:  Diagnosis Date  . Arthritis   . Clotting disorder (HCC)   . Cough 05/19/2014  . H/O blood clots   . History of chicken pox   . Migraines   . Mitral valve prolapse   . Thyroid disease     Past Surgical History:  Procedure Laterality Date  . bladder sur    . CESAREAN SECTION     2  . CHOLECYSTECTOMY N/A 07/07/2019   Procedure: LAPAROSCOPIC CHOLECYSTECTOMY WITH INTRAOPERATIVE CHOLANGIOGRAM;  Surgeon: Griselda Mineroth, Paul III, MD;  Location: [facility];  Service: General;  Laterality: N/A;  . ELBOW SURGERY  2009  . knee repla Left 07/2016   partial knee replacement   . ROTATOR CUFF REPAIR  2011  . TONSILLECTOMY  ?1968    Social History   Socioeconomic History  . Marital status: Married    Spouse name: Not on file  . Number of children: 3  . Years of education: 2916  . Highest education level: Not on file  Occupational History  . Occupation: Technical brewerinsurance investigator  Tobacco Use  . Smoking status: Never Smoker  . Smokeless tobacco: Never Used  Vaping Use  . Vaping Use: Never used  Substance and Sexual Activity  . Alcohol use: Yes    Comment: Socially  . Drug use: No  . Sexual activity: Yes  Other Topics Concern  . Not on file  Social History Narrative   Regular exercise-yes   Caffeine Use-yes   Social Determinants of Health   Financial Resource Strain: Not on file  Food Insecurity: Not on file  Transportation Needs: Not on file  Physical Activity: Not on file  Stress: Not on file  Social Connections: Not on file    Family History  Problem Relation Age of Onset  . Protein S deficiency Mother   . Clotting disorder Mother   . Heart disease Father   . Diabetes Father   . Headache Father   . Colon cancer Paternal Grandfather   . Stroke  Brother   . Diabetes Paternal Aunt   . Diabetes Paternal Uncle   . Colon cancer Paternal Grandmother [id number]  . Colon cancer Cousin 35       paternal        Review of Systems  Constitutional: Negative for fever, malaise/fatigue and weight loss.  HENT: Negative for congestion and sore throat.   Eyes:       Negative for visual changes  Respiratory: Negative for cough and shortness of breath.  Cardiovascular: Negative for chest pain, palpitations and leg swelling.  Gastrointestinal: Negative for blood in stool, constipation, diarrhea and heartburn.  Genitourinary: Negative for dysuria, frequency and urgency.  Musculoskeletal: Negative for falls, joint pain and myalgias.  Skin: Negative for rash.  Neurological: Negative for dizziness, sensory change and headaches.  Endo/Heme/Allergies: Does not bruise/bleed easily.  Psychiatric/Behavioral: Negative for depression, substance abuse and suicidal ideas. The patient is not nervous/anxious.    Objective:   Vitals:   05/13/20 1130  BP: 118/68  Pulse: 74  Temp: 98.3 F (36.8 C)  SpO2: 98%   Body mass index is 26.81 kg/m.  Physical Examination:  Physical Exam Vitals reviewed.  Constitutional:      General: She is not in acute distress.    Appearance: She is well-developed.  HENT:     Right Ear: Tympanic membrane, ear canal and external ear normal.     Left Ear: Tympanic membrane, ear canal and external ear normal.  Eyes:     Extraocular Movements: Extraocular movements intact.     Conjunctiva/sclera: Conjunctivae normal.     Pupils: Pupils are equal, round, and reactive to light.  Cardiovascular:     Rate and Rhythm: Normal rate and regular rhythm.     Heart sounds: Normal heart sounds.  Pulmonary:     Effort: Pulmonary effort is normal. No respiratory distress.     Breath sounds: Normal breath sounds.  Chest:     Chest wall: No tenderness.  Abdominal:     General: Bowel sounds are normal.     Palpations: Abdomen is soft.   Genitourinary:    Comments: Deferred breast and pelvic exam to GYN per patient Musculoskeletal:        General: No swelling. Normal range of motion.     Cervical back: Normal range of motion and neck supple.     Right lower leg: No edema.     Left lower leg: No edema.  Lymphadenopathy:     Cervical: No cervical adenopathy.  Skin:    General: Skin is warm and dry.  Neurological:     Mental Status: She is alert and oriented to person, place, and time.     Deep Tendon Reflexes: Reflexes are normal and symmetric.  Psychiatric:        Mood and Affect: Mood normal.        Behavior: Behavior normal.        Thought Content: Thought content normal.    ASSESSMENT and PLAN: This visit occurred during the SARS-CoV-2 public health emergency.  Safety protocols were in place, including screening questions prior to the visit, additional usage of staff PPE, and extensive cleaning of exam room while observing appropriate contact time as indicated for disinfecting solutions.   Shalona was seen today for annual exam.  Diagnoses and all orders for this visit:  Preventative health care -     CBC w/Diff; Future  Mixed hyperlipidemia -     Lipid panel; Future  Hypothyroidism, unspecified type -     TSH; Future -     T4, free; Future  Hyperglycemia -     Hemoglobin A1c; Future      Problem List Items Addressed This Visit      Endocrine   Hypothyroidism    check TSH and T4 appt with Dr. Talmage Nap 06/2020      Relevant Orders   TSH   T4, free     Other   Hyperlipidemia    Repeat lipid panel Limited exercise  due to foot fracture Advised to maintain DASH diet      Relevant Orders   Lipid panel    Other Visit Diagnoses    Preventative health care    -  Primary   Relevant Orders   CBC w/Diff   Hyperglycemia       Relevant Orders   Hemoglobin A1c      Follow up: Return in about 1 year (around 05/13/2021) for CPE (fasting).  Alysia Penna, NP

## 2020-05-13 NOTE — Assessment & Plan Note (Addendum)
check TSH and T4 appt with Dr. Talmage Nap 06/2020

## 2020-05-13 NOTE — Patient Instructions (Signed)
Schedule fasting lab appt. Need to be fasting at least 6-8hrs prior to blood draw. Ok to drink water  Sign medical release to get records from GYN.  Preventive Care 60-60 Years Old, Female Preventive care refers to lifestyle choices and visits with your health care provider that can promote health and wellness. This includes:  A yearly physical exam. This is also called an annual wellness visit.  Regular dental and eye exams.  Immunizations.  Screening for certain conditions.  Healthy lifestyle choices, such as: ? Eating a healthy diet. ? Getting regular exercise. ? Not using drugs or products that contain nicotine and tobacco. ? Limiting alcohol use. What can I expect for my preventive care visit? Physical exam Your health care provider will check your:  Height and weight. These may be used to calculate your BMI (body mass index). BMI is a measurement that tells if you are at a healthy weight.  Heart rate and blood pressure.  Body temperature.  Skin for abnormal spots. Counseling Your health care provider may ask you questions about your:  Past medical problems.  Family's medical history.  Alcohol, tobacco, and drug use.  Emotional well-being.  Home life and relationship well-being.  Sexual activity.  Diet, exercise, and sleep habits.  Work and work Statistician.  Access to firearms.  Method of birth control.  Menstrual cycle.  Pregnancy history. What immunizations do I need? Vaccines are usually given at various ages, according to a schedule. Your health care provider will recommend vaccines for you based on your age, medical history, and lifestyle or other factors, such as travel or where you work.   What tests do I need? Blood tests  Lipid and cholesterol levels. These may be checked every 5 years, or more often if you are over 6 years old.  Hepatitis C test.  Hepatitis B test. Screening  Lung cancer screening. You may have this screening every  year starting at age 41 if you have a 30-pack-year history of smoking and currently smoke or have quit within the past 15 years.  Colorectal cancer screening. ? All adults should have this screening starting at age 80 and continuing until age 58. ? Your health care provider may recommend screening at age 53 if you are at increased risk. ? You will have tests every 1-10 years, depending on your results and the type of screening test.  Diabetes screening. ? This is done by checking your blood sugar (glucose) after you have not eaten for a while (fasting). ? You may have this done every 1-3 years.  Mammogram. ? This may be done every 1-2 years. ? Talk with your health care provider about when you should start having regular mammograms. This may depend on whether you have a family history of breast cancer.  BRCA-related cancer screening. This may be done if you have a family history of breast, ovarian, tubal, or peritoneal cancers.  Pelvic exam and Pap test. ? This may be done every 3 years starting at age 64. ? Starting at age 8, this may be done every 5 years if you have a Pap test in combination with an HPV test. Other tests  STD (sexually transmitted disease) testing, if you are at risk.  Bone density scan. This is done to screen for osteoporosis. You may have this scan if you are at high risk for osteoporosis. Talk with your health care provider about your test results, treatment options, and if necessary, the need for more tests. Follow these instructions  at home: Eating and drinking  Eat a diet that includes fresh fruits and vegetables, whole grains, lean protein, and low-fat dairy products.  Take vitamin and mineral supplements as recommended by your health care provider.  Do not drink alcohol if: ? Your health care provider tells you not to drink. ? You are pregnant, may be pregnant, or are planning to become pregnant.  If you drink alcohol: ? Limit how much you have to  0-1 drink a day. ? Be aware of how much alcohol is in your drink. In the [place] one drink equals one 12 oz bottle of beer (355 mL), one 5 oz glass of wine (148 mL), or one 1 oz glass of hard liquor (44 mL).   Lifestyle  Take daily care of your teeth and gums. Brush your teeth every morning and night with fluoride toothpaste. Floss one time each day.  Stay active. Exercise for at least 30 minutes 5 or more days each week.  Do not use any products that contain nicotine or tobacco, such as cigarettes, e-cigarettes, and chewing tobacco. If you need help quitting, ask your health care provider.  Do not use drugs.  If you are sexually active, practice safe sex. Use a condom or other form of protection to prevent STIs (sexually transmitted infections).  If you do not wish to become pregnant, use a form of birth control. If you plan to become pregnant, see your health care provider for a prepregnancy visit.  If told by your health care provider, take low-dose aspirin daily starting at age 4.  Find healthy ways to cope with stress, such as: ? Meditation, yoga, or listening to music. ? Journaling. ? Talking to a trusted person. ? Spending time with friends and family. Safety  Always wear your seat belt while driving or riding in a vehicle.  Do not drive: ? If you have been drinking alcohol. Do not ride with someone who has been drinking. ? When you are tired or distracted. ? While texting.  Wear a helmet and other protective equipment during sports activities.  If you have firearms in your house, make sure you follow all gun safety procedures. What's next?  Visit your health care provider once a year for an annual wellness visit.  Ask your health care provider how often you should have your eyes and teeth checked.  Stay up to date on all vaccines. This information is not intended to replace advice given to you by your health care provider. Make sure you discuss any questions you have  with your health care provider. Document Revised: 09/24/2019 Document Reviewed: 08/31/2017 Elsevier Patient Education  2021 [employer].

## 2020-05-13 NOTE — Assessment & Plan Note (Signed)
Repeat lipid panel Limited exercise due to foot fracture Advised to maintain DASH diet

## 2020-05-18 ENCOUNTER — Other Ambulatory Visit (INDEPENDENT_AMBULATORY_CARE_PROVIDER_SITE_OTHER): Payer: PRIVATE HEALTH INSURANCE

## 2020-05-18 ENCOUNTER — Other Ambulatory Visit: Payer: Self-pay

## 2020-05-18 DIAGNOSIS — E782 Mixed hyperlipidemia: Secondary | ICD-10-CM | POA: Diagnosis not present

## 2020-05-18 DIAGNOSIS — R739 Hyperglycemia, unspecified: Secondary | ICD-10-CM

## 2020-05-18 DIAGNOSIS — E039 Hypothyroidism, unspecified: Secondary | ICD-10-CM | POA: Diagnosis not present

## 2020-05-18 DIAGNOSIS — Z0000 Encounter for general adult medical examination without abnormal findings: Secondary | ICD-10-CM

## 2020-05-18 LAB — HEMOGLOBIN A1C: Hgb A1c MFr Bld: 5.4 % (ref 4.6–6.5)

## 2020-05-18 LAB — CBC WITH DIFFERENTIAL/PLATELET
Basophils Absolute: 0 10*3/uL (ref 0.0–0.1)
Basophils Relative: 0.9 % (ref 0.0–3.0)
Eosinophils Absolute: 0.1 10*3/uL (ref 0.0–0.7)
Eosinophils Relative: 1.9 % (ref 0.0–5.0)
HCT: 41.3 % (ref 36.0–46.0)
Hemoglobin: 14.1 g/dL (ref 12.0–15.0)
Lymphocytes Relative: 36.7 % (ref 12.0–46.0)
Lymphs Abs: 1.2 10*3/uL (ref 0.7–4.0)
MCHC: 34.1 g/dL (ref 30.0–36.0)
MCV: 90.5 fl (ref 78.0–100.0)
Monocytes Absolute: 0.4 10*3/uL (ref 0.1–1.0)
Monocytes Relative: 13 % — ABNORMAL HIGH (ref 3.0–12.0)
Neutro Abs: 1.6 10*3/uL (ref 1.4–7.7)
Neutrophils Relative %: 47.5 % (ref 43.0–77.0)
Platelets: 183 10*3/uL (ref 150.0–400.0)
RBC: 4.57 Mil/uL (ref 3.87–5.11)
RDW: 12.8 % (ref 11.5–15.5)
WBC: 3.3 10*3/uL — ABNORMAL LOW (ref 4.0–10.5)

## 2020-05-18 LAB — LIPID PANEL
Cholesterol: 258 mg/dL — ABNORMAL HIGH (ref 0–200)
HDL: 71 mg/dL (ref >39.00)
LDL Cholesterol: 170 mg/dL — ABNORMAL HIGH (ref 0–99)
NonHDL: 187.17
Total CHOL/HDL Ratio: 4
Triglycerides: 85 mg/dL (ref 0.0–149.0)
VLDL: 17 mg/dL (ref 0.0–40.0)

## 2020-05-18 LAB — T4, FREE: Free T4: 0.92 ng/dL (ref 0.60–1.60)

## 2020-05-18 LAB — TSH: TSH: 1.88 u[IU]/mL (ref 0.35–4.50)

## 2020-05-18 NOTE — Progress Notes (Signed)
Per orders of NP of [facility] pt is here for labs pt tolerated draw well.

## 2020-05-21 MED ORDER — ROSUVASTATIN CALCIUM 5 MG PO TABS
5.0000 mg | ORAL_TABLET | Freq: Every day | ORAL | 3 refills | Status: DC
Start: 2020-05-21 — End: 2021-05-14

## 2020-05-21 NOTE — Addendum Note (Signed)
Addended by: Michaela Corner on: 05/21/2020 02:34 PM   Modules accepted: Orders

## 2020-05-27 ENCOUNTER — Encounter: Payer: Self-pay | Admitting: Nurse Practitioner

## 2020-06-04 ENCOUNTER — Ambulatory Visit (INDEPENDENT_AMBULATORY_CARE_PROVIDER_SITE_OTHER): Payer: PRIVATE HEALTH INSURANCE | Admitting: General Practice

## 2020-06-04 ENCOUNTER — Other Ambulatory Visit: Payer: Self-pay

## 2020-06-04 DIAGNOSIS — D6859 Other primary thrombophilia: Secondary | ICD-10-CM | POA: Diagnosis not present

## 2020-06-04 LAB — POCT INR: INR: 1.9 — AB (ref 2.0–3.0)

## 2020-06-04 NOTE — Progress Notes (Signed)
Medical screening examination/treatment/procedure(s) were performed by non-physician practitioner and as supervising physician I was immediately available for consultation/collaboration. I agree with above. Vonceil Upshur, MD   

## 2020-06-04 NOTE — Patient Instructions (Addendum)
Pre visit review using our clinic review tool, if applicable. No additional management support is needed unless otherwise documented below in the visit note.  Take 1 1/2 tablets today (6/2) and then continue to take 1 tablet daily except 1/2 on Wednesdays and Saturdays.  Call about date for colonoscopy. [phone].

## 2020-06-22 ENCOUNTER — Ambulatory Visit (INDEPENDENT_AMBULATORY_CARE_PROVIDER_SITE_OTHER): Payer: PRIVATE HEALTH INSURANCE

## 2020-06-22 ENCOUNTER — Other Ambulatory Visit: Payer: Self-pay

## 2020-06-22 ENCOUNTER — Ambulatory Visit (INDEPENDENT_AMBULATORY_CARE_PROVIDER_SITE_OTHER): Payer: PRIVATE HEALTH INSURANCE | Admitting: Podiatry

## 2020-06-22 ENCOUNTER — Encounter: Payer: Self-pay | Admitting: Podiatry

## 2020-06-22 DIAGNOSIS — S92515K Nondisplaced fracture of proximal phalanx of left lesser toe(s), subsequent encounter for fracture with nonunion: Secondary | ICD-10-CM

## 2020-06-22 DIAGNOSIS — S92514P Nondisplaced fracture of proximal phalanx of right lesser toe(s), subsequent encounter for fracture with malunion: Secondary | ICD-10-CM

## 2020-06-25 NOTE — Progress Notes (Signed)
Subjective: 60 year old female presents the office today for follow-up evaluation of toe fractures.  She is not having any pain to her toes but she states that she can feel "movement".  Still unable to wear closed shoes comfortably.  She has been using a bone stimulator for 24 days.  Denies any systemic complaints such as fevers, chills, nausea, vomiting. No acute changes since last appointment, and no other complaints at this time.   Objective: AAO x3, NAD DP/PT pulses palpable bilaterally, CRT less than 3 seconds There is no tenderness palpation to the fifth digits and there is mild edema there is no erythema or warmth.  Adductovarus present of the test.  No other areas of discomfort identified bilaterally. No pain with calf compression, swelling, warmth, erythema  Assessment: Toe fractures, bilateral fifth digits  Plan: -All treatment options discussed with the patient including all alternatives, risks, complications.  -X-rays obtained reviewed. Fracture of the fifth toe on the left side noted.  Fracture noted fifth digit with some increased consolidation with mild displacement. -Continue bone stimulator.  Discussed with her she can wear a regular shoe as tolerated as the discomfort improves. -Patient encouraged to call the office with any questions, concerns, change in symptoms.   Vivi Barrack DPM

## 2020-07-13 ENCOUNTER — Telehealth: Payer: Self-pay

## 2020-07-13 ENCOUNTER — Telehealth: Payer: Self-pay | Admitting: General Practice

## 2020-07-13 ENCOUNTER — Other Ambulatory Visit: Payer: Self-pay | Admitting: General Practice

## 2020-07-13 DIAGNOSIS — Z7901 Long term (current) use of anticoagulants: Secondary | ICD-10-CM

## 2020-07-13 MED ORDER — ENOXAPARIN SODIUM 120 MG/0.8ML IJ SOSY: 120.0000 mg | PREFILLED_SYRINGE | INTRAMUSCULAR | 0 refills | Status: DC

## 2020-07-13 NOTE — Telephone Encounter (Signed)
Instructions for coumadin and Lovenox pre and post procedure on 7/13.   7/10 - Last dose of coumadin until after procedure 7/11 - Nothing today 7/12 - Lovenox X 1 in the AM (Take by 7 am) 7/13- Procedure (DO NOT TAKE LOVENOX TODAY) 7/14 - Lovenox in the AM and 1 1/2 tablets of coumadin 7/15 - Lovenox in the AM  and 1 1/2 tablets of coumadin 7/16 - Lovenox in the AM and 1 1/2 tablets of coumadin 7/17 - Lovenox in the AM and 1 1/2 tablets of coumadin 7/18 - Stop Lovenox and take 1 tablet of coumadin 7/19 - Re-check INR  Instructions given to patient and patient verbalized understanding.

## 2020-07-13 NOTE — Telephone Encounter (Signed)
Pt states she has a colonoscopy scheduled for this Wednesday and will need a Lovenox Rx prior to this. Pt needs Rx today because it took 6 months for her to get this appointment and she does not want to reschedule.

## 2020-07-14 ENCOUNTER — Ambulatory Visit (INDEPENDENT_AMBULATORY_CARE_PROVIDER_SITE_OTHER): Payer: PRIVATE HEALTH INSURANCE | Admitting: General Practice

## 2020-07-14 ENCOUNTER — Other Ambulatory Visit: Payer: Self-pay

## 2020-07-14 DIAGNOSIS — Z7901 Long term (current) use of anticoagulants: Secondary | ICD-10-CM | POA: Diagnosis not present

## 2020-07-14 LAB — POCT INR: INR: 3.4 — AB (ref 2.0–3.0)

## 2020-07-14 NOTE — Progress Notes (Signed)
Medical screening examination/treatment/procedure(s) were performed by non-physician practitioner and as supervising physician I was immediately available for consultation/collaboration. I agree with above. Marygrace Sandoval, MD   

## 2020-07-14 NOTE — Patient Instructions (Signed)
Pre visit review using our clinic review tool, if applicable. No additional management support is needed unless otherwise documented below in the visit note.  Skip dose for today (7/12) and then continue to take 1 tablet daily except 1/2 on Wednesdays and Saturdays.

## 2020-07-15 NOTE — Telephone Encounter (Signed)
Revised instructions for coumadin and Lovenox for procedure on 8/17.  8/12 - Last dose of coumadin until after procedure 8/13 - Nothing (No coumadin and No Lovenox) 8/14 - Lovenox X 1 in the AM 8/15 - Lovenox X 1 in the AM 8/16 - Lovenox X 1 in the AM (take by 7 am) 8/17 - Procedure day. DO NOT TAKE LOVENOX TODAY!! 8/18 - Lovenox X 1 and 1 1/2 tablets of coumadin 8/19 - Lovenox X 1 and 1 1/2 tablets of coumadin 8/20 - Lovenox X 1 and 1 1/2 tablets of coumadin 8/21 - Lovenox X 1 and 1 1/2 tablets of coumadin 8/22 Stop Lovenox and take 1 tablet of coumadin 8/23 - Check INR

## 2020-07-21 ENCOUNTER — Ambulatory Visit: Payer: PRIVATE HEALTH INSURANCE

## 2020-08-03 ENCOUNTER — Other Ambulatory Visit: Payer: Self-pay | Admitting: General Practice

## 2020-08-03 DIAGNOSIS — Z7901 Long term (current) use of anticoagulants: Secondary | ICD-10-CM

## 2020-08-03 MED ORDER — ENOXAPARIN SODIUM 120 MG/0.8ML IJ SOSY: 120.0000 mg | PREFILLED_SYRINGE | INTRAMUSCULAR | 0 refills | Status: DC

## 2020-08-04 ENCOUNTER — Other Ambulatory Visit: Payer: Self-pay | Admitting: General Practice

## 2020-08-04 DIAGNOSIS — Z7901 Long term (current) use of anticoagulants: Secondary | ICD-10-CM

## 2020-08-04 MED ORDER — ENOXAPARIN SODIUM 120 MG/0.8ML IJ SOSY: 120.0000 mg | PREFILLED_SYRINGE | INTRAMUSCULAR | 0 refills | Status: DC

## 2020-08-11 ENCOUNTER — Other Ambulatory Visit: Payer: Self-pay | Admitting: General Practice

## 2020-08-11 DIAGNOSIS — Z7901 Long term (current) use of anticoagulants: Secondary | ICD-10-CM

## 2020-08-11 MED ORDER — ENOXAPARIN SODIUM 120 MG/0.8ML IJ SOSY: 120.0000 mg | PREFILLED_SYRINGE | INTRAMUSCULAR | 0 refills | Status: DC

## 2020-08-24 ENCOUNTER — Other Ambulatory Visit: Payer: Self-pay

## 2020-08-24 ENCOUNTER — Ambulatory Visit (INDEPENDENT_AMBULATORY_CARE_PROVIDER_SITE_OTHER): Payer: PRIVATE HEALTH INSURANCE

## 2020-08-24 ENCOUNTER — Telehealth: Payer: Self-pay | Admitting: Nurse Practitioner

## 2020-08-24 ENCOUNTER — Encounter: Payer: Self-pay | Admitting: Podiatry

## 2020-08-24 ENCOUNTER — Ambulatory Visit (INDEPENDENT_AMBULATORY_CARE_PROVIDER_SITE_OTHER): Payer: PRIVATE HEALTH INSURANCE | Admitting: Podiatry

## 2020-08-24 DIAGNOSIS — S92514P Nondisplaced fracture of proximal phalanx of right lesser toe(s), subsequent encounter for fracture with malunion: Secondary | ICD-10-CM

## 2020-08-24 DIAGNOSIS — S92515K Nondisplaced fracture of proximal phalanx of left lesser toe(s), subsequent encounter for fracture with nonunion: Secondary | ICD-10-CM

## 2020-08-24 NOTE — Telephone Encounter (Signed)
Patient called to cancel appt on 08/24/2020. Requesting a callback to r/s.

## 2020-08-24 NOTE — Telephone Encounter (Signed)
Pt has been RS for [facility] for tomorrow per pt request

## 2020-08-24 NOTE — Progress Notes (Signed)
Dr. Lurene Shadow- thyroid Melinda Benton   Send message about calcium and vitamin d level --  Subjective: 60 year old female presents the office today for follow-up evaluation of toe fractures.  States that she is been doing better.  She did go for a walk recently and did not have any significant pain.  She states that she still feels some slight "movement".  She is using a bone stimulator.  No recent injury or trauma.  No increase in swelling.  No recent changes otherwise.  No other concerns.   Objective: AAO x3, NAD DP/PT pulses palpable bilaterally, CRT less than 3 seconds There is no tenderness palpation to the fifth digits and there is minimal edema there is no erythema or warmth.  Adductovarus present of the test.  No other areas of discomfort identified bilaterally. No pain with calf compression, swelling, warmth, erythema  Assessment: Toe fractures, bilateral fifth digits  Plan: -All treatment options discussed with the patient including all alternatives, risks, complications.  -X-rays obtained reviewed. Fracture of the fifth toe on the left side noted.  Increased consolidation noted across the fracture site. -Continue with supportive shoe gear to help offloading.  Continue bone stimulator.  She can increase activity level as tolerated. -Discussed checking vitamin D, calcium levels.  She does not follow-up with other doctors and notes will be sent to Dr. Lurene Shadow and PCP.   Vivi Barrack DPM

## 2020-08-25 ENCOUNTER — Ambulatory Visit (INDEPENDENT_AMBULATORY_CARE_PROVIDER_SITE_OTHER): Payer: PRIVATE HEALTH INSURANCE

## 2020-08-25 ENCOUNTER — Ambulatory Visit: Payer: PRIVATE HEALTH INSURANCE

## 2020-08-25 DIAGNOSIS — Z7901 Long term (current) use of anticoagulants: Secondary | ICD-10-CM | POA: Diagnosis not present

## 2020-08-25 LAB — POCT INR: INR: 1.2 — AB (ref 2.0–3.0)

## 2020-08-25 NOTE — Progress Notes (Signed)
Medical screening examination/treatment/procedure(s) were performed by non-physician practitioner and as supervising physician I was immediately available for consultation/collaboration. I agree with above. Roy Snuffer, MD   

## 2020-08-25 NOTE — Patient Instructions (Addendum)
Pre visit review using our clinic review tool, if applicable. No additional management support is needed unless otherwise documented below in the visit note.  Increase dose today to 12mg  and increase dose tomorrow to 6mg  and then continue 6mg  daily except take 3mg  on Wed and Sat. Recheck on 8/30.

## 2020-09-01 ENCOUNTER — Other Ambulatory Visit: Payer: Self-pay

## 2020-09-01 ENCOUNTER — Ambulatory Visit (INDEPENDENT_AMBULATORY_CARE_PROVIDER_SITE_OTHER): Payer: PRIVATE HEALTH INSURANCE

## 2020-09-01 DIAGNOSIS — Z7901 Long term (current) use of anticoagulants: Secondary | ICD-10-CM | POA: Diagnosis not present

## 2020-09-01 LAB — POCT INR: INR: 2.3 (ref 2.0–3.0)

## 2020-09-01 NOTE — Progress Notes (Signed)
Medical screening examination/treatment/procedure(s) were performed by non-physician practitioner and as supervising physician I was immediately available for consultation/collaboration. I agree with above. Ewart Carrera, MD   

## 2020-09-01 NOTE — Patient Instructions (Signed)
Pre visit review using our clinic review tool, if applicable. No additional management support is needed unless otherwise documented below in the visit note.  Continue 6mg  daily except take 3mg  on Wed and Sat. Recheck in 8 wks.

## 2020-09-14 ENCOUNTER — Other Ambulatory Visit: Payer: Self-pay | Admitting: Endocrinology

## 2020-09-14 DIAGNOSIS — E041 Nontoxic single thyroid nodule: Secondary | ICD-10-CM

## 2020-09-28 ENCOUNTER — Other Ambulatory Visit: Payer: PRIVATE HEALTH INSURANCE

## 2020-10-27 ENCOUNTER — Ambulatory Visit: Payer: PRIVATE HEALTH INSURANCE | Admitting: Podiatry

## 2020-10-29 ENCOUNTER — Ambulatory Visit: Payer: PRIVATE HEALTH INSURANCE

## 2020-11-03 ENCOUNTER — Ambulatory Visit (INDEPENDENT_AMBULATORY_CARE_PROVIDER_SITE_OTHER): Payer: PRIVATE HEALTH INSURANCE

## 2020-11-03 ENCOUNTER — Other Ambulatory Visit: Payer: Self-pay

## 2020-11-03 DIAGNOSIS — Z7901 Long term (current) use of anticoagulants: Secondary | ICD-10-CM

## 2020-11-03 LAB — POCT INR: INR: 1.7 — AB (ref 2.0–3.0)

## 2020-11-03 NOTE — Patient Instructions (Addendum)
Pre visit review using our clinic review tool, if applicable. No additional management support is needed unless otherwise documented below in the visit note.  Increase dose today to 9mg  and then continue 6mg  daily except take 3mg  on Wed and Sat. Recheck in 6 wks.

## 2020-11-03 NOTE — Progress Notes (Signed)
Patient ID: Melinda Benton, female   DOB: 09/09/1960, 60 y.o.   MRN: [id number] ° °Medical screening examination/treatment/procedure(s) were performed by non-physician practitioner and as supervising physician I was immediately available for consultation/collaboration.  I agree with above. Aveon Colquhoun, MD ° °

## 2020-11-03 NOTE — Progress Notes (Signed)
Increase dose today to 9mg  and then continue 6mg  daily except take 3mg  on Wed and Sat. Recheck in 6 wks.

## 2020-11-24 DIAGNOSIS — D6859 Other primary thrombophilia: Secondary | ICD-10-CM | POA: Insufficient documentation

## 2020-11-24 DIAGNOSIS — D069 Carcinoma in situ of cervix, unspecified: Secondary | ICD-10-CM | POA: Insufficient documentation

## 2020-12-08 ENCOUNTER — Ambulatory Visit
Admission: RE | Admit: 2020-12-08 | Discharge: 2020-12-08 | Disposition: A | Discharge status: Home / Self Care | Payer: PRIVATE HEALTH INSURANCE | Source: Ambulatory Visit | Attending: Endocrinology | Admitting: Endocrinology

## 2020-12-08 DIAGNOSIS — E041 Nontoxic single thyroid nodule: Secondary | ICD-10-CM

## 2020-12-11 ENCOUNTER — Other Ambulatory Visit: Payer: Self-pay | Admitting: Nurse Practitioner

## 2020-12-11 DIAGNOSIS — Z7901 Long term (current) use of anticoagulants: Secondary | ICD-10-CM

## 2020-12-17 ENCOUNTER — Telehealth: Payer: Self-pay

## 2020-12-17 ENCOUNTER — Ambulatory Visit: Payer: PRIVATE HEALTH INSURANCE

## 2020-12-17 NOTE — Telephone Encounter (Signed)
Pt contacted coumadin clinic to report her son is having surgery and she RS for this afternoon. But, pt called and reported when she got her son home he was not feeling well and having some vomiting. She requested to RS to next week. Advised next week there will not be a coumadin nurse in the office. RS pt for 12/27 and advised to contact office if any changes. Pt verbalized understanding.

## 2020-12-29 ENCOUNTER — Ambulatory Visit (INDEPENDENT_AMBULATORY_CARE_PROVIDER_SITE_OTHER): Payer: PRIVATE HEALTH INSURANCE

## 2020-12-29 ENCOUNTER — Other Ambulatory Visit: Payer: Self-pay

## 2020-12-29 DIAGNOSIS — Z7901 Long term (current) use of anticoagulants: Secondary | ICD-10-CM | POA: Diagnosis not present

## 2020-12-29 LAB — POCT INR: INR: 2.7 (ref 2.0–3.0)

## 2020-12-29 NOTE — Progress Notes (Signed)
Continue 6mg  daily except take 3mg  on Wed and Sat. Recheck in 6 wks.

## 2020-12-29 NOTE — Progress Notes (Signed)
Patient ID: Melinda Benton, female   DOB: [birth year], 60 y.o.   MRN: [id number]  Medical screening examination/treatment/procedure(s) were performed by non-physician practitioner and as supervising physician I was immediately available for consultation/collaboration.  I agree with above. Oliver Barre, MD

## 2020-12-29 NOTE — Patient Instructions (Addendum)
Pre visit review using our clinic review tool, if applicable. No additional management support is needed unless otherwise documented below in the visit note.  Continue 6mg  daily except take 3mg  on Wed and Sat. Recheck in 6 wks.

## 2021-01-25 ENCOUNTER — Telehealth: Payer: Self-pay

## 2021-01-25 DIAGNOSIS — Z7901 Long term (current) use of anticoagulants: Secondary | ICD-10-CM

## 2021-01-25 MED ORDER — WARFARIN SODIUM 6 MG PO TABS: ORAL_TABLET | ORAL | 1 refills | Status: DC

## 2021-01-25 NOTE — Telephone Encounter (Signed)
Pt called and reported she is out of warfarin and requested a refill sent to [facility].  Pt is complaint with warfarin management and PCP apts. Sent in refill

## 2021-02-10 ENCOUNTER — Ambulatory Visit (INDEPENDENT_AMBULATORY_CARE_PROVIDER_SITE_OTHER): Payer: PRIVATE HEALTH INSURANCE | Admitting: Nurse Practitioner

## 2021-02-10 ENCOUNTER — Other Ambulatory Visit: Payer: Self-pay

## 2021-02-10 ENCOUNTER — Encounter: Payer: Self-pay | Admitting: Nurse Practitioner

## 2021-02-10 VITALS — BP 132/70 | HR 67 | Temp 97.0°F | Wt 169.6 lb

## 2021-02-10 DIAGNOSIS — R300 Dysuria: Secondary | ICD-10-CM

## 2021-02-10 DIAGNOSIS — N3000 Acute cystitis without hematuria: Secondary | ICD-10-CM

## 2021-02-10 LAB — POCT URINALYSIS DIPSTICK
Bilirubin, UA: NEGATIVE
Blood, UA: NEGATIVE
Glucose, UA: NEGATIVE
Ketones, UA: NEGATIVE
Nitrite, UA: NEGATIVE
Protein, UA: NEGATIVE
Spec Grav, UA: 1.015 (ref 1.010–1.025)
Urobilinogen, UA: 0.2 E.U./dL
pH, UA: 6.5 (ref 5.0–8.0)

## 2021-02-10 MED ORDER — CIPROFLOXACIN HCL 250 MG PO TABS: 250.0000 mg | ORAL_TABLET | Freq: Two times a day (BID) | ORAL | 0 refills | Status: AC

## 2021-02-10 MED ORDER — PHENAZOPYRIDINE HCL 100 MG PO TABS: 100.0000 mg | ORAL_TABLET | Freq: Three times a day (TID) | ORAL | 0 refills | Status: DC | PRN

## 2021-02-10 NOTE — Progress Notes (Signed)
Acute Office Visit  Subjective:    Patient ID: Melinda Benton, female    DOB: 1960-03-08, 61 y.o.   MRN: [id number]  Chief Complaint  Patient presents with   Urinary Tract Infection    Pt c/o UTI symptoms w/ burning while urinating and left flank pain x1 week    HPI Patient is in today for burning with urination and left flank pain for 1 week.  URINARY SYMPTOMS  Dysuria: burning Urinary frequency: yes Urgency: yes Small volume voids: yes Symptom severity:  moderate Urinary incontinence: no Foul odor: no Hematuria: no Abdominal pain: no Back pain: yes Suprapubic pain/pressure: no Flank pain: yes  Fever:  no Vomiting: no Relief with cranberry juice: n/a Relief with pyridium:  n/a Status: worse Previous urinary tract infection: yes Recurrent urinary tract infection: no Treatments attempted: increasing fluids    Past Medical History:  Diagnosis Date   Arthritis    Clotting disorder ([place])    Cough 05/19/2014   H/O blood clots    History of chicken pox    Migraines    Mitral valve prolapse    Thyroid disease     Past Surgical History:  Procedure Laterality Date   bladder sur     CESAREAN SECTION     2   CHOLECYSTECTOMY N/A 07/07/2019   Procedure: LAPAROSCOPIC CHOLECYSTECTOMY WITH INTRAOPERATIVE CHOLANGIOGRAM;  Surgeon: Jovita Kussmaul, MD;  Location: [facility];  Service: General;  Laterality: N/A;   ELBOW SURGERY  2009   knee repla Left 07/2016   partial knee replacement    ROTATOR CUFF REPAIR  2011   TONSILLECTOMY  ?1968    Family History  Problem Relation Age of Onset   Protein S deficiency Mother    Clotting disorder Mother    Heart disease Father    Diabetes Father    Headache Father    Colon cancer Paternal Grandfather    Stroke Brother    Diabetes Paternal Aunt    Diabetes Paternal Uncle    Colon cancer Paternal Grandmother 17   Colon cancer Cousin 58       paternal    Social History   Socioeconomic History   Marital status: Married     Spouse name: Not on file   Number of children: 3   Years of education: 16   Highest education level: Not on file  Occupational History   Occupation: Chief Executive Officer  Tobacco Use   Smoking status: Never   Smokeless tobacco: Never  Vaping Use   Vaping Use: Never used  Substance and Sexual Activity   Alcohol use: Yes    Comment: Socially   Drug use: No   Sexual activity: Yes  Other Topics Concern   Not on file  Social History Narrative   Regular exercise-yes   Caffeine Use-yes   Social Determinants of Health   Financial Resource Strain: Not on file  Food Insecurity: Not on file  Transportation Needs: Not on file  Physical Activity: Not on file  Stress: Not on file  Social Connections: Not on file  Intimate Partner Violence: Not on file    Outpatient Medications Prior to Visit  Medication Sig Dispense Refill   AMBULATORY NON FORMULARY MEDICATION Bone stimulator. Use as directed for fracture 1 each 0   buPROPion (WELLBUTRIN XL) 300 MG 24 hr tablet Take 1 tablet (300 mg total) by mouth daily. Need Office Visit for additional refills 90 tablet 0   enoxaparin (LOVENOX) 120 MG/0.8ML injection Inject 0.8 mLs (  120 mg total) into the skin daily for 7 doses. 5.6 mL 0   levothyroxine (SYNTHROID, LEVOTHROID) 88 MCG tablet Take 88 mcg by mouth daily before breakfast.     Naltrexone-buPROPion HCl ER 8-90 MG TB12 Take 1 tablet by mouth in the morning and at bedtime.     NON FORMULARY Antifungal (nail)-#1     pantoprazole (PROTONIX) 20 MG tablet Take 20 mg by mouth daily as needed for heartburn or indigestion.     rosuvastatin (CRESTOR) 5 MG tablet Take 1 tablet (5 mg total) by mouth at bedtime. 90 tablet 3   warfarin (COUMADIN) 6 MG tablet TAKE 1 TABLETS (6 MG) DAILY BY MOUTH EXCEPT TAKE 1/2 TABLET (3 MG) ON WEDNESDAYS AND SATURDAYS OR AS DIRECTED BY ANTICOAGULATION CLINIC 60 tablet 1   No facility-administered medications prior to visit.    No Known Allergies  Review of  Systems See pertinent positives and negatives per HPI.    Objective:    Physical Exam Vitals and nursing note reviewed.  Constitutional:      General: She is not in acute distress.    Appearance: Normal appearance.  HENT:     Head: Normocephalic.  Eyes:     Conjunctiva/sclera: Conjunctivae normal.  Cardiovascular:     Rate and Rhythm: Normal rate and regular rhythm.     Pulses: Normal pulses.     Heart sounds: Normal heart sounds.  Pulmonary:     Effort: Pulmonary effort is normal.     Breath sounds: Normal breath sounds.  Abdominal:     Tenderness: There is abdominal tenderness (LLQ). There is left CVA tenderness. There is no right CVA tenderness.  Musculoskeletal:     Cervical back: Normal range of motion.  Skin:    General: Skin is warm.  Neurological:     General: No focal deficit present.     Mental Status: She is alert and oriented to person, place, and time.  Psychiatric:        Mood and Affect: Mood normal.        Behavior: Behavior normal.        Thought Content: Thought content normal.        Judgment: Judgment normal.    BP 132/70    Pulse 67    Temp (!) 97 F (36.1 C) (Temporal)    Wt 169 lb 9.6 oz (76.9 kg)    LMP 05/01/2012    SpO2 98%    BMI 25.98 kg/m  Wt Readings from Last 3 Encounters:  02/10/21 169 lb 9.6 oz (76.9 kg)  05/13/20 175 lb (79.4 kg)  04/10/20 173 lb (78.5 kg)    Health Maintenance Due  Topic Date Due   Zoster Vaccines- Shingrix (1 of 2) Never done   TETANUS/TDAP  01/04/2019   COVID-19 Vaccine (4 - Booster for Moderna series) 02/18/2020   INFLUENZA VACCINE  08/03/2020   MAMMOGRAM  11/06/2020    There are no preventive care reminders to display for this patient.   Lab Results  Component Value Date   TSH 1.88 05/18/2020   Lab Results  Component Value Date   WBC 3.3 (L) 05/18/2020   HGB 14.1 05/18/2020   HCT 41.3 05/18/2020   MCV 90.5 05/18/2020   PLT 183.0 05/18/2020   Lab Results  Component Value Date   NA 139  01/28/2020   K 4.0 01/28/2020   CO2 29 01/28/2020   GLUCOSE 77 01/28/2020   BUN 12 01/28/2020   CREATININE 0.89 01/28/2020  BILITOT 0.6 01/28/2020   ALKPHOS 48 01/28/2020   AST 16 01/28/2020   ALT 12 01/28/2020   PROT 7.1 01/28/2020   ALBUMIN 4.6 01/28/2020   CALCIUM 9.6 01/28/2020   ANIONGAP 10 07/12/2019   GFR 70.84 01/28/2020   Lab Results  Component Value Date   CHOL 258 (H) 05/18/2020   Lab Results  Component Value Date   HDL 71.00 05/18/2020   Lab Results  Component Value Date   LDLCALC 170 (H) 05/18/2020   Lab Results  Component Value Date   TRIG 85.0 05/18/2020   Lab Results  Component Value Date   CHOLHDL 4 05/18/2020   Lab Results  Component Value Date   HGBA1C 5.4 05/18/2020       Assessment & Plan:   Problem List Items Addressed This Visit   None Visit Diagnoses     Dysuria    -  Primary   U/A showed trace leukocytes. CVA tenderness on exam. Will treat for UTI   Relevant Orders   POCT urinalysis dipstick (Completed)   Urine Culture   Acute cystitis without hematuria       Will treat with cipro BID x7 days. Pyridium prn urinary discomfort. Monitor for signs of bleeding with coumadin. F/U if not improving        Meds ordered this encounter  Medications   ciprofloxacin (CIPRO) 250 MG tablet    Sig: Take 1 tablet (250 mg total) by mouth 2 (two) times daily for 7 days.    Dispense:  14 tablet    Refill:  0   phenazopyridine (PYRIDIUM) 100 MG tablet    Sig: Take 1 tablet (100 mg total) by mouth 3 (three) times daily as needed for pain.    Dispense:  10 tablet    Refill:  0     Charyl Dancer, NP

## 2021-02-10 NOTE — Patient Instructions (Signed)
It was great to see you!  Start cipro 1 pill twice a day for 7 days. You can take pyridium as needed for urinary discomfort. Monitor for any signs of bleeding and when you get your next level checked, let them know you were taking an antibiotic.   Follow up if your symptoms worsen or don't improve.   Take care,  Rodman Pickle, NP

## 2021-02-11 LAB — URINE CULTURE
MICRO NUMBER:: 12981273
SPECIMEN QUALITY:: ADEQUATE

## 2021-02-22 ENCOUNTER — Telehealth: Payer: Self-pay

## 2021-02-22 NOTE — Telephone Encounter (Signed)
Pt LVM reporting her husband has covid so she would like to cancel apt for tomorrow. She will f/u at the end of the week to RS.  Apt cancelled

## 2021-02-23 ENCOUNTER — Ambulatory Visit: Payer: PRIVATE HEALTH INSURANCE

## 2021-03-05 ENCOUNTER — Other Ambulatory Visit: Payer: Self-pay

## 2021-03-05 ENCOUNTER — Ambulatory Visit (INDEPENDENT_AMBULATORY_CARE_PROVIDER_SITE_OTHER): Payer: PRIVATE HEALTH INSURANCE

## 2021-03-05 DIAGNOSIS — Z7901 Long term (current) use of anticoagulants: Secondary | ICD-10-CM

## 2021-03-05 LAB — POCT INR: INR: 2.1 (ref 2.0–3.0)

## 2021-03-05 NOTE — Patient Instructions (Addendum)
Pre visit review using our clinic review tool, if applicable. No additional management support is needed unless otherwise documented below in the visit note. ? ?Continue 6mg  daily except take 3mg  on Wed and Sat. Recheck in 7 wks per pt request. ?

## 2021-03-05 NOTE — Progress Notes (Signed)
Continue 6mg  daily except take 3mg  on Wed and Sat. Recheck in 7 wks per pt request. ?

## 2021-03-05 NOTE — Progress Notes (Signed)
Patient ID: Melinda Benton, female   DOB: 08/22/1960, 60 y.o.   MRN: [id number] ° °Medical screening examination/treatment/procedure(s) were performed by non-physician practitioner and as supervising physician I was immediately available for consultation/collaboration.  I agree with above. Valory Wetherby, MD ° °

## 2021-04-16 ENCOUNTER — Ambulatory Visit (INDEPENDENT_AMBULATORY_CARE_PROVIDER_SITE_OTHER): Payer: PRIVATE HEALTH INSURANCE

## 2021-04-16 DIAGNOSIS — Z7901 Long term (current) use of anticoagulants: Secondary | ICD-10-CM | POA: Diagnosis not present

## 2021-04-16 LAB — POCT INR: INR: 1.6 — AB (ref 2.0–3.0)

## 2021-04-16 MED ORDER — WARFARIN SODIUM 6 MG PO TABS: ORAL_TABLET | ORAL | 1 refills | Status: DC

## 2021-04-16 NOTE — Patient Instructions (Addendum)
Pre visit review using our clinic review tool, if applicable. No additional management support is needed unless otherwise documented below in the visit note. ? ?Increase dose today to take 9 mg and increase dose tomorrow to take 6 mg and then continue 6mg  daily except take 3mg  on Wed and Sat. Recheck in 6 wks.  ?

## 2021-04-16 NOTE — Progress Notes (Addendum)
Increase dose today to take 9 mg and increase dose tomorrow to take 6 mg and then continue 6mg  daily except take 3mg  on Wed and Sat. Recheck in 6 wks per pt request. ? ?Pt requested refill of warfarin because last refill was for a 60 days supply and she prefers a 90 day supply. Sent in refill. ?

## 2021-04-20 ENCOUNTER — Ambulatory Visit: Payer: PRIVATE HEALTH INSURANCE

## 2021-05-06 ENCOUNTER — Telehealth: Payer: Self-pay | Admitting: Nurse Practitioner

## 2021-05-06 NOTE — Telephone Encounter (Signed)
Pt has brought a form Pre-op Risk Assessment to be filled out by [facility]. I have placed form in her folder up front. Please advise pt when complete at [phone] and she is wanting this form faxed to [phone]. ?

## 2021-05-06 NOTE — Telephone Encounter (Signed)
Form placed on providers desk. Will call patient when complete. ?

## 2021-05-06 NOTE — Telephone Encounter (Signed)
Pt is having knee replacement at the end of this month and needs a form filled out by PCP. Is this something she needs an OV for?  ?

## 2021-05-07 ENCOUNTER — Ambulatory Visit: Payer: PRIVATE HEALTH INSURANCE | Admitting: Nurse Practitioner

## 2021-05-07 ENCOUNTER — Encounter: Payer: Self-pay | Admitting: Nurse Practitioner

## 2021-05-07 VITALS — BP 110/82 | HR 80 | Temp 97.9°F | Ht 68.0 in | Wt 166.2 lb

## 2021-05-07 DIAGNOSIS — R739 Hyperglycemia, unspecified: Secondary | ICD-10-CM

## 2021-05-07 DIAGNOSIS — Z23 Encounter for immunization: Secondary | ICD-10-CM | POA: Diagnosis not present

## 2021-05-07 DIAGNOSIS — E782 Mixed hyperlipidemia: Secondary | ICD-10-CM

## 2021-05-07 DIAGNOSIS — Z01818 Encounter for other preprocedural examination: Secondary | ICD-10-CM | POA: Diagnosis not present

## 2021-05-07 DIAGNOSIS — E039 Hypothyroidism, unspecified: Secondary | ICD-10-CM

## 2021-05-07 DIAGNOSIS — K219 Gastro-esophageal reflux disease without esophagitis: Secondary | ICD-10-CM

## 2021-05-07 DIAGNOSIS — Z0001 Encounter for general adult medical examination with abnormal findings: Secondary | ICD-10-CM

## 2021-05-07 DIAGNOSIS — Z0000 Encounter for general adult medical examination without abnormal findings: Secondary | ICD-10-CM

## 2021-05-07 DIAGNOSIS — M810 Age-related osteoporosis without current pathological fracture: Secondary | ICD-10-CM | POA: Insufficient documentation

## 2021-05-07 DIAGNOSIS — D6859 Other primary thrombophilia: Secondary | ICD-10-CM

## 2021-05-07 DIAGNOSIS — Z7901 Long term (current) use of anticoagulants: Secondary | ICD-10-CM

## 2021-05-07 MED ORDER — PANTOPRAZOLE SODIUM 20 MG PO TBEC: 20.0000 mg | DELAYED_RELEASE_TABLET | Freq: Every day | ORAL | 0 refills | Status: AC | PRN

## 2021-05-07 MED ORDER — IBANDRONATE SODIUM 150 MG PO TABS: 150.0000 mg | ORAL_TABLET | ORAL | 3 refills | Status: AC

## 2021-05-07 NOTE — Assessment & Plan Note (Signed)
Reports thyroid nodules per recent thyroid US ?Managed by Dr. Chalmers Cater ?Has upcoming at 06/2021 ?Include TSH and T4 to labs today. Fax results to Dr. Chalmers Cater. ?Obtain thyroid US ?

## 2021-05-07 NOTE — Patient Instructions (Addendum)
Need to Bridge coumadin with lovenox for upcoming knee surgery. Will send message to coumadin nurse. ?Schedule lab appt. ?Start boniva after knee surgery. ?Sign medical release to get records from GYN and endocrinology. ? ?Wish you rapid recovery ? ?Preventive Care 9-61 Years Old, Female ?Preventive care refers to lifestyle choices and visits with your health care provider that can promote health and wellness. Preventive care visits are also called wellness exams. ?What can I expect for my preventive care visit? ?Counseling ?Your health care provider may ask you questions about your: ?Medical history, including: ?Past medical problems. ?Family medical history. ?Pregnancy history. ?Current health, including: ?Menstrual cycle. ?Method of birth control. ?Emotional well-being. ?Home life and relationship well-being. ?Sexual activity and sexual health. ?Lifestyle, including: ?Alcohol, nicotine or tobacco, and drug use. ?Access to firearms. ?Diet, exercise, and sleep habits. ?Work and work Statistician. ?Sunscreen use. ?Safety issues such as seatbelt and bike helmet use. ?Physical exam ?Your health care provider will check your: ?Height and weight. These may be used to calculate your BMI (body mass index). BMI is a measurement that tells if you are at a healthy weight. ?Waist circumference. This measures the distance around your waistline. This measurement also tells if you are at a healthy weight and may help predict your risk of certain diseases, such as type 2 diabetes and high blood pressure. ?Heart rate and blood pressure. ?Body temperature. ?Skin for abnormal spots. ?What immunizations do I need? ? ?Vaccines are usually given at various ages, according to a schedule. Your health care provider will recommend vaccines for you based on your age, medical history, and lifestyle or other factors, such as travel or where you work. ?What tests do I need? ?Screening ?Your health care provider may recommend screening tests  for certain conditions. This may include: ?Lipid and cholesterol levels. ?Diabetes screening. This is done by checking your blood sugar (glucose) after you have not eaten for a while (fasting). ?Pelvic exam and Pap test. ?Hepatitis B test. ?Hepatitis C test. ?HIV (human immunodeficiency virus) test. ?STI (sexually transmitted infection) testing, if you are at risk. ?Lung cancer screening. ?Colorectal cancer screening. ?Mammogram. Talk with your health care provider about when you should start having regular mammograms. This may depend on whether you have a family history of breast cancer. ?BRCA-related cancer screening. This may be done if you have a family history of breast, ovarian, tubal, or peritoneal cancers. ?Bone density scan. This is done to screen for osteoporosis. ?Talk with your health care provider about your test results, treatment options, and if necessary, the need for more tests. ?Follow these instructions at home: ?Eating and drinking ? ?Eat a diet that includes fresh fruits and vegetables, whole grains, lean protein, and low-fat dairy products. ?Take vitamin and mineral supplements as recommended by your health care provider. ?Do not drink alcohol if: ?Your health care provider tells you not to drink. ?You are pregnant, may be pregnant, or are planning to become pregnant. ?If you drink alcohol: ?Limit how much you have to 0-1 drink a day. ?Know how much alcohol is in your drink. In the [place] one drink equals one 12 oz bottle of beer (355 mL), one 5 oz glass of wine (148 mL), or one 1? oz glass of hard liquor (44 mL). ?Lifestyle ?Brush your teeth every morning and night with fluoride toothpaste. Floss one time each day. ?Exercise for at least 30 minutes 5 or more days each week. ?Do not use any products that contain nicotine or tobacco. These products include  cigarettes, chewing tobacco, and vaping devices, such as e-cigarettes. If you need help quitting, ask your health care provider. ?Do not use  drugs. ?If you are sexually active, practice safe sex. Use a condom or other form of protection to prevent STIs. ?If you do not wish to become pregnant, use a form of birth control. If you plan to become pregnant, see your health care provider for a prepregnancy visit. ?Take aspirin only as told by your health care provider. Make sure that you understand how much to take and what form to take. Work with your health care provider to find out whether it is safe and beneficial for you to take aspirin daily. ?Find healthy ways to manage stress, such as: ?Meditation, yoga, or listening to music. ?Journaling. ?Talking to a trusted person. ?Spending time with friends and family. ?Minimize exposure to UV radiation to reduce your risk of skin cancer. ?Safety ?Always wear your seat belt while driving or riding in a vehicle. ?Do not drive: ?If you have been drinking alcohol. Do not ride with someone who has been drinking. ?When you are tired or distracted. ?While texting. ?If you have been using any mind-altering substances or drugs. ?Wear a helmet and other protective equipment during sports activities. ?If you have firearms in your house, make sure you follow all gun safety procedures. ?Seek help if you have been physically or sexually abused. ?What's next? ?Visit your health care provider once a year for an annual wellness visit. ?Ask your health care provider how often you should have your eyes and teeth checked. ?Stay up to date on all vaccines. ?This information is not intended to replace advice given to you by your health care provider. Make sure you discuss any questions you have with your health care provider. ?Document Revised: 06/17/2020 Document Reviewed: 06/17/2020 ?Elsevier Patient Education ? [place]. ? ? ?

## 2021-05-07 NOTE — Assessment & Plan Note (Addendum)
Upcoming R. Knee surgery 06/02/2021. ?Needs lovenox bridge. ?Plan to switch from coumadin clinic monitor to home monitoring machine if approved by insurance. ?

## 2021-05-07 NOTE — Assessment & Plan Note (Addendum)
dexa scan completed by Dr. Arelia Sneddon: report requested ?We discussed use of boniva once in combination with calcium 600mg  and vit. D 1000IU daily. ?With upcoming knee surgery, advised to start boniva after knee surgery. ?

## 2021-05-07 NOTE — Assessment & Plan Note (Signed)
She stopped statin due to improved lipid panel after use of trulicity and regular exercise. ?Repeat lipid panel ?

## 2021-05-07 NOTE — Progress Notes (Signed)
? ?Complete physical exam ? ?Patient: Melinda Benton   DOB: January 12, 1960   61 y.o. Female  MRN: [id number] ?Visit Date: 05/07/2021 ? ?Subjective:  ?  ?Chief Complaint  ?Patient presents with  ? Annual Exam  ?  CPE for surgery clearance-No breast or pap exam needed.  ?Pt is not fasting.  ?Knee replacement surgery scheduled 06/02/21  ? ?Melinda Benton is a 61 y.o. female who presents today for a complete physical exam. She reports consuming a low fat diet. Gym/ health club routine includes cardio and low impact aerobics. She generally feels well. She reports sleeping well. She does have additional problems to discuss today.  ?Vision:No ?Dental:Yes ?STD Screen:No ? ?PAP, Bone Density and mammogram completed by Dr. Arelia Sneddon (GYN) ? ?Wt Readings from Last 3 Encounters:  ?05/07/21 166 lb 3.2 oz (75.4 kg)  ?02/10/21 169 lb 9.6 oz (76.9 kg)  ?05/13/20 175 lb (79.4 kg)  ?  ?BP Readings from Last 3 Encounters:  ?05/07/21 110/82  ?02/10/21 132/70  ?05/13/20 118/68  ?  ?Most recent fall risk assessment: ? ?  05/13/2020  ? 11:37 AM  ?Fall Risk   ?Falls in the past year? 0  ?Number falls in past yr: 0  ?Injury with Fall? 0  ?Risk for fall due to : No Fall Risks  ?Follow up Falls evaluation completed  ? ?Most recent depression screenings: ? ?  05/14/2020  ?  8:03 AM 07/11/2019  ?  2:13 PM  ?PHQ 2/9 Scores  ?PHQ - 2 Score 0 0  ? ?HPI  ?[name] is schedule for Right knee replacement 06/02/2021. This will be done by Dr. Sherlean Foot with [facility]. She has L. Knee replacement in 2018 with no complications. No hx of CAD and/or anesthesia complication. 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 She currently takes coumadin and INR is therapeutic. No Hx of GI bleed. ? ?Hypothyroidism ?Reports thyroid nodules per recent thyroid US ?Managed by Dr. Talmage Nap ?Has upcoming at 06/2021 ?Include TSH and T4 to labs today. Fax results to Dr. Talmage Nap. ?Obtain thyroid US ? ?Hyperlipidemia ?She  stopped statin due to improved lipid panel after use of trulicity and regular exercise. ?Repeat lipid panel ? ?Hypercoagulable state (HCC) ?Upcoming R. Knee surgery 06/02/2021. ?Needs lovenox bridge. ?Plan to switch from coumadin clinic monitor to home monitoring machine if approved by insurance. ? ?Age-related osteoporosis without current pathological fracture ?dexa scan completed by Dr. Arelia Sneddon: report requested ?We discussed use of boniva once in combination with calcium 600mg  and vit. D 1000IU daily. ?With upcoming knee surgery, advised to start boniva after knee surgery. ? ? ?Past Medical History:  ?Diagnosis Date  ? Arthritis   ? Clotting disorder (HCC)   ? Cough 05/19/2014  ? H/O blood clots   ? History of chicken pox   ? Migraines   ? Mitral valve prolapse   ? Thyroid disease   ? ?Past Surgical History:  ?Procedure Laterality Date  ? bladder sur    ? CESAREAN SECTION    ? 2  ? CHOLECYSTECTOMY N/A 07/07/2019  ? Procedure: LAPAROSCOPIC CHOLECYSTECTOMY WITH INTRAOPERATIVE CHOLANGIOGRAM;  Surgeon: 09/07/2019, MD;  Location: [facility];  Service: General;  Laterality: N/A;  ? ELBOW SURGERY  2009  ? knee repla Left 07/2016  ? partial knee replacement   ? ROTATOR CUFF REPAIR  2011  ? TONSILLECTOMY  ?1968  ? ?Social History  ? ?Socioeconomic History  ? Marital status:  Married  ?  Spouse name: Not on file  ? Number of children: 3  ? Years of education: 48  ? Highest education level: Not on file  ?Occupational History  ? Occupation: Technical brewer  ?Tobacco Use  ? Smoking status: Never  ? Smokeless tobacco: Never  ?Vaping Use  ? Vaping Use: Never used  ?Substance and Sexual Activity  ? Alcohol use: Yes  ?  Comment: Socially  ? Drug use: No  ? Sexual activity: Yes  ?Other Topics Concern  ? Not on file  ?Social History Narrative  ? Regular exercise-yes  ? Caffeine Use-yes  ? ?Social Determinants of Health  ? ?Financial Resource Strain: Not on file  ?Food Insecurity: Not on file  ?Transportation Needs: Not on file   ?Physical Activity: Not on file  ?Stress: Not on file  ?Social Connections: Not on file  ?Intimate Partner Violence: Not on file  ? ?Family Status  ?Relation Name Status  ? Mother  Deceased  ? Father  Deceased  ? PGF  (Not Specified)  ? Brother  (Not Specified)  ? Emelda Brothers  (Not Specified)  ? Oneal Grout  (Not Specified)  ? PGM  (Not Specified)  ? Cousin  (Not Specified)  ? ?Family History  ?Problem Relation Age of Onset  ? Protein S deficiency Mother   ? Clotting disorder Mother   ? Heart disease Father   ? Diabetes Father   ? Headache Father   ? Colon cancer Paternal Grandfather   ? Stroke Brother   ? Diabetes Paternal Aunt   ? Diabetes Paternal Uncle   ? Colon cancer Paternal Grandmother 57  ? Colon cancer Cousin 35  ?     paternal  ? ?No Known Allergies  ?Patient Care Team: ?Kyndahl Jablon, Bonna Gains, NP as PCP - General (Internal Medicine) ?Marinus Maw, MD as PCP - Cardiology (Cardiology) ?Dorisann Frames, MD as Consulting Physician (Endocrinology)  ? ?Medications: ?Outpatient Medications Prior to Visit  ?Medication Sig  ? buPROPion (WELLBUTRIN XL) 300 MG 24 hr tablet Take 1 tablet (300 mg total) by mouth daily. Need Office Visit for additional refills  ? levothyroxine (SYNTHROID, LEVOTHROID) 88 MCG tablet Take 88 mcg by mouth daily before breakfast.  ? warfarin (COUMADIN) 6 MG tablet TAKE 1 TABLETS (6 MG) DAILY BY MOUTH EXCEPT TAKE 1/2 TABLET (3 MG) ON WEDNESDAYS AND SATURDAYS OR AS DIRECTED BY ANTICOAGULATION CLINIC  ? NON FORMULARY Antifungal (nail)-#1 (Patient not taking: Reported on 05/07/2021)  ? rosuvastatin (CRESTOR) 5 MG tablet Take 1 tablet (5 mg total) by mouth at bedtime. (Patient not taking: Reported on 05/07/2021)  ? [DISCONTINUED] AMBULATORY NON FORMULARY MEDICATION Bone stimulator. Use as directed for fracture (Patient not taking: Reported on 05/07/2021)  ? [DISCONTINUED] enoxaparin (LOVENOX) 120 MG/0.8ML injection Inject 0.8 mLs (120 mg total) into the skin daily for 7 doses.  ? [DISCONTINUED]  Naltrexone-buPROPion HCl ER 8-90 MG TB12 Take 1 tablet by mouth in the morning and at bedtime. (Patient not taking: Reported on 05/07/2021)  ? [DISCONTINUED] pantoprazole (PROTONIX) 20 MG tablet Take 20 mg by mouth daily as needed for heartburn or indigestion. (Patient not taking: Reported on 05/07/2021)  ? [DISCONTINUED] phenazopyridine (PYRIDIUM) 100 MG tablet Take 1 tablet (100 mg total) by mouth 3 (three) times daily as needed for pain. (Patient not taking: Reported on 05/07/2021)  ? ?No facility-administered medications prior to visit.  ? ? ?Review of Systems  ?Constitutional:  Negative for fever.  ?HENT:  Negative for congestion and sore  throat.   ?Eyes:   ?     Negative for visual changes  ?Respiratory:  Negative for cough and shortness of breath.   ?Cardiovascular:  Negative for chest pain, palpitations and leg swelling.  ?Gastrointestinal:  Negative for blood in stool, constipation and diarrhea.  ?Genitourinary:  Negative for dysuria, frequency and urgency.  ?Musculoskeletal:  Negative for myalgias.  ?Skin:  Negative for rash.  ?Neurological:  Negative for dizziness and headaches.  ?Hematological:  Does not bruise/bleed easily.  ?Psychiatric/Behavioral:  Negative for suicidal ideas. The patient is not nervous/anxious.   ? ? ?   ?Objective:  ?BP 110/82 (BP Location: Left Arm, Patient Position: Sitting, Cuff Size: Large)   Pulse 80   Temp 97.9 ?F (36.6 ?C) (Temporal)   Ht 5\' 8"  (1.727 m)   Wt 166 lb 3.2 oz (75.4 kg)   LMP 05/01/2012   SpO2 97%   BMI 25.27 kg/m?  ?  ?ECG: NSR. No change compared to 2021 ECG. ? ? ? ?Physical Exam ?HENT:  ?   Right Ear: Tympanic membrane, ear canal and external ear normal.  ?   Left Ear: Tympanic membrane, ear canal and external ear normal.  ?Eyes:  ?   Extraocular Movements: Extraocular movements intact.  ?   Conjunctiva/sclera: Conjunctivae normal.  ?Cardiovascular:  ?   Rate and Rhythm: Normal rate and regular rhythm.  ?   Pulses: Normal pulses.  ?   Heart sounds: Normal  heart sounds.  ?Pulmonary:  ?   Effort: Pulmonary effort is normal.  ?   Breath sounds: Normal breath sounds.  ?Abdominal:  ?   General: Bowel sounds are normal.  ?   Palpations: Abdomen is soft.  ?Genitourinary: ?   Comments:

## 2021-05-10 ENCOUNTER — Telehealth: Payer: Self-pay

## 2021-05-10 ENCOUNTER — Other Ambulatory Visit (INDEPENDENT_AMBULATORY_CARE_PROVIDER_SITE_OTHER): Payer: PRIVATE HEALTH INSURANCE

## 2021-05-10 ENCOUNTER — Encounter: Payer: Self-pay | Admitting: Nurse Practitioner

## 2021-05-10 DIAGNOSIS — Z01818 Encounter for other preprocedural examination: Secondary | ICD-10-CM

## 2021-05-10 DIAGNOSIS — Z0001 Encounter for general adult medical examination with abnormal findings: Secondary | ICD-10-CM | POA: Diagnosis not present

## 2021-05-10 DIAGNOSIS — E782 Mixed hyperlipidemia: Secondary | ICD-10-CM

## 2021-05-10 DIAGNOSIS — R739 Hyperglycemia, unspecified: Secondary | ICD-10-CM

## 2021-05-10 DIAGNOSIS — E039 Hypothyroidism, unspecified: Secondary | ICD-10-CM | POA: Diagnosis not present

## 2021-05-10 LAB — LIPID PANEL
Cholesterol: 203 mg/dL — ABNORMAL HIGH (ref 0–200)
HDL: 57.6 mg/dL (ref >39.00)
LDL Cholesterol: 119 mg/dL — ABNORMAL HIGH (ref 0–99)
NonHDL: 145.44
Total CHOL/HDL Ratio: 4
Triglycerides: 134 mg/dL (ref 0.0–149.0)
VLDL: 26.8 mg/dL (ref 0.0–40.0)

## 2021-05-10 LAB — COMPREHENSIVE METABOLIC PANEL
ALT: 21 U/L (ref 0–35)
AST: 23 U/L (ref 0–37)
Albumin: 4.2 g/dL (ref 3.5–5.2)
Alkaline Phosphatase: 47 U/L (ref 39–117)
BUN: 13 mg/dL (ref 6–23)
CO2: 26 mEq/L (ref 19–32)
Calcium: 8.9 mg/dL (ref 8.4–10.5)
Chloride: 106 mEq/L (ref 96–112)
Creatinine, Ser: 0.89 mg/dL (ref 0.40–1.20)
GFR: 70.21 mL/min (ref >60.00)
Glucose, Bld: 106 mg/dL — ABNORMAL HIGH (ref 70–99)
Potassium: 4 mEq/L (ref 3.5–5.1)
Sodium: 140 mEq/L (ref 135–145)
Total Bilirubin: 0.5 mg/dL (ref 0.2–1.2)
Total Protein: 6.8 g/dL (ref 6.0–8.3)

## 2021-05-10 LAB — CBC
HCT: 40.6 % (ref 36.0–46.0)
Hemoglobin: 13.5 g/dL (ref 12.0–15.0)
MCHC: 33.3 g/dL (ref 30.0–36.0)
MCV: 90.7 fl (ref 78.0–100.0)
Platelets: 190 10*3/uL (ref 150.0–400.0)
RBC: 4.48 Mil/uL (ref 3.87–5.11)
RDW: 12.7 % (ref 11.5–15.5)
WBC: 3.4 10*3/uL — ABNORMAL LOW (ref 4.0–10.5)

## 2021-05-10 LAB — HEMOGLOBIN A1C: Hgb A1c MFr Bld: 5.4 % (ref 4.6–6.5)

## 2021-05-10 LAB — T4, FREE: Free T4: 0.93 ng/dL (ref 0.60–1.60)

## 2021-05-10 LAB — TSH: TSH: 2.07 u[IU]/mL (ref 0.35–5.50)

## 2021-05-10 NOTE — Telephone Encounter (Signed)
Per PCP, pt would like to get an INR machine to test at home. Filled out order for home INR machine with [employer]. PCP signed order and it was faxed to [employer] at [phone] today. ? ?Contacted pt and advised the order has been sent to [employer] and they will contact her with progress on the home machine. Advised coumadin clinic would still manage warfarin dosing but that would be done over the phone. Advised pt to f/u with coumadin clinic when she hears from the company. If insurance will not cover machine from this company there is another company an order can be sent.  ?Advised of the lovenox bridge. She said the surgery is scheduled tentatively for 5/31 and will let the coumadin clinic know if date changes. Advised PCP has authorized lovenox bridge but the lovenox prescription will not be sent in until the date is absolute for the surgery due to changes may affect the number of syringes needed for the bridge. Pt verbalized understanding and was very appreciative.  ? ?

## 2021-05-10 NOTE — Telephone Encounter (Signed)
Form updated and faxed

## 2021-05-10 NOTE — Telephone Encounter (Signed)
Pt called concerning an upcoming surgery at ? ?[facility]  ?[phone]  (P) ?[phone] (F) ? ?They rec'd the surgery clearance form but the box saying she's cleared for surgery was not checked. They also need Office notes for that day latest lab results. ? ?Thanks! ? ? ?

## 2021-05-10 NOTE — Telephone Encounter (Signed)
Per PCP, pt is having knee surgery on 5/31 and will need a lovenox bridge.  ? ?Wt: 75 kg ?CrCl: 79.59 mL/min ?Diagnosis: Hypercoagulable state, anticoagulation long-term use ? ?Lovenox; 120 mg QD, need 8 syringes ? ?Lovenox Bridge Schedule: ?5/23:INR check at [facility] ? ?5/26: Take last dose of coumadin ?5/27: NO coumadin, NO Lovenox ?5/28: NO coumadin, Lovenox once in AM ?5/29: NO coumadin, Lovenox once in AM ?5/30: NO coumadin, Lovenox once in AM (before 7 AM) ? ?5/31: SURGERY; NO COUMADIN, NO LOVENOX ? ?6/1: Take 1 1/2 tablets (9 mg) coumadin, Lovenox once in AM ?6/2: Take 1 1/2 tablets (9 mg) coumadin, Lovenox once in AM ?6/3: Take 1 tablet (6 mg), Lovenox once in the AM ?6/4: Resume normal coumadin dosing, Lovenox in the AM ?6/5: Normal coumadin dose, Lovenox in the AM ?6/6: Recheck INR at [facility], no coumadin, no lovenox INR recheck.  ? ? ? ? ?

## 2021-05-12 ENCOUNTER — Encounter: Payer: Self-pay | Admitting: Internal Medicine

## 2021-05-12 ENCOUNTER — Telehealth: Payer: Self-pay | Admitting: Internal Medicine

## 2021-05-12 NOTE — Telephone Encounter (Signed)
Pt states that she is having Knee Replacement Surgery on 06/02/21. Pt is going to have the office that is doing the procedure fax over a Clearance that needs to filled out. Pt also wants to know if she needs to be seen by Dr. Ladona Ridgel before having this procedure done. Please advise ?

## 2021-05-13 ENCOUNTER — Telehealth: Payer: Self-pay | Admitting: *Deleted

## 2021-05-13 NOTE — Telephone Encounter (Signed)
? ?  Pre-operative Risk Assessment  ?  ?Patient Name: Melinda Benton  ?DOB: 09/21/1960 ?MRN: [id number]  ? ?  ?PT LAST SEEN IN 2021; SHE WILL NEED AN APPT ?Request for Surgical Clearance   ? ?Procedure:   RIGHT MEDICAL PARTIAL KNEE ARTHROPLASTY ? ?Date of Surgery:  Clearance 06/02/21                              ?   ?Surgeon:  DR. Georgena Spurling ?Surgeon's Group or Practice Name:  [facility]  ?Phone number:  [phone] ?Fax number:  [phone] ?  ?Type of Clearance Requested:   ?- Medical  ?- Pharmacy:  Hold Warfarin (Coumadin) ( THIS IS MANAGED BY PCP, NOT CARDIOLOGY) ?  ?Type of Anesthesia:  Spinal ?  ?Additional requests/questions:   ? ?Signed, ?Danielle Rankin   ?05/13/2021, 8:48 AM  ? ?

## 2021-05-13 NOTE — Telephone Encounter (Signed)
Message sent to [facility]. EP Scheduler.  ?

## 2021-05-13 NOTE — Telephone Encounter (Signed)
See mychart message. Duplicate.

## 2021-05-13 NOTE — Telephone Encounter (Signed)
Pt has been scheduled to see Otilio Saber, [facility] 05/26/21 @ 8:20 for pre op clearance. Pt requested to be put ion cancellation list as well, this has been done. I will send notes to [facility] for upcoming appt. Will send FYI to requesting office the pt has appt  ?

## 2021-05-13 NOTE — Telephone Encounter (Signed)
Primary Cardiologist:Melinda Ladona Ridgel, MD ? ?Chart reviewed as part of pre-operative protocol coverage. Because of Melinda Benton's past medical history and time since last visit, he/she will require a follow-up visit in order to better assess preoperative cardiovascular risk. ? ?Pre-op covering staff: ?- Please schedule appointment and call patient to inform them. ?- Please contact requesting surgeon's office via preferred method (i.e, phone, fax) to inform them of need for appointment prior to surgery. ? ?If applicable, this message will also be routed to pharmacy pool and/or primary cardiologist for input on holding anticoagulant/antiplatelet agent as requested below so that this information is available at time of patient's appointment.  ? ?Ronney Asters, NP  ?05/13/2021, 11:17 AM  ? ?

## 2021-05-14 ENCOUNTER — Ambulatory Visit (INDEPENDENT_AMBULATORY_CARE_PROVIDER_SITE_OTHER): Payer: PRIVATE HEALTH INSURANCE | Admitting: Student

## 2021-05-14 ENCOUNTER — Encounter: Payer: Self-pay | Admitting: Student

## 2021-05-14 VITALS — BP 118/68 | HR 72 | Ht 68.0 in | Wt 166.0 lb

## 2021-05-14 DIAGNOSIS — I10 Essential (primary) hypertension: Secondary | ICD-10-CM | POA: Diagnosis not present

## 2021-05-14 DIAGNOSIS — R002 Palpitations: Secondary | ICD-10-CM | POA: Diagnosis not present

## 2021-05-14 MED ORDER — METOPROLOL SUCCINATE ER 25 MG PO TB24: 25.0000 mg | ORAL_TABLET | Freq: Every day | ORAL | 3 refills | Status: AC | PRN

## 2021-05-14 NOTE — Telephone Encounter (Signed)
We had a cancellation on our schedule today so I called the pt and she is coming in today at 10:40.  ?

## 2021-05-14 NOTE — Progress Notes (Signed)
? ?PCP:  Nche, Bonna Gains, NP ?Primary Cardiologist: Lewayne Bunting, MD ?Electrophysiologist: Lewayne Bunting, MD  ? ?Melinda Benton is a 61 y.o. female seen today for Lewayne Bunting, MD for cardiac clearance.  Since last being seen in our clinic the patient reports doing very well from a cardiac perspective. She has not had palpitations in years. she denies chest pain, palpitations, dyspnea, PND, orthopnea, nausea, vomiting, dizziness, syncope, edema, weight gain, or early satiety. ? ?Past Medical History:  ?Diagnosis Date  ? Arthritis   ? Clotting disorder (HCC)   ? Cough 05/19/2014  ? H/O blood clots   ? History of chicken pox   ? Migraines   ? Mitral valve prolapse   ? Thyroid disease   ? ?Past Surgical History:  ?Procedure Laterality Date  ? bladder sur    ? CESAREAN SECTION    ? 2  ? CHOLECYSTECTOMY N/A 07/07/2019  ? Procedure: LAPAROSCOPIC CHOLECYSTECTOMY WITH INTRAOPERATIVE CHOLANGIOGRAM;  Surgeon: Griselda Miner, MD;  Location: [facility];  Service: General;  Laterality: N/A;  ? ELBOW SURGERY  2009  ? knee repla Left 07/2016  ? partial knee replacement   ? ROTATOR CUFF REPAIR  2011  ? TONSILLECTOMY  ?1968  ? ? ?Current Outpatient Medications  ?Medication Sig Dispense Refill  ? buPROPion (WELLBUTRIN XL) 300 MG 24 hr tablet Take 1 tablet (300 mg total) by mouth daily. Need Office Visit for additional refills 90 tablet 0  ? ibandronate (BONIVA) 150 MG tablet Take 1 tablet (150 mg total) by mouth every 30 (thirty) days. Take in the morning with a full glass of water, on an empty stomach, and do not take anything else by mouth or lie down for the next 30 min. 3 tablet 3  ? levothyroxine (SYNTHROID, LEVOTHROID) 88 MCG tablet Take 88 mcg by mouth daily before breakfast.    ? pantoprazole (PROTONIX) 20 MG tablet Take 1 tablet (20 mg total) by mouth daily as needed for heartburn or indigestion. 90 tablet 0  ? warfarin (COUMADIN) 6 MG tablet TAKE 1 TABLETS (6 MG) DAILY BY MOUTH EXCEPT TAKE 1/2 TABLET (3 MG) ON WEDNESDAYS  AND SATURDAYS OR AS DIRECTED BY ANTICOAGULATION CLINIC 120 tablet 1  ? ?No current facility-administered medications for this visit.  ? ? ?No Known Allergies ? ?Social History  ? ?Socioeconomic History  ? Marital status: Married  ?  Spouse name: Not on file  ? Number of children: 3  ? Years of education: 65  ? Highest education level: Not on file  ?Occupational History  ? Occupation: Technical brewer  ?Tobacco Use  ? Smoking status: Never  ? Smokeless tobacco: Never  ?Vaping Use  ? Vaping Use: Never used  ?Substance and Sexual Activity  ? Alcohol use: Yes  ?  Comment: Socially  ? Drug use: No  ? Sexual activity: Yes  ?Other Topics Concern  ? Not on file  ?Social History Narrative  ? Regular exercise-yes  ? Caffeine Use-yes  ? ?Social Determinants of Health  ? ?Financial Resource Strain: Not on file  ?Food Insecurity: Not on file  ?Transportation Needs: Not on file  ?Physical Activity: Not on file  ?Stress: Not on file  ?Social Connections: Not on file  ?Intimate Partner Violence: Not on file  ? ? ? ?Review of Systems: ?All other systems reviewed and are otherwise negative except as noted above. ? ?Physical Exam: ?Vitals:  ? 05/14/21 1035  ?BP: 118/68  ?Pulse: 72  ?SpO2: 99%  ?Weight: 166 lb (  75.3 kg)  ?Height: 5\' 8"  (1.727 m)  ? ? ?GEN- The patient is well appearing, alert and oriented x 3 today.   ?HEENT: normocephalic, atraumatic; sclera clear, conjunctiva pink; hearing intact; oropharynx clear; neck supple, no JVP ?Lymph- no cervical lymphadenopathy ?Lungs- Clear to ausculation bilaterally, normal work of breathing.  No wheezes, rales, rhonchi ?Heart- Regular rate and rhythm, no murmurs, rubs or gallops, PMI not laterally displaced ?GI- soft, non-tender, non-distended, bowel sounds present, no hepatosplenomegaly ?Extremities- no clubbing, cyanosis, or edema; DP/PT/radial pulses 2+ bilaterally ?MS- no significant deformity or atrophy ?Skin- warm and dry, no rash or lesion ?Psych- euthymic mood, full  affect ?Neuro- strength and sensation are intact ? ?EKG is not ordered. Personal review of EKG from  05/07/2021  shows NSR at 69 bpm ? ?Additional studies reviewed include: ?Previous EP office notes.  ? ?Assessment and Plan: ? ?1. Palpitations ?Well controlled ?Refill as needed toprol. Prefers to avoid daily medication ? ?2. HTN ?Stable on current regimen  ? ?3. H/o PE ?Managed on coumadin. Procedure to be bridged with lovenox per pt ? ?4. Cardiac Clearance for RIGHT MEDICAL PARTIAL KNEE ARTHROPLASTY ?Pt at low risk from cardiac perspective. Coumadin instructions already given.  ?She is OK to proceed without further cardiac work up.  ? ?Follow up with Dr. 07/07/2021 in 12 months, sooner as needed.   ? ?Ladona Ridgel, PA-C  ?05/14/21 ?10:47 AM  ?

## 2021-05-17 NOTE — Telephone Encounter (Signed)
Contacted [employer] representative at [employer] and she reported they did not receive an order for home monitor for this pt. Refaxed paperwork. ?

## 2021-05-25 ENCOUNTER — Ambulatory Visit (INDEPENDENT_AMBULATORY_CARE_PROVIDER_SITE_OTHER): Payer: PRIVATE HEALTH INSURANCE

## 2021-05-25 DIAGNOSIS — Z7901 Long term (current) use of anticoagulants: Secondary | ICD-10-CM

## 2021-05-25 LAB — POCT INR: INR: 2.9 (ref 2.0–3.0)

## 2021-05-25 MED ORDER — ENOXAPARIN SODIUM 120 MG/0.8ML IJ SOSY: PREFILLED_SYRINGE | INTRAMUSCULAR | 0 refills | Status: DC

## 2021-05-25 NOTE — Patient Instructions (Addendum)
Pre visit review using our clinic review tool, if applicable. No additional management support is needed unless otherwise documented below in the visit note.  Continue 6mg  daily except take 3mg  on Wed and Sat. Recheck ion 6/6.  5/26: Take last dose of coumadin 5/27: NO coumadin, NO Lovenox 5/28: NO coumadin, Lovenox once in AM 5/29: NO coumadin, Lovenox once in AM 5/30: NO coumadin, Lovenox once in AM (before 7 AM)   5/31: SURGERY; NO COUMADIN, NO LOVENOX   6/1: Take 1 1/2 tablets (9 mg) coumadin, Lovenox once in AM 6/2: Take 1 1/2 tablets (9 mg) coumadin, Lovenox once in AM 6/3: Take 1 tablet (6 mg), Lovenox once in the AM 6/4: Resume normal coumadin dosing, Lovenox in the AM 6/5: Normal coumadin dose, Lovenox in the AM 6/6: Recheck INR at [facility], no coumadin, no lovenox INR recheck.

## 2021-05-25 NOTE — Progress Notes (Signed)
Continue 6mg  daily except take 3mg  on Wed and Sat. Recheck ion 6/6.  5/26: Take last dose of coumadin 5/27: NO coumadin, NO Lovenox 5/28: NO coumadin, Lovenox once in AM 5/29: NO coumadin, Lovenox once in AM 5/30: NO coumadin, Lovenox once in AM (before 7 AM)   5/31: SURGERY; NO COUMADIN, NO LOVENOX   6/1: Take 1 1/2 tablets (9 mg) coumadin, Lovenox once in AM 6/2: Take 1 1/2 tablets (9 mg) coumadin, Lovenox once in AM 6/3: Take 1 tablet (6 mg), Lovenox once in the AM 6/4: Resume normal coumadin dosing, Lovenox in the AM 6/5: Normal coumadin dose, Lovenox in the AM 6/6: Recheck INR at [facility], no coumadin, no lovenox INR recheck.    Sent in lovenox.

## 2021-05-26 ENCOUNTER — Ambulatory Visit: Payer: PRIVATE HEALTH INSURANCE | Admitting: Student

## 2021-06-04 ENCOUNTER — Telehealth: Payer: Self-pay

## 2021-06-04 DIAGNOSIS — Z7901 Long term (current) use of anticoagulants: Secondary | ICD-10-CM

## 2021-06-04 MED ORDER — ENOXAPARIN SODIUM 120 MG/0.8ML IJ SOSY: PREFILLED_SYRINGE | INTRAMUSCULAR | 0 refills | Status: DC

## 2021-06-04 NOTE — Telephone Encounter (Signed)
Pt LVM reporting she was trying to give herself the lovenox injection and dropped it on the floor and the needle bent. Pt is requesting one syring be sent in to [facility].  Sent in one syringe. Advised pt of script

## 2021-06-07 MED ORDER — ENOXAPARIN SODIUM 120 MG/0.8ML IJ SOSY: PREFILLED_SYRINGE | INTRAMUSCULAR | 0 refills | Status: DC

## 2021-06-07 NOTE — Telephone Encounter (Signed)
Pt reports pharmacy reports they did not receive script for lovenox. Pt requested it be sent to [facility] again. Sent script in again.  Contacted pharmacy and spoke with Fae Pippin who reports he did receive the script and will work on filling it now.

## 2021-06-07 NOTE — Addendum Note (Signed)
Addended by: Sherrie George on: 06/07/2021 09:17 AM   Modules accepted: Orders

## 2021-06-07 NOTE — Telephone Encounter (Signed)
Contacted [employer] and spoke with Karren Burly. She did not see any orer in her system. She transferred the call to the local representative and advised to also fax the order to [phone]. Spoke with local rep, Lynden Ang, who reports she does not have the order either. She requested the order be re-emailed to her.  Emailed order again to her email. She reports she will mark it urgent.

## 2021-06-08 ENCOUNTER — Ambulatory Visit (INDEPENDENT_AMBULATORY_CARE_PROVIDER_SITE_OTHER): Payer: PRIVATE HEALTH INSURANCE

## 2021-06-08 DIAGNOSIS — Z7901 Long term (current) use of anticoagulants: Secondary | ICD-10-CM | POA: Diagnosis not present

## 2021-06-08 LAB — POCT INR: INR: 4.5 — AB (ref 2.0–3.0)

## 2021-06-08 NOTE — Patient Instructions (Addendum)
Pre visit review using our clinic review tool, if applicable. No additional management support is needed unless otherwise documented below in the visit note.  Hold dose tomorrow and hold dose the day after tomorrow and then continue 6mg  daily except take 3mg  on Wed and Sat. Recheck ion 6/20.

## 2021-06-08 NOTE — Progress Notes (Signed)
Pt just finished a lovenox bridge for knee surgery last week. Pt reports a lot of pain since surgery but trying not to use the pain medication prescribed.  Pt was stable on current dose before the lovenox bridge so pt will hold dose for 2 days and then return to normal dosing. Pt had already taken her warfarin today.  Hold dose tomorrow and hold dose the day after tomorrow and then continue 6mg  daily except take 3mg  on Wed and Sat. Recheck ion 6/20.

## 2021-06-10 ENCOUNTER — Ambulatory Visit: Payer: PRIVATE HEALTH INSURANCE | Attending: Orthopedic Surgery | Admitting: Physical Therapy

## 2021-06-10 ENCOUNTER — Encounter: Payer: Self-pay | Admitting: Physical Therapy

## 2021-06-10 DIAGNOSIS — R262 Difficulty in walking, not elsewhere classified: Secondary | ICD-10-CM | POA: Insufficient documentation

## 2021-06-10 DIAGNOSIS — R600 Localized edema: Secondary | ICD-10-CM | POA: Diagnosis present

## 2021-06-10 DIAGNOSIS — M25561 Pain in right knee: Secondary | ICD-10-CM | POA: Diagnosis present

## 2021-06-10 DIAGNOSIS — M6281 Muscle weakness (generalized): Secondary | ICD-10-CM | POA: Insufficient documentation

## 2021-06-10 DIAGNOSIS — M25661 Stiffness of right knee, not elsewhere classified: Secondary | ICD-10-CM | POA: Insufficient documentation

## 2021-06-10 NOTE — Therapy (Signed)
OUTPATIENT PHYSICAL THERAPY LOWER EXTREMITY EVALUATION   Patient Name: Melinda Benton MRN: [id number] DOB:1960/07/02, 61 y.o., female Today's Date: 06/10/2021   PT End of Session - 06/10/21 1717     Visit Number 1    Number of Visits 25    Date for PT Re-Evaluation 08/05/21    Authorization Type [employer]    Authorization Time Period 06/10/21 to 08/05/21    Authorization - Number of Visits 30    PT Start Time 1620    PT Stop Time 1700    PT Time Calculation (min) 40 min    Activity Tolerance Patient tolerated treatment well    [facility] for tasks assessed/performed             Past Medical History:  Diagnosis Date   Arthritis    Clotting disorder (HCC)    Cough 05/19/2014   H/O blood clots    History of chicken pox    Migraines    Mitral valve prolapse    Thyroid disease    Past Surgical History:  Procedure Laterality Date   bladder sur     CESAREAN SECTION     2   CHOLECYSTECTOMY N/A 07/07/2019   Procedure: LAPAROSCOPIC CHOLECYSTECTOMY WITH INTRAOPERATIVE CHOLANGIOGRAM;  Surgeon: Griselda Mineroth, Paul III, MD;  Location: [facility];  Service: General;  Laterality: N/A;   ELBOW SURGERY  2009   knee repla Left 07/2016   partial knee replacement    ROTATOR CUFF REPAIR  2011   TONSILLECTOMY  ?1968   Patient Active Problem List   Diagnosis Date Noted   Age-related osteoporosis without current pathological fracture 05/07/2021   Protein S deficiency (HCC) 11/24/2020   Family history of diabetes mellitus 04/21/2020   Hyperlipidemia 04/21/2020   Nontoxic single thyroid nodule 04/21/2020   Weight gain 04/21/2020   Closed nondisplaced fracture of proximal phalanx of lesser toe of right foot 02/18/2020   Closed nondisplaced fracture of proximal phalanx of lesser toe of left foot 12/02/2019   S/P laparoscopic cholecystectomy 07/11/2019   Family history of colon cancer 11/05/2018   Hypothyroidism 11/05/2018   Chronic left shoulder pain 03/06/2018   GAD (generalized  anxiety disorder) 03/20/2017   S/P left unicompartmental knee replacement 07/19/2016   Chronic pain of right knee 06/07/2016   Spider varicose veins 09/01/2015   Palpitations 10/29/2013   Arthritis 10/09/2013   Anticoagulant long-term use 08/04/2010   Pulmonary embolus (HCC) 02/15/2010   Hypercoagulable state (HCC) 02/15/2010    PCP: Anne NgNche, Charlotte Lum   REFERRING PROVIDER: Dannielle HuhLucey, Steve, MD   REFERRING DIAG: s/p R Medial Partial Knee-5/31 Post-op PT Call patient to schedule. Surg is on 5/31, start PT=6/8 pm or 06/11/21.   THERAPY DIAG:  Acute pain of right knee - Plan: PT plan of care cert/re-cert  Localized edema - Plan: PT plan of care cert/re-cert  Muscle weakness (generalized) - Plan: PT plan of care cert/re-cert  Stiffness of right knee, not elsewhere classified - Plan: PT plan of care cert/re-cert  Difficulty in walking, not elsewhere classified - Plan: PT plan of care cert/re-cert  Rationale for Evaluation and Treatment Rehabilitation  ONSET DATE: 05/13/2021   SUBJECTIVE:   SUBJECTIVE STATEMENT: Had my knee surgery about a week ago, it is very painful but this is how my other knee replacement was too. Only had a partial knee replacement this time.   PERTINENT HISTORY: Hx PE, OA, hx phalanx fractures B feet, hypercoaguloable state, L shoulder pain, TKR   PAIN:  Are  you having pain? Yes: NPRS scale: 8/10 Pain location: R knee  Pain description: post-op pain  Aggravating factors: bearing weight Relieving factors: ice   PRECAUTIONS: None WBAT   WEIGHT BEARING RESTRICTIONS No  FALLS:  Has patient fallen in last 6 months? 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)  PLOF: Independent,  Independent with basic ADLs, Independent with gait, and Independent with transfers  PATIENT GOALS get back to 100%, get better ROM    OBJECTIVE:     PATIENT SURVEYS:  FOTO 11.9  COGNITION:  Overall cognitive status: Within functional limits for tasks assessed        LOWER EXTREMITY ROM:  Active ROM Right eval Left eval  Hip flexion    Hip extension    Hip abduction    Hip adduction    Hip internal rotation    Hip external rotation    Knee flexion 42   Knee extension 5   Ankle dorsiflexion    Ankle plantarflexion    Ankle inversion    Ankle eversion     (Blank rows = not tested)  LOWER EXTREMITY MMT:  MMT Right eval Left eval  Hip flexion 2 4+  Hip extension    Hip abduction    Hip adduction    Hip internal rotation    Hip external rotation    Knee flexion 2+ 4+  Knee extension 2 4+  Ankle dorsiflexion 3- 5  Ankle plantarflexion    Ankle inversion    Ankle eversion     (Blank rows = not tested)   GAIT: Distance walked: in clinic distances Assistive device utilized: Environmental consultant - 2 wheeled Level of assistance: Modified independence Comments: antalgic, very limited knee flexion, limited heel toe pattern and step to pattern     TODAY'S TREATMENT: Stair training with RW and MinA for safety  Knee flexion AAROM with sheet 1x10 RLE Supine hip ABD 1x10 R LE  Quad sets 1x5 R LE    PATIENT EDUCATION:  Education details: exam findings, POC, HEP  Person educated: Patient Education method: Programmer, multimedia, Facilities manager, and Handouts Education comprehension: verbalized understanding, returned demonstration, and needs further education   HOME EXERCISE PROGRAM: [id number]  ASSESSMENT:  CLINICAL IMPRESSION: Patient is a 61 y.o. female who was seen today for physical therapy evaluation and treatment for partial total knee. Very pain limited today and extremely weak, also has significant gait impairment today as well. R quad muscle is not activating after  surgery just yet. Practiced stairs with RW as it does not sound like they are doing this safely at home as is. Will benefit from skilled PT services to address all identified impairments and assist in return to optimal level of function.    OBJECTIVE IMPAIRMENTS Abnormal gait, decreased activity tolerance, decreased balance, decreased endurance, decreased knowledge of use of DME, decreased mobility, difficulty walking, decreased ROM, decreased strength, hypomobility, increased edema, increased fascial restrictions, impaired perceived functional ability, increased muscle spasms, impaired flexibility, impaired UE functional use, improper body mechanics, postural dysfunction, and pain.   ACTIVITY LIMITATIONS sitting, standing, squatting, stairs, transfers, bed mobility, and locomotion level  PARTICIPATION LIMITATIONS: community activity, occupation, and yard work  PERSONAL FACTORS Behavior pattern, Past/current experiences, and Time since onset of injury/illness/exacerbation  are also affecting patient's functional outcome.   REHAB POTENTIAL: Good  CLINICAL DECISION MAKING: Stable/uncomplicated  EVALUATION COMPLEXITY: Low   GOALS: Goals reviewed with patient? Yes  SHORT TERM GOALS: Target date: 07/08/2021  Will be compliant with appropriate HEP  Baseline: Goal status: INITIAL  2.  R knee flexion ROM to be at least 90 degrees  Baseline:  Goal status: INITIAL  3.  Will be able to navigate steps in the front of her house with [facility] and no more than stand by assist  Baseline:  Goal status: INITIAL  4.  Pain in R knee to be no more than 6/10  Baseline:  Goal status: INITIAL   LONG TERM GOALS: Target date: 08/05/2021   MMT in R LE to be at least 4/5  Baseline:  Goal status: INITIAL  2.  Pain in R knee to be no more than 3/10  Baseline:  Goal status: INITIAL  3.  Will be able to reciprocally ascend and descend steps with no device and no increase in pain  Baseline:  Goal status:  INITIAL  4.  Will be able to ambulate community distances with no device and R knee pain no more than 3/10 Baseline:  Goal status: INITIAL  5.  R knee extension ROM to be 0 degrees and flexion ROM to be at least 110 degrees  Baseline:  Goal status: INITIAL  6.  FOTO score to be at least 50 to show improvement in status  Baseline:  Goal status: INITIAL   PLAN: PT FREQUENCY:  2-3 times per week   PT DURATION: 8 weeks  PLANNED INTERVENTIONS: Therapeutic exercises, Therapeutic activity, Neuromuscular re-education, Balance training, Gait training, Patient/Family education, Joint mobilization, DME instructions, Dry Needling, Electrical stimulation, Cryotherapy, Moist heat, scar mobilization, Taping, Vasopneumatic device, Ultrasound, Ionotophoresis 4mg /ml Dexamethasone, Manual therapy, and Re-evaluation  PLAN FOR NEXT SESSION: ROM, strength, gait training, pain and edema control, continue to practice steps with RW    Kaedin Hicklin U PT DPT PN2  06/10/2021, 5:25 PM

## 2021-06-11 NOTE — Therapy (Signed)
OUTPATIENT PHYSICAL THERAPY LOWER EXTREMITY EVALUATION   Patient Name: Melinda Benton MRN: [id number] DOB:06/21/1960, 61 y.o., female Today's Date: 06/11/2021     Past Medical History:  Diagnosis Date   Arthritis    Clotting disorder (HCC)    Cough 05/19/2014   H/O blood clots    History of chicken pox    Migraines    Mitral valve prolapse    Thyroid disease    Past Surgical History:  Procedure Laterality Date   bladder sur     CESAREAN SECTION     2   CHOLECYSTECTOMY N/A 07/07/2019   Procedure: LAPAROSCOPIC CHOLECYSTECTOMY WITH INTRAOPERATIVE CHOLANGIOGRAM;  Surgeon: Griselda Mineroth, Paul III, MD;  Location: [facility];  Service: General;  Laterality: N/A;   ELBOW SURGERY  2009   knee repla Left 07/2016   partial knee replacement    ROTATOR CUFF REPAIR  2011   TONSILLECTOMY  ?1968   Patient Active Problem List   Diagnosis Date Noted   Age-related osteoporosis without current pathological fracture 05/07/2021   Protein S deficiency (HCC) 11/24/2020   Family history of diabetes mellitus 04/21/2020   Hyperlipidemia 04/21/2020   Nontoxic single thyroid nodule 04/21/2020   Weight gain 04/21/2020   Closed nondisplaced fracture of proximal phalanx of lesser toe of right foot 02/18/2020   Closed nondisplaced fracture of proximal phalanx of lesser toe of left foot 12/02/2019   S/P laparoscopic cholecystectomy 07/11/2019   Family history of colon cancer 11/05/2018   Hypothyroidism 11/05/2018   Chronic left shoulder pain 03/06/2018   GAD (generalized anxiety disorder) 03/20/2017   S/P left unicompartmental knee replacement 07/19/2016   Chronic pain of right knee 06/07/2016   Spider varicose veins 09/01/2015   Palpitations 10/29/2013   Arthritis 10/09/2013   Anticoagulant long-term use 08/04/2010   Pulmonary embolus (HCC) 02/15/2010   Hypercoagulable state (HCC) 02/15/2010    PCP: Anne NgNche, Charlotte Lum   REFERRING PROVIDER: Dannielle HuhLucey, Steve, MD   REFERRING DIAG: s/p R Medial Partial  Knee-5/31 Post-op PT Call patient to schedule. Surg is on 5/31, start PT=6/8 pm or 06/11/21.   THERAPY DIAG:  No diagnosis found.  Rationale for Evaluation and Treatment Rehabilitation  ONSET DATE: 05/13/2021   SUBJECTIVE:   SUBJECTIVE STATEMENT: Had my knee surgery about a week ago, it is very painful but this is how my other knee replacement was too. Only had a partial knee replacement this time.   PERTINENT HISTORY: Hx PE, OA, hx phalanx fractures B feet, hypercoaguloable state, L shoulder pain, TKR   PAIN:  Are you having pain? Yes: NPRS scale: 8/10 Pain location: R knee  Pain description: post-op pain  Aggravating factors: bearing weight Relieving factors: ice   PRECAUTIONS: None WBAT   WEIGHT BEARING RESTRICTIONS No  FALLS:  Has patient fallen in last 6 months? 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)  PLOF: Independent, Independent with basic ADLs, Independent with gait, and Independent with transfers  PATIENT GOALS get back to 100%, get better ROM    OBJECTIVE:     PATIENT SURVEYS:  FOTO 11.9  COGNITION:  Overall cognitive status: Within functional limits for tasks assessed        LOWER EXTREMITY ROM:  Active ROM Right eval Left  eval  Hip flexion    Hip extension    Hip abduction    Hip adduction    Hip internal rotation    Hip external rotation    Knee flexion 42   Knee extension 5   Ankle dorsiflexion    Ankle plantarflexion    Ankle inversion    Ankle eversion     (Blank rows = not tested)  LOWER EXTREMITY MMT:  MMT Right eval Left eval  Hip flexion 2 4+  Hip extension    Hip abduction    Hip adduction    Hip internal rotation    Hip external  rotation    Knee flexion 2+ 4+  Knee extension 2 4+  Ankle dorsiflexion 3- 5  Ankle plantarflexion    Ankle inversion    Ankle eversion     (Blank rows = not tested)   GAIT: Distance walked: in clinic distances Assistive device utilized: Environmental consultant - 2 wheeled Level of assistance: Modified independence Comments: antalgic, very limited knee flexion, limited heel toe pattern and step to pattern     TODAY'S TREATMENT: Stair training with RW and MinA for safety  Knee flexion AAROM with sheet 1x10 RLE Supine hip ABD 1x10 R LE  Quad sets 1x5 R LE    PATIENT EDUCATION:  Education details: exam findings, POC, HEP  Person educated: Patient Education method: Programmer, multimedia, Facilities manager, and Handouts Education comprehension: verbalized understanding, returned demonstration, and needs further education   HOME EXERCISE PROGRAM: [id number]  ASSESSMENT:  CLINICAL IMPRESSION: Patient is a 61 y.o. female who was seen today for physical therapy evaluation and treatment for partial total knee. Very pain limited today and extremely weak, also has significant gait impairment today as well. R quad muscle is not activating after surgery just yet. Practiced stairs with RW as it does not sound like they are doing this safely at home as is. Will benefit from skilled PT services to address all identified impairments and assist in return to optimal level of function.    OBJECTIVE IMPAIRMENTS Abnormal gait, decreased activity tolerance, decreased balance, decreased endurance, decreased knowledge of use of DME, decreased mobility, difficulty walking, decreased ROM, decreased strength, hypomobility, increased edema, increased fascial restrictions, impaired perceived functional ability, increased muscle spasms, impaired flexibility, impaired UE functional use, improper body mechanics, postural dysfunction, and pain.   ACTIVITY LIMITATIONS sitting, standing, squatting, stairs, transfers, bed mobility, and  locomotion level  PARTICIPATION LIMITATIONS: community activity, occupation, and yard work  PERSONAL FACTORS Behavior pattern, Past/current experiences, and Time since onset of injury/illness/exacerbation are also affecting patient's functional outcome.   REHAB POTENTIAL: Good  CLINICAL DECISION MAKING: Stable/uncomplicated  EVALUATION COMPLEXITY: Low   GOALS: Goals reviewed with patient? Yes  SHORT TERM GOALS: Target date: 07/09/2021  Will be compliant with appropriate HEP  Baseline: Goal status: INITIAL  2.  R knee flexion ROM to be at least 90 degrees  Baseline:  Goal status: INITIAL  3.  Will be able to navigate steps in the front of her house with [facility] and no more than stand by assist  Baseline:  Goal status: INITIAL  4.  Pain in R knee to be no more than 6/10  Baseline:  Goal status: INITIAL   LONG TERM GOALS: Target date: 08/06/2021   MMT in R LE to be at least 4/5  Baseline:  Goal status: INITIAL  2.  Pain in R knee to be no more than 3/10  Baseline:  Goal status: INITIAL  3.  Will be  able to reciprocally ascend and descend steps with no device and no increase in pain  Baseline:  Goal status: INITIAL  4.  Will be able to ambulate community distances with no device and R knee pain no more than 3/10 Baseline:  Goal status: INITIAL  5.  R knee extension ROM to be 0 degrees and flexion ROM to be at least 110 degrees  Baseline:  Goal status: INITIAL  6.  FOTO score to be at least 50 to show improvement in status  Baseline:  Goal status: INITIAL   PLAN: PT FREQUENCY:  2-3 times per week   PT DURATION: 8 weeks  PLANNED INTERVENTIONS: Therapeutic exercises, Therapeutic activity, Neuromuscular re-education, Balance training, Gait training, Patient/Family education, Joint mobilization, DME instructions, Dry Needling, Electrical stimulation, Cryotherapy, Moist heat, scar mobilization, Taping, Vasopneumatic device, Ultrasound, Ionotophoresis 4mg /ml  Dexamethasone, Manual therapy, and Re-evaluation  PLAN FOR NEXT SESSION: ROM, strength, gait training, pain and edema control, continue to practice steps with RW    Kristen U PT DPT PN2  06/11/2021, 10:14 AM

## 2021-06-14 ENCOUNTER — Ambulatory Visit: Payer: PRIVATE HEALTH INSURANCE

## 2021-06-14 DIAGNOSIS — M25561 Pain in right knee: Secondary | ICD-10-CM | POA: Diagnosis not present

## 2021-06-14 DIAGNOSIS — R600 Localized edema: Secondary | ICD-10-CM

## 2021-06-14 DIAGNOSIS — R262 Difficulty in walking, not elsewhere classified: Secondary | ICD-10-CM

## 2021-06-14 DIAGNOSIS — M6281 Muscle weakness (generalized): Secondary | ICD-10-CM

## 2021-06-14 DIAGNOSIS — M25661 Stiffness of right knee, not elsewhere classified: Secondary | ICD-10-CM

## 2021-06-15 ENCOUNTER — Ambulatory Visit: Payer: PRIVATE HEALTH INSURANCE | Admitting: Physical Therapy

## 2021-06-15 ENCOUNTER — Encounter: Payer: Self-pay | Admitting: Physical Therapy

## 2021-06-15 DIAGNOSIS — R262 Difficulty in walking, not elsewhere classified: Secondary | ICD-10-CM

## 2021-06-15 DIAGNOSIS — M25561 Pain in right knee: Secondary | ICD-10-CM

## 2021-06-15 DIAGNOSIS — M6281 Muscle weakness (generalized): Secondary | ICD-10-CM

## 2021-06-15 DIAGNOSIS — R600 Localized edema: Secondary | ICD-10-CM

## 2021-06-15 DIAGNOSIS — M25661 Stiffness of right knee, not elsewhere classified: Secondary | ICD-10-CM

## 2021-06-15 NOTE — Therapy (Signed)
OUTPATIENT PHYSICAL THERAPY LOWER EXTREMITY EVALUATION   Patient Name: Melinda Benton MRN: [id number] DOB:12/19/1960, 61 y.o., female Today's Date: 06/15/2021   PT End of Session - 06/15/21 1716     Visit Number 3    Number of Visits 25    Date for PT Re-Evaluation 08/05/21    PT Start Time 1610    PT Stop Time 1710    PT Time Calculation (min) 60 min    Activity Tolerance Patient tolerated treatment well    Behavior During Therapy [facility] for tasks assessed/performed              Past Medical History:  Diagnosis Date   Arthritis    Clotting disorder (HCC)    Cough 05/19/2014   H/O blood clots    History of chicken pox    Migraines    Mitral valve prolapse    Thyroid disease    Past Surgical History:  Procedure Laterality Date   bladder sur     CESAREAN SECTION     2   CHOLECYSTECTOMY N/A 07/07/2019   Procedure: LAPAROSCOPIC CHOLECYSTECTOMY WITH INTRAOPERATIVE CHOLANGIOGRAM;  Surgeon: Griselda Miner, MD;  Location: [facility];  Service: General;  Laterality: N/A;   ELBOW SURGERY  2009   knee repla Left 07/2016   partial knee replacement    ROTATOR CUFF REPAIR  2011   TONSILLECTOMY  ?1968   Patient Active Problem List   Diagnosis Date Noted   Age-related osteoporosis without current pathological fracture 05/07/2021   Protein S deficiency (HCC) 11/24/2020   Family history of diabetes mellitus 04/21/2020   Hyperlipidemia 04/21/2020   Nontoxic single thyroid nodule 04/21/2020   Weight gain 04/21/2020   Closed nondisplaced fracture of proximal phalanx of lesser toe of right foot 02/18/2020   Closed nondisplaced fracture of proximal phalanx of lesser toe of left foot 12/02/2019   S/P laparoscopic cholecystectomy 07/11/2019   Family history of colon cancer 11/05/2018   Hypothyroidism 11/05/2018   Chronic left shoulder pain 03/06/2018   GAD (generalized anxiety disorder) 03/20/2017   S/P left unicompartmental knee replacement 07/19/2016   Chronic pain of right knee  06/07/2016   Spider varicose veins 09/01/2015   Palpitations 10/29/2013   Arthritis 10/09/2013   Anticoagulant long-term use 08/04/2010   Pulmonary embolus (HCC) 02/15/2010   Hypercoagulable state (HCC) 02/15/2010    PCP: Anne Ng   REFERRING PROVIDER: Dannielle Huh, MD   REFERRING DIAG: s/p R Medial Partial Knee-5/31 Post-op PT Call patient to schedule. Surg is on 5/31, start PT=6/8 pm or 06/11/21.   THERAPY DIAG:  Acute pain of right knee  Localized edema  Muscle weakness (generalized)  Stiffness of right knee, not elsewhere classified  Difficulty in walking, not elsewhere classified  Rationale for Evaluation and Treatment Rehabilitation  ONSET DATE: 05/13/2021   SUBJECTIVE:   SUBJECTIVE STATEMENT: Reports still hurting frustrating not bening able to walk or bend the knee.   PERTINENT HISTORY: Hx PE, OA, hx phalanx fractures B feet, hypercoaguloable state, L shoulder pain, TKR   PAIN:  Are you having pain? Yes: NPRS scale: 8/10 Pain location: R knee  Pain description: post-op pain  Aggravating factors: bearing weight Relieving factors: ice   PRECAUTIONS: None WBAT   WEIGHT BEARING RESTRICTIONS No  FALLS:  Has patient fallen in last 6 months? 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)  PLOF: Independent, Independent with basic ADLs, Independent with gait, and Independent with transfers  PATIENT GOALS get back to 100%, get better ROM    OBJECTIVE:     PATIENT SURVEYS:  FOTO 11.9  COGNITION:  Overall cognitive status: Within functional limits for tasks assessed        LOWER EXTREMITY ROM:  Active ROM Right eval Left eval Right   Hip flexion     Hip  extension     Hip abduction     Hip adduction     Hip internal rotation     Hip external rotation     Knee flexion 42  62  Knee extension 5    Ankle dorsiflexion     Ankle plantarflexion     Ankle inversion     Ankle eversion      (Blank rows = not tested)  LOWER EXTREMITY MMT:  MMT Right eval Left eval  Hip flexion 2 4+  Hip extension    Hip abduction    Hip adduction    Hip internal rotation    Hip external rotation    Knee flexion 2+ 4+  Knee extension 2 4+  Ankle dorsiflexion 3- 5  Ankle plantarflexion    Ankle inversion    Ankle eversion     (Blank rows = not tested)   GAIT: Distance walked: in clinic distances Assistive device utilized: Environmental consultantWalker - 2 wheeled Level of assistance: Modified independence Comments: antalgic, very limited knee flexion, limited heel toe pattern and step to pattern     TODAY'S TREATMENT:  06/15/21: Gait with patient using the walker and trying to get her to bend the right knee, worked on her walking with a SPC and hand hold assist Manual Therapy, gentle scar and STM around the patella and thigh while moving the knee into flexion and extension, verbal and tactile cues to relax the hip and allow the motions Red tband HS curls, foot slides and assisted foot slides, SAQ with assist and then without, some LAQ with red tband assist, stnading marches and HS curls and weight shifts Vaso medium pressure 35 degrees right knee  06/14/21  Stair training with RW and MinA for safety  Knee flexion AAROM with sheet 2x10 RLE Long sit hip ABD 2x10 R LE   SLR unable to do   [employer]ivQuad sets with towel 2x10 R LE   Standing abd and ext x10  Calf raises x10    Vaso 10 mins, high pressure, 8F   PATIENT EDUCATION:  Education details: POC, HEP, how to do self massage to decrease edema, to try to do exercises to activate muscles rather than using CPM  Person educated: Patient Education method: Explanation, Demonstration, and Handouts Education  comprehension: verbalized understanding, returned demonstration, and needs further education   HOME EXERCISE PROGRAM: [id number]  ASSESSMENT:  CLINICAL IMPRESSION: Patient continues to c/o pain and difficulty walking and moving the knee, she is very limited with ROM, I did some very gentle motions and light distraction and was able to get her to bend to 62 degrees, I also used tband to help with extension and she was able to do a SAQ prior to leaving today, we worked on gait with less stopping and a continues path with equal step length.  She was able to do pretty good prior to leaving with HHA.  OBJECTIVE IMPAIRMENTS Abnormal gait, decreased activity tolerance, decreased balance, decreased endurance, decreased knowledge of use of DME, decreased mobility, difficulty walking, decreased ROM, decreased strength, hypomobility, increased edema, increased fascial restrictions, impaired perceived functional ability, increased muscle spasms, impaired flexibility, impaired UE functional use, improper body mechanics, postural dysfunction, and pain.   ACTIVITY LIMITATIONS sitting, standing, squatting, stairs, transfers, bed mobility, and locomotion level  PARTICIPATION LIMITATIONS: community activity, occupation, and yard work  PERSONAL FACTORS Behavior pattern, Past/current experiences, and Time since onset of injury/illness/exacerbation are also affecting patient's functional outcome.   REHAB POTENTIAL: Good  CLINICAL DECISION MAKING: Stable/uncomplicated  EVALUATION COMPLEXITY: Low   GOALS: Goals reviewed with patient? Yes  SHORT TERM GOALS: Target date: 07/13/21 Will be compliant with appropriate HEP  Baseline: Goal status: INITIAL  2.  R knee flexion ROM to be at least 90 degrees  Baseline:  Goal status: INITIAL  3.  Will be able to navigate steps in the front of her house with [facility] and no more than stand by assist  Baseline:  Goal status: INITIAL  4.  Pain in R knee to be no more  than 6/10  Baseline:  Goal status: INITIAL   LONG TERM GOALS: Target date: 08/10/21  MMT in R LE to be at least 4/5  Baseline:  Goal status: INITIAL  2.  Pain in R knee to be no more than 3/10  Baseline:  Goal status: INITIAL  3.  Will be able to reciprocally ascend and descend steps with no device and no increase in pain  Baseline:  Goal status: INITIAL  4.  Will be able to ambulate community distances with no device and R knee pain no more than 3/10 Baseline:  Goal status: INITIAL  5.  R knee extension ROM to be 0 degrees and flexion ROM to be at least 110 degrees  Baseline:  Goal status: INITIAL  6.  FOTO score to be at least 50 to show improvement in status  Baseline:  Goal status: INITIAL   PLAN: PT FREQUENCY:  2-3 times per week   PT DURATION: 8 weeks  PLANNED INTERVENTIONS: Therapeutic exercises, Therapeutic activity, Neuromuscular re-education, Balance training, Gait training, Patient/Family education, Joint mobilization, DME instructions, Dry Needling, Electrical stimulation, Cryotherapy, Moist heat, scar mobilization, Taping, Vasopneumatic device, Ultrasound, Ionotophoresis 4mg /ml Dexamethasone, Manual therapy, and Re-evaluation  PLAN FOR NEXT SESSION: ROM, strength, gait training, pain and edema control, try E-stim for quad activation

## 2021-06-16 NOTE — Telephone Encounter (Signed)
Sent email to [employer] today. Received a VM back reporting the order is being processed but they need verbal to verify the spelling of her first name because they have another pt in their system with a similar name. She reports a secure VM could be left, they also need to confirm her phone number.   LVM with name confirmation and spelling and phone number.

## 2021-06-17 ENCOUNTER — Ambulatory Visit: Payer: PRIVATE HEALTH INSURANCE | Admitting: Physical Therapy

## 2021-06-17 DIAGNOSIS — M25561 Pain in right knee: Secondary | ICD-10-CM

## 2021-06-17 DIAGNOSIS — M25661 Stiffness of right knee, not elsewhere classified: Secondary | ICD-10-CM

## 2021-06-17 DIAGNOSIS — R262 Difficulty in walking, not elsewhere classified: Secondary | ICD-10-CM

## 2021-06-17 DIAGNOSIS — R600 Localized edema: Secondary | ICD-10-CM

## 2021-06-17 DIAGNOSIS — M6281 Muscle weakness (generalized): Secondary | ICD-10-CM

## 2021-06-17 NOTE — Therapy (Signed)
OUTPATIENT PHYSICAL THERAPY LOWER EXTREMITY   Patient Name: MYIA BERGH MRN: [id number] DOB:12-17-1960, 61 y.o., female Today's Date: 06/17/2021   PT End of Session - 06/17/21 1654     Visit Number 4    Number of Visits 25    Date for PT Re-Evaluation 08/05/21    Authorization Type [employer]    Authorization Time Period 06/10/21 to 08/05/21    PT Start Time 1655    PT Stop Time 1750    PT Time Calculation (min) 55 min              Past Medical History:  Diagnosis Date   Arthritis    Clotting disorder (HCC)    Cough 05/19/2014   H/O blood clots    History of chicken pox    Migraines    Mitral valve prolapse    Thyroid disease    Past Surgical History:  Procedure Laterality Date   bladder sur     CESAREAN SECTION     2   CHOLECYSTECTOMY N/A 07/07/2019   Procedure: LAPAROSCOPIC CHOLECYSTECTOMY WITH INTRAOPERATIVE CHOLANGIOGRAM;  Surgeon: Griselda Miner, MD;  Location: [facility];  Service: General;  Laterality: N/A;   ELBOW SURGERY  2009   knee repla Left 07/2016   partial knee replacement    ROTATOR CUFF REPAIR  2011   TONSILLECTOMY  ?1968   Patient Active Problem List   Diagnosis Date Noted   Age-related osteoporosis without current pathological fracture 05/07/2021   Protein S deficiency (HCC) 11/24/2020   Family history of diabetes mellitus 04/21/2020   Hyperlipidemia 04/21/2020   Nontoxic single thyroid nodule 04/21/2020   Weight gain 04/21/2020   Closed nondisplaced fracture of proximal phalanx of lesser toe of right foot 02/18/2020   Closed nondisplaced fracture of proximal phalanx of lesser toe of left foot 12/02/2019   S/P laparoscopic cholecystectomy 07/11/2019   Family history of colon cancer 11/05/2018   Hypothyroidism 11/05/2018   Chronic left shoulder pain 03/06/2018   GAD (generalized anxiety disorder) 03/20/2017   S/P left unicompartmental knee replacement 07/19/2016   Chronic pain of right knee 06/07/2016   Spider varicose veins 09/01/2015    Palpitations 10/29/2013   Arthritis 10/09/2013   Anticoagulant long-term use 08/04/2010   Pulmonary embolus (HCC) 02/15/2010   Hypercoagulable state (HCC) 02/15/2010    PCP: Anne Ng   REFERRING PROVIDER: Dannielle Huh, MD   REFERRING DIAG: s/p R Medial Partial Knee-5/31 Post-op PT Call patient to schedule. Surg is on 5/31, start PT=6/8 pm or 06/11/21.   THERAPY DIAG:  Acute pain of right knee  Localized edema  Muscle weakness (generalized)  Stiffness of right knee, not elsewhere classified  Difficulty in walking, not elsewhere classified  Rationale for Evaluation and Treatment Rehabilitation  ONSET DATE: 05/13/2021   SUBJECTIVE:   SUBJECTIVE STATEMENT: felt so good after last session but stiffened up   PERTINENT HISTORY: Hx PE, OA, hx phalanx fractures B feet, hypercoaguloable state, L shoulder pain, TKR   PAIN:  Are you having pain? Yes: NPRS scale: 8/10 Pain location: R knee  Pain description: post-op pain  Aggravating factors: bearing weight Relieving factors: ice   PRECAUTIONS: None WBAT   WEIGHT BEARING RESTRICTIONS No  FALLS:  Has patient fallen in last 6 months? 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)  PLOF: Independent, Independent with basic ADLs, Independent with gait, and Independent with transfers  PATIENT GOALS get back to 100%, get better ROM    OBJECTIVE:     PATIENT SURVEYS:  FOTO 11.9  COGNITION:  Overall cognitive status: Within functional limits for tasks assessed        LOWER EXTREMITY ROM:  Active ROM Right eval Left eval Right   Hip flexion     Hip extension     Hip abduction     Hip adduction     Hip internal rotation     Hip  external rotation     Knee flexion 42  62  Knee extension 5    Ankle dorsiflexion     Ankle plantarflexion     Ankle inversion     Ankle eversion      (Blank rows = not tested)  LOWER EXTREMITY MMT:  MMT Right eval Left eval  Hip flexion 2 4+  Hip extension    Hip abduction    Hip adduction    Hip internal rotation    Hip external rotation    Knee flexion 2+ 4+  Knee extension 2 4+  Ankle dorsiflexion 3- 5  Ankle plantarflexion    Ankle inversion    Ankle eversion     (Blank rows = not tested)   GAIT: Distance walked: in clinic distances Assistive device utilized: Environmental consultant - 2 wheeled Level of assistance: Modified independence Comments: antalgic, very limited knee flexion, limited heel toe pattern and step to pattern     TODAY'S TREATMENT:  06/27/21  Nustep L 3 4 min with PTA overpressure Step stretch with PTA assist to decrease hip hike Step tap with PTA assist PROM into flex with joint mobs STW to quad HS curl yellow tband 2 sets 10 AA LAQ with eccentric lowering 3 sets 5, by end could active and lift leg but quad lag' Worked on gait with RW and HHA to increase knee flexion in swing phase VASO 10 min after  06/15/21: Gait with patient using the walker and trying to get her to bend the right knee, worked on her walking with a SPC and hand hold assist Manual Therapy, gentle scar and STM around the patella and thigh while moving the knee into flexion and extension, verbal and tactile cues to relax the hip and allow the motions Red tband HS curls, foot slides and assisted foot slides, SAQ with assist and then without, some LAQ with red tband assist, stnading marches and HS curls and weight shifts Vaso medium pressure 35 degrees right knee  06/14/21  Stair training with RW and MinA for safety  Knee flexion AAROM with sheet 2x10 RLE Long sit hip ABD 2x10 R LE   SLR unable to do   [employer] with towel 2x10 R LE   Standing abd and ext x10  Calf raises x10     Vaso 10 mins, high pressure, 10F   PATIENT EDUCATION:  Education details: POC, HEP, how to do self massage to decrease edema, to try to do exercises to activate muscles rather than using CPM  Person educated: Patient Education method: Explanation, Demonstration, and Handouts Education comprehension: verbalized understanding, returned demonstration, and needs further education   HOME EXERCISE PROGRAM: [id number]  ASSESSMENT:  CLINICAL IMPRESSION: Progress there ex to increase ROM with PTA assist to prevent hike hiking and other compensations. AROM after ex and  stretching 72-80. Pt very tight and guards with flexion OBJECTIVE IMPAIRMENTS Abnormal gait, decreased activity tolerance, decreased balance, decreased endurance, decreased knowledge of use of DME, decreased mobility, difficulty walking, decreased ROM, decreased strength, hypomobility, increased edema, increased fascial restrictions, impaired perceived functional ability, increased muscle spasms, impaired flexibility, impaired UE functional use, improper body mechanics, postural dysfunction, and pain.   ACTIVITY LIMITATIONS sitting, standing, squatting, stairs, transfers, bed mobility, and locomotion level  PARTICIPATION LIMITATIONS: community activity, occupation, and yard work  PERSONAL FACTORS Behavior pattern, Past/current experiences, and Time since onset of injury/illness/exacerbation are also affecting patient's functional outcome.   REHAB POTENTIAL: Good  CLINICAL DECISION MAKING: Stable/uncomplicated  EVALUATION COMPLEXITY: Low   GOALS: Goals reviewed with patient? Yes  SHORT TERM GOALS: Target date: 07/13/21 Will be compliant with appropriate HEP  Baseline: Goal status: INITIAL  2.  R knee flexion ROM to be at least 90 degrees  Baseline:  Goal status: INITIAL  3.  Will be able to navigate steps in the front of her house with [facility] and no more than stand by assist  Baseline:  Goal status: INITIAL  4.  Pain  in R knee to be no more than 6/10  Baseline:  Goal status: INITIAL   LONG TERM GOALS: Target date: 08/10/21  MMT in R LE to be at least 4/5  Baseline:  Goal status: INITIAL  2.  Pain in R knee to be no more than 3/10  Baseline:  Goal status: INITIAL  3.  Will be able to reciprocally ascend and descend steps with no device and no increase in pain  Baseline:  Goal status: INITIAL  4.  Will be able to ambulate community distances with no device and R knee pain no more than 3/10 Baseline:  Goal status: INITIAL  5.  R knee extension ROM to be 0 degrees and flexion ROM to be at least 110 degrees  Baseline:  Goal status: INITIAL  6.  FOTO score to be at least 50 to show improvement in status  Baseline:  Goal status: INITIAL   PLAN: PT FREQUENCY:  2-3 times per week   PT DURATION: 8 weeks  PLANNED INTERVENTIONS: Therapeutic exercises, Therapeutic activity, Neuromuscular re-education, Balance training, Gait training, Patient/Family education, Joint mobilization, DME instructions, Dry Needling, Electrical stimulation, Cryotherapy, Moist heat, scar mobilization, Taping, Vasopneumatic device, Ultrasound, Ionotophoresis 4mg /ml Dexamethasone, Manual therapy, and Re-evaluation  PLAN FOR NEXT SESSION: ROM, strength, gait training, pain and edema control, try E-stim for quad activation   Angie Kaidon Kinker PTA

## 2021-06-21 ENCOUNTER — Other Ambulatory Visit: Payer: Self-pay | Admitting: Endocrinology

## 2021-06-21 DIAGNOSIS — E041 Nontoxic single thyroid nodule: Secondary | ICD-10-CM

## 2021-06-22 ENCOUNTER — Encounter: Payer: Self-pay | Admitting: Physical Therapy

## 2021-06-22 ENCOUNTER — Ambulatory Visit: Payer: PRIVATE HEALTH INSURANCE | Admitting: Physical Therapy

## 2021-06-22 ENCOUNTER — Ambulatory Visit (INDEPENDENT_AMBULATORY_CARE_PROVIDER_SITE_OTHER): Payer: PRIVATE HEALTH INSURANCE

## 2021-06-22 DIAGNOSIS — R262 Difficulty in walking, not elsewhere classified: Secondary | ICD-10-CM

## 2021-06-22 DIAGNOSIS — M25661 Stiffness of right knee, not elsewhere classified: Secondary | ICD-10-CM

## 2021-06-22 DIAGNOSIS — R600 Localized edema: Secondary | ICD-10-CM

## 2021-06-22 DIAGNOSIS — Z7901 Long term (current) use of anticoagulants: Secondary | ICD-10-CM | POA: Diagnosis not present

## 2021-06-22 DIAGNOSIS — M25561 Pain in right knee: Secondary | ICD-10-CM

## 2021-06-22 DIAGNOSIS — M6281 Muscle weakness (generalized): Secondary | ICD-10-CM

## 2021-06-22 LAB — POCT INR: INR: 3.4 — AB (ref 2.0–3.0)

## 2021-06-22 NOTE — Patient Instructions (Addendum)
Pre visit review using our clinic review tool, if applicable. No additional management support is needed unless otherwise documented below in the visit note.  Hold dose tomorrow and change weekly dose to take 6mg  daily except take 3mg  on Monday, Wednesdays and Saturdays. Recheck on 4 weeks.

## 2021-06-22 NOTE — Progress Notes (Signed)
Pt had recent knee surgery and recovering well but is still having some pain.  Pt reports also that she has not been eating as many greens as she normally does. She will eat a salad today.  Hold dose tomorrow and change weekly dose to take 6mg  daily except take 3mg  on Monday, Wednesdays and Saturdays. Recheck on 4 weeks.

## 2021-06-22 NOTE — Therapy (Signed)
OUTPATIENT PHYSICAL THERAPY LOWER EXTREMITY   Patient Name: Melinda Benton MRN: [id number] DOB:02-29-1960, 61 y.o., female Today's Date: 06/22/2021   PT End of Session - 06/22/21 1620     Visit Number 5    Number of Visits 25    Date for PT Re-Evaluation 08/05/21    Authorization Type [employer]    Authorization Time Period 06/10/21 to 08/05/21    PT Start Time 1610    PT Stop Time 1710    PT Time Calculation (min) 60 min    Activity Tolerance Patient tolerated treatment well    Behavior During Therapy [facility] for tasks assessed/performed              Past Medical History:  Diagnosis Date   Arthritis    Clotting disorder ([place])    Cough 05/19/2014   H/O blood clots    History of chicken pox    Migraines    Mitral valve prolapse    Thyroid disease    Past Surgical History:  Procedure Laterality Date   bladder sur     CESAREAN SECTION     2   CHOLECYSTECTOMY N/A 07/07/2019   Procedure: LAPAROSCOPIC CHOLECYSTECTOMY WITH INTRAOPERATIVE CHOLANGIOGRAM;  Surgeon: Jovita Kussmaul, MD;  Location: [facility];  Service: General;  Laterality: N/A;   ELBOW SURGERY  2009   knee repla Left 07/2016   partial knee replacement    ROTATOR CUFF REPAIR  2011   TONSILLECTOMY  ?1968   Patient Active Problem List   Diagnosis Date Noted   Age-related osteoporosis without current pathological fracture 05/07/2021   Protein S deficiency ([place]) 11/24/2020   Family history of diabetes mellitus 04/21/2020   Hyperlipidemia 04/21/2020   Nontoxic single thyroid nodule 04/21/2020   Weight gain 04/21/2020   Closed nondisplaced fracture of proximal phalanx of lesser toe of right foot 02/18/2020   Closed nondisplaced fracture of proximal phalanx of lesser toe of left foot 12/02/2019   S/P laparoscopic cholecystectomy 07/11/2019   Family history of colon cancer 11/05/2018   Hypothyroidism 11/05/2018   Chronic left shoulder pain 03/06/2018   GAD (generalized anxiety disorder) 03/20/2017   S/P left  unicompartmental knee replacement 07/19/2016   Chronic pain of right knee 06/07/2016   Spider varicose veins 09/01/2015   Palpitations 10/29/2013   Arthritis 10/09/2013   Anticoagulant long-term use 08/04/2010   Pulmonary embolus ([place]) 02/15/2010   Hypercoagulable state ([place]) 02/15/2010    PCP: [place], [place] PROVIDER: Vickey Huger, MD   REFERRING DIAG: s/p R Medial Partial Knee-5/31 Post-op PT Call patient to schedule. Surg is on 5/31, start PT=6/8 pm or 06/11/21.   THERAPY DIAG:  Acute pain of right knee  Localized edema  Muscle weakness (generalized)  Stiffness of right knee, not elsewhere classified  Difficulty in walking, not elsewhere classified  Rationale for Evaluation and Treatment Rehabilitation  ONSET DATE: 05/13/2021   SUBJECTIVE:   SUBJECTIVE STATEMENT:  Patient comes in with a SPC, still very stiff    PERTINENT HISTORY: Hx PE, OA, hx phalanx fractures B feet, hypercoaguloable state, L shoulder pain, TKR   PAIN:  Are you having pain? Yes: NPRS scale: 8/10 Pain location: R knee  Pain description: post-op pain  Aggravating factors: bearing weight Relieving factors: ice   PRECAUTIONS: None WBAT   WEIGHT BEARING RESTRICTIONS No  FALLS:  Has patient fallen in last 6 months? 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)  PLOF: Independent, Independent with basic ADLs, Independent with gait, and Independent with transfers  PATIENT GOALS get back to 100%, get better ROM    OBJECTIVE:     PATIENT SURVEYS:  FOTO 11.9  COGNITION:  Overall cognitive status: Within functional limits for tasks assessed        LOWER EXTREMITY ROM:  Active ROM Right eval  Left eval Right  06/15/21 Right PROM 06/22/21  Hip flexion      Hip extension      Hip abduction      Hip adduction      Hip internal rotation      Hip external rotation      Knee flexion 42  62 85  Knee extension 5     Ankle dorsiflexion      Ankle plantarflexion      Ankle inversion      Ankle eversion       (Blank rows = not tested)  LOWER EXTREMITY MMT:  MMT Right eval Left eval  Hip flexion 2 4+  Hip extension    Hip abduction    Hip adduction    Hip internal rotation    Hip external rotation    Knee flexion 2+ 4+  Knee extension 2 4+  Ankle dorsiflexion 3- 5  Ankle plantarflexion    Ankle inversion    Ankle eversion     (Blank rows = not tested)   GAIT: Distance walked: in clinic distances Assistive device utilized: SPC Level of assistance: Modified independence Comments:cues to bend the knee at toe off and be a little more natural    TODAY'S TREATMENT: 06/23/22: Nustep Level 5 x 6 minutes, practiced walking as noted above,  PROM, STM and joint distraction to gain ROM and decrease pain Knee flexion 20# both legs 2x10. Leg press 20# both legs 2x10, then no weight both legs working on flexion ROM, then right only small ROM no weight 2x5 LAQ a lot of cues to get the motions and get TKE without a lot of hip flexor Seated yellow tband HS curl SAQ 2x10 in supine Vaso Medium pressure right knee x 10 minutes     06/17/21  Nustep L 3 4 min with PTA overpressure Step stretch with PTA assist to decrease hip hike Step tap with PTA assist PROM into flex with joint mobs STW to quad HS curl yellow tband 2 sets 10 AA LAQ with eccentric lowering 3 sets 5, by end could active and lift leg but quad lag' Worked on gait with RW and HHA to increase knee flexion in swing phase VASO 10 min after  06/15/21: Gait with patient using the walker and trying to get her to bend the right knee, worked on her walking with a SPC and hand hold assist Manual Therapy, gentle scar  and STM around the patella and thigh while moving the knee into flexion and extension, verbal and tactile cues to relax the hip and allow the motions Red tband HS curls, foot slides and assisted foot slides, SAQ with assist and then without, some LAQ with red tband assist, stnading marches and HS curls and weight shifts Vaso medium pressure 35 degrees right knee  06/14/21  Stair training with RW and MinA for safety  Knee flexion AAROM with sheet  2x10 RLE Long sit hip ABD 2x10 R LE   SLR unable to do   Quad sets with towel 2x10 R LE   Standing abd and ext x10  Calf raises x10    Vaso 10 mins, high pressure, 40F   PATIENT EDUCATION:  Education details: POC, HEP, how to do self massage to decrease edema, to try to do exercises to activate muscles rather than using CPM  Person educated: Patient Education method: Explanation, Demonstration, and Handouts Education comprehension: verbalized understanding, returned demonstration, and needs further education   HOME EXERCISE PROGRAM: [id number]  ASSESSMENT:  CLINICAL IMPRESSION: Patient gets very stiff, she is improving now walking with SPC only, does need cues to bend the knee and walk more naturally.  C/O pulling at the top of the scar and that it feels like a stitch pulling apart.  Needs a lot of cues and encouragement to get the knee to bend, she limits due to pain    OBJECTIVE IMPAIRMENTS Abnormal gait, decreased activity tolerance, decreased balance, decreased endurance, decreased knowledge of use of DME, decreased mobility, difficulty walking, decreased ROM, decreased strength, hypomobility, increased edema, increased fascial restrictions, impaired perceived functional ability, increased muscle spasms, impaired flexibility, impaired UE functional use, improper body mechanics, postural dysfunction, and pain.   ACTIVITY LIMITATIONS sitting, standing, squatting, stairs, transfers, bed mobility, and locomotion level  PARTICIPATION  LIMITATIONS: community activity, occupation, and yard work  PERSONAL FACTORS Behavior pattern, Past/current experiences, and Time since onset of injury/illness/exacerbation are also affecting patient's functional outcome.   REHAB POTENTIAL: Good  CLINICAL DECISION MAKING: Stable/uncomplicated  EVALUATION COMPLEXITY: Low   GOALS: Goals reviewed with patient? Yes  SHORT TERM GOALS: Target date: 07/13/21 Will be compliant with appropriate HEP  Baseline: Goal status: partially met  2.  R knee flexion ROM to be at least 90 degrees  Baseline:  Goal status: passively partially met  3.  Will be able to navigate steps in the front of her house with [facility] and no more than stand by assist  Baseline:  Goal status: on going  4.  Pain in R knee to be no more than 6/10  Baseline:  Goal status: INITIAL   LONG TERM GOALS: Target date: 08/10/21  MMT in R LE to be at least 4/5  Baseline:  Goal status: INITIAL  2.  Pain in R knee to be no more than 3/10  Baseline:  Goal status: INITIAL  3.  Will be able to reciprocally ascend and descend steps with no device and no increase in pain  Baseline:  Goal status: INITIAL  4.  Will be able to ambulate community distances with no device and R knee pain no more than 3/10 Baseline:  Goal status: INITIAL  5.  R knee extension ROM to be 0 degrees and flexion ROM to be at least 110 degrees  Baseline:  Goal status: INITIAL  6.  FOTO score to be at least 50 to show improvement in status  Baseline:  Goal status: INITIAL   PLAN: PT FREQUENCY:  2-3 times per week   PT DURATION: 8 weeks  PLANNED INTERVENTIONS: Therapeutic exercises, Therapeutic activity, Neuromuscular re-education, Balance training, Gait training, Patient/Family education, Joint mobilization, DME instructions, Dry Needling, Electrical stimulation, Cryotherapy, Moist heat, scar mobilization, Taping, Vasopneumatic device, Ultrasound, Ionotophoresis 34m/ml Dexamethasone, Manual  therapy, and Re-evaluation  PLAN FOR NEXT SESSION: really try to get the flexion back and her moving more naturally

## 2021-06-24 ENCOUNTER — Ambulatory Visit: Payer: PRIVATE HEALTH INSURANCE | Admitting: Physical Therapy

## 2021-06-24 DIAGNOSIS — M25561 Pain in right knee: Secondary | ICD-10-CM | POA: Diagnosis not present

## 2021-06-24 DIAGNOSIS — M6281 Muscle weakness (generalized): Secondary | ICD-10-CM

## 2021-06-24 DIAGNOSIS — R262 Difficulty in walking, not elsewhere classified: Secondary | ICD-10-CM

## 2021-06-24 DIAGNOSIS — R600 Localized edema: Secondary | ICD-10-CM

## 2021-06-24 DIAGNOSIS — M25661 Stiffness of right knee, not elsewhere classified: Secondary | ICD-10-CM

## 2021-06-24 NOTE — Therapy (Signed)
OUTPATIENT PHYSICAL THERAPY LOWER EXTREMITY   Patient Name: Melinda Benton MRN: [id number] DOB:1960-03-22, 61 y.o., female Today's Date: 06/24/2021   PT End of Session - 06/24/21 0759     Visit Number 6    Number of Visits 25    Date for PT Re-Evaluation 08/05/21    Authorization Type [employer]    Authorization Time Period 06/10/21 to 08/05/21    PT Start Time 0800    PT Stop Time 0900    PT Time Calculation (min) 60 min              Past Medical History:  Diagnosis Date   Arthritis    Clotting disorder ([place])    Cough 05/19/2014   H/O blood clots    History of chicken pox    Migraines    Mitral valve prolapse    Thyroid disease    Past Surgical History:  Procedure Laterality Date   bladder sur     CESAREAN SECTION     2   CHOLECYSTECTOMY N/A 07/07/2019   Procedure: LAPAROSCOPIC CHOLECYSTECTOMY WITH INTRAOPERATIVE CHOLANGIOGRAM;  Surgeon: Jovita Kussmaul, MD;  Location: [facility];  Service: General;  Laterality: N/A;   ELBOW SURGERY  2009   knee repla Left 07/2016   partial knee replacement    ROTATOR CUFF REPAIR  2011   TONSILLECTOMY  ?1968   Patient Active Problem List   Diagnosis Date Noted   Age-related osteoporosis without current pathological fracture 05/07/2021   Protein S deficiency ([place]) 11/24/2020   Family history of diabetes mellitus 04/21/2020   Hyperlipidemia 04/21/2020   Nontoxic single thyroid nodule 04/21/2020   Weight gain 04/21/2020   Closed nondisplaced fracture of proximal phalanx of lesser toe of right foot 02/18/2020   Closed nondisplaced fracture of proximal phalanx of lesser toe of left foot 12/02/2019   S/P laparoscopic cholecystectomy 07/11/2019   Family history of colon cancer 11/05/2018   Hypothyroidism 11/05/2018   Chronic left shoulder pain 03/06/2018   GAD (generalized anxiety disorder) 03/20/2017   S/P left unicompartmental knee replacement 07/19/2016   Chronic pain of right knee 06/07/2016   Spider varicose veins 09/01/2015    Palpitations 10/29/2013   Arthritis 10/09/2013   Anticoagulant long-term use 08/04/2010   Pulmonary embolus ([place]) 02/15/2010   Hypercoagulable state ([place]) 02/15/2010    PCP: [place], [place] PROVIDER: Vickey Huger, MD   REFERRING DIAG: s/p R Medial Partial Knee-5/31 Post-op PT Call patient to schedule. Surg is on 5/31, start PT=6/8 pm or 06/11/21.   THERAPY DIAG:  Acute pain of right knee  Localized edema  Muscle weakness (generalized)  Stiffness of right knee, not elsewhere classified  Difficulty in walking, not elsewhere classified  Rationale for Evaluation and Treatment Rehabilitation  ONSET DATE: 05/13/2021   SUBJECTIVE:   SUBJECTIVE STATEMENT:  Patient comes in with a SPC, still very stiff    PERTINENT HISTORY: Hx PE, OA, hx phalanx fractures B feet, hypercoaguloable state, L shoulder pain, TKR   PAIN:  Are you having pain? Yes: NPRS scale: 6/10 Pain location: R knee  Pain description: post-op pain  Aggravating factors: bearing weight Relieving factors: ice   PRECAUTIONS: None WBAT   WEIGHT BEARING RESTRICTIONS No  FALLS:  Has patient fallen in last 6 months? 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)  PLOF: Independent, Independent with basic ADLs, Independent with gait, and Independent with transfers  PATIENT GOALS get back to 100%, get better ROM    OBJECTIVE:     PATIENT SURVEYS:  FOTO 11.9  COGNITION:  Overall cognitive status: Within functional limits for tasks assessed        LOWER EXTREMITY ROM:  Active ROM Right eval Left eval Right  06/15/21 Right PROM 06/22/21  Hip flexion      Hip extension      Hip abduction      Hip adduction       Hip internal rotation      Hip external rotation      Knee flexion 42  62 85  Knee extension 5     Ankle dorsiflexion      Ankle plantarflexion      Ankle inversion      Ankle eversion       (Blank rows = not tested)  LOWER EXTREMITY MMT:  MMT Right eval Left eval  Hip flexion 2 4+  Hip extension    Hip abduction    Hip adduction    Hip internal rotation    Hip external rotation    Knee flexion 2+ 4+  Knee extension 2 4+  Ankle dorsiflexion 3- 5  Ankle plantarflexion    Ankle inversion    Ankle eversion     (Blank rows = not tested)   GAIT: Distance walked: in clinic distances Assistive device utilized: SPC Level of assistance: Modified independence Comments:cues to bend the knee at toe off and be a little more natural    TODAY'S TREATMENT:  06/24/21 Nustep L 5 6 min Resisted Gait 30# 5 x fwd/back, 3 each side 6 in box step tap 10 4 in step up 10 x with UE assist Leg press 40 # 2 sets 10 Red tband HS curl 2 sets 10 LAQ 2# 3 sets 5 PROM flex/ext and jt capsule stretch      06/23/22: Nustep Level 5 x 6 minutes, practiced walking as noted above,  PROM, STM and joint distraction to gain ROM and decrease pain Knee flexion 20# both legs 2x10. Leg press 20# both legs 2x10, then no weight both legs working on flexion ROM, then right only small ROM no weight 2x5 LAQ a lot of cues to get the motions and get TKE without a lot of hip flexor Seated yellow tband HS curl SAQ 2x10 in supine Vaso Medium pressure right knee x 10 minutes VASO     06/17/21  Nustep L 3 4 min with PTA overpressure Step stretch with PTA assist to decrease hip hike Step tap with PTA assist PROM into flex with joint mobs STW to quad HS curl yellow tband 2 sets 10 AA LAQ with eccentric lowering 3 sets 5, by end could active and lift leg but quad lag' Worked on gait with RW and HHA to increase knee flexion in swing phase VASO 10 min after  06/15/21: Gait with patient using the  walker and trying to get her to bend the right knee, worked on her walking with a SPC and hand hold assist Manual Therapy, gentle scar and STM around the patella and thigh while moving the knee into flexion and extension, verbal and tactile cues to relax the hip and allow the motions Red tband HS curls, foot slides and assisted foot slides, SAQ  with assist and then without, some LAQ with red tband assist, stnading marches and HS curls and weight shifts Vaso medium pressure 35 degrees right knee  06/14/21  Stair training with RW and MinA for safety  Knee flexion AAROM with sheet 2x10 RLE Long sit hip ABD 2x10 R LE   SLR unable to do   [employer] with towel 2x10 R LE   Standing abd and ext x10  Calf raises x10    Vaso 10 mins, high pressure, 71F   PATIENT EDUCATION:  Education details: POC, HEP, how to do self massage to decrease edema, to try to do exercises to activate muscles rather than using CPM  Person educated: Patient Education method: Explanation, Demonstration, and Handouts Education comprehension: verbalized understanding, returned demonstration, and needs further education   HOME EXERCISE PROGRAM: [email or web address]  ASSESSMENT:  CLINICAL IMPRESSION: Still guards and holds knee in ext with gait but with cuing relaxinga nd tolerating increased ROM  OBJECTIVE IMPAIRMENTS Abnormal gait, decreased activity tolerance, decreased balance, decreased endurance, decreased knowledge of use of DME, decreased mobility, difficulty walking, decreased ROM, decreased strength, hypomobility, increased edema, increased fascial restrictions, impaired perceived functional ability, increased muscle spasms, impaired flexibility, impaired UE functional use, improper body mechanics, postural dysfunction, and pain.   ACTIVITY LIMITATIONS sitting, standing, squatting, stairs, transfers, bed mobility, and locomotion level  PARTICIPATION LIMITATIONS: community activity, occupation, and yard  work  PERSONAL FACTORS Behavior pattern, Past/current experiences, and Time since onset of injury/illness/exacerbation are also affecting patient's functional outcome.   REHAB POTENTIAL: Good  CLINICAL DECISION MAKING: Stable/uncomplicated  EVALUATION COMPLEXITY: Low   GOALS: Goals reviewed with patient? Yes  SHORT TERM GOALS: Target date: 07/13/21 Will be compliant with appropriate HEP  Baseline: Goal status: partially met  2.  R knee flexion ROM to be at least 90 degrees  Baseline:  Goal status: passively partially met  3.  Will be able to navigate steps in the front of her house with [facility] and no more than stand by assist  Baseline:  Goal status: on going  4.  Pain in R knee to be no more than 6/10  Baseline:  Goal status: INITIAL   LONG TERM GOALS: Target date: 08/10/21  MMT in R LE to be at least 4/5  Baseline:  Goal status: INITIAL  2.  Pain in R knee to be no more than 3/10  Baseline:  Goal status: INITIAL  3.  Will be able to reciprocally ascend and descend steps with no device and no increase in pain  Baseline:  Goal status: INITIAL  4.  Will be able to ambulate community distances with no device and R knee pain no more than 3/10 Baseline:  Goal status: INITIAL  5.  R knee extension ROM to be 0 degrees and flexion ROM to be at least 110 degrees  Baseline:  Goal status: INITIAL  6.  FOTO score to be at least 50 to show improvement in status  Baseline:  Goal status: INITIAL   PLAN: PT FREQUENCY:  2-3 times per week   PT DURATION: 8 weeks  PLANNED INTERVENTIONS: Therapeutic exercises, Therapeutic activity, Neuromuscular re-education, Balance training, Gait training, Patient/Family education, Joint mobilization, DME instructions, Dry Needling, Electrical stimulation, Cryotherapy, Moist heat, scar mobilization, Taping, Vasopneumatic device, Ultrasound, Ionotophoresis 5m/ml Dexamethasone, Manual therapy, and Re-evaluation  PLAN FOR NEXT SESSION: really  try to get the flexion back and her moving more naturally

## 2021-06-25 ENCOUNTER — Ambulatory Visit: Payer: PRIVATE HEALTH INSURANCE | Admitting: Physical Therapy

## 2021-06-25 DIAGNOSIS — M25561 Pain in right knee: Secondary | ICD-10-CM | POA: Diagnosis not present

## 2021-06-25 DIAGNOSIS — R600 Localized edema: Secondary | ICD-10-CM

## 2021-06-25 DIAGNOSIS — R262 Difficulty in walking, not elsewhere classified: Secondary | ICD-10-CM

## 2021-06-25 DIAGNOSIS — M25661 Stiffness of right knee, not elsewhere classified: Secondary | ICD-10-CM

## 2021-06-25 DIAGNOSIS — M6281 Muscle weakness (generalized): Secondary | ICD-10-CM

## 2021-06-29 ENCOUNTER — Encounter: Payer: Self-pay | Admitting: Physical Therapy

## 2021-06-29 ENCOUNTER — Ambulatory Visit: Payer: PRIVATE HEALTH INSURANCE | Admitting: Physical Therapy

## 2021-06-29 DIAGNOSIS — R262 Difficulty in walking, not elsewhere classified: Secondary | ICD-10-CM

## 2021-06-29 DIAGNOSIS — M25561 Pain in right knee: Secondary | ICD-10-CM

## 2021-06-29 DIAGNOSIS — M25661 Stiffness of right knee, not elsewhere classified: Secondary | ICD-10-CM

## 2021-06-29 DIAGNOSIS — R600 Localized edema: Secondary | ICD-10-CM

## 2021-06-29 DIAGNOSIS — M6281 Muscle weakness (generalized): Secondary | ICD-10-CM

## 2021-06-29 NOTE — Therapy (Signed)
OUTPATIENT PHYSICAL THERAPY LOWER EXTREMITY   Patient Name: Melinda Benton MRN: [id number] DOB:1960/04/14, 61 y.o., female Today's Date: 06/29/2021   PT End of Session - 06/29/21 1701     Visit Number 7    Number of Visits 25    Date for PT Re-Evaluation 08/05/21    Authorization Type [employer]    Authorization Time Period 06/10/21 to 08/05/21    Authorization - Number of Visits 30    PT Start Time 1658    PT Stop Time 1800    PT Time Calculation (min) 62 min    Activity Tolerance Patient tolerated treatment well    Behavior During Therapy [facility] for tasks assessed/performed               Past Medical History:  Diagnosis Date   Arthritis    Clotting disorder ([facility])    Cough 05/19/2014   H/O blood clots    History of chicken pox    Migraines    Mitral valve prolapse    Thyroid disease    Past Surgical History:  Procedure Laterality Date   bladder sur     CESAREAN SECTION     2   CHOLECYSTECTOMY N/A 07/07/2019   Procedure: LAPAROSCOPIC CHOLECYSTECTOMY WITH INTRAOPERATIVE CHOLANGIOGRAM;  Surgeon: Griselda Miner, MD;  Location: [facility];  Service: General;  Laterality: N/A;   ELBOW SURGERY  2009   knee repla Left 07/2016   partial knee replacement    ROTATOR CUFF REPAIR  2011   TONSILLECTOMY  ?1968   Patient Active Problem List   Diagnosis Date Noted   Age-related osteoporosis without current pathological fracture 05/07/2021   Protein S deficiency ([facility]) 11/24/2020   Family history of diabetes mellitus 04/21/2020   Hyperlipidemia 04/21/2020   Nontoxic single thyroid nodule 04/21/2020   Weight gain 04/21/2020   Closed nondisplaced fracture of proximal phalanx of lesser toe of right foot 02/18/2020   Closed nondisplaced fracture of proximal phalanx of lesser toe of left foot 12/02/2019   S/P laparoscopic cholecystectomy 07/11/2019   Family history of colon cancer 11/05/2018   Hypothyroidism 11/05/2018   Chronic left shoulder pain 03/06/2018   GAD (generalized  anxiety disorder) 03/20/2017   S/P left unicompartmental knee replacement 07/19/2016   Chronic pain of right knee 06/07/2016   Spider varicose veins 09/01/2015   Palpitations 10/29/2013   Arthritis 10/09/2013   Anticoagulant long-term use 08/04/2010   Pulmonary embolus ([facility]) 02/15/2010   Hypercoagulable state ([facility]) 02/15/2010    PCP: Anne Ng   REFERRING PROVIDER: Dannielle Huh, MD   REFERRING DIAG: s/p R Medial Partial Knee-5/31 Post-op PT Call patient to schedule. Surg is on 5/31, start PT=6/8 pm or 06/11/21.   THERAPY DIAG:  Acute pain of right knee  Localized edema  Muscle weakness (generalized)  Stiffness of right knee, not elsewhere classified  Difficulty in walking, not elsewhere classified  Rationale for Evaluation and Treatment Rehabilitation  ONSET DATE: 05/13/2021   SUBJECTIVE:   SUBJECTIVE STATEMENT:  Patient reports very busy weekend, shopping, out a lot and on feet most of the days, reports stiff and swollen and sore  PERTINENT HISTORY: Hx PE, OA, hx phalanx fractures B feet, hypercoaguloable state, L shoulder pain, TKR   PAIN:  Are you having pain? Yes: NPRS scale: 3/10 Pain location: R knee  Pain description: post-op pain  Aggravating factors: bearing weight with bending it I have pain up to 9/10 Relieving factors: ice   PRECAUTIONS: None WBAT   WEIGHT BEARING  RESTRICTIONS No  FALLS:  Has patient fallen in last 6 months? 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)  PLOF: Independent, Independent with basic ADLs, Independent with gait, and Independent with transfers  PATIENT GOALS get back to 100%, get better ROM    OBJECTIVE:     PATIENT SURVEYS:  FOTO  11.9  COGNITION:  Overall cognitive status: Within functional limits for tasks assessed        LOWER EXTREMITY ROM:  Active ROM Right eval Left eval Right  06/15/21 Right PROM 06/22/21 PROM 06/29/21  Hip flexion       Hip extension       Hip abduction       Hip adduction       Hip internal rotation       Hip external rotation       Knee flexion 42  62 85 95  Knee extension 5      Ankle dorsiflexion       Ankle plantarflexion       Ankle inversion       Ankle eversion        (Blank rows = not tested)  LOWER EXTREMITY MMT:  MMT Right eval Left eval  Hip flexion 2 4+  Hip extension    Hip abduction    Hip adduction    Hip internal rotation    Hip external rotation    Knee flexion 2+ 4+  Knee extension 2 4+  Ankle dorsiflexion 3- 5  Ankle plantarflexion    Ankle inversion    Ankle eversion     (Blank rows = not tested)   GAIT: Distance walked: in clinic distances Assistive device utilized: SPC Level of assistance: Modified independence Comments:cues to bend the knee at toe off and be a little more natural    TODAY'S TREATMENT:  06/29/21 Nustep Level 5 x 6 minutes Gait outside with and without SPC supervision only 4" and 6" stairs working on up and down on the 4" side step over step, then up step over step ont eh 6:, Lunge step stretch PROM flexion LAQ no weight cues  Bike partial Revs Practiced walking and trying to get her to take a natural bend in the knee at toe off without device   06/25/21  Nustep L 5 PROM flex and ext with STM to quad AROM sitting after 2-93 Red tband HS curl sitting 2 sets 10 LAQ 2# 2 sets 10 Leg Press 40# 2 sets 10 Calf raises40# 2 sets 10 VASO   06/24/21 Nustep L 5 6 min Resisted Gait 30# 5 x fwd/back, 3 each side 6 in box step tap 10 4 in step up 10 x with UE assist Leg press 40 # 2 sets 10 Red tband HS curl 2 sets 10 LAQ 2# 3 sets 5 PROM flex/ext and jt capsule stretch      06/23/22: Nustep Level 5  x 6 minutes, practiced walking as noted above,  PROM, STM and joint distraction to gain ROM and decrease pain Knee flexion 20# both legs 2x10. Leg press 20# both legs 2x10, then no weight both legs working on flexion ROM, then right only small ROM no weight 2x5 LAQ a lot of cues  to get the motions and get TKE without a lot of hip flexor Seated yellow tband HS curl SAQ 2x10 in supine Vaso Medium pressure right knee x 10 minutes VASO     PATIENT EDUCATION:  Education details: Lunge step stretch, seated flexion stretch, LAQ Person educated: Patient Education method: Programmer, multimedia, Demonstration, and Handouts Education comprehension: verbalized understanding, returned demonstration, and needs further education   HOME EXERCISE PROGRAM: [email or web address]  ASSESSMENT:  CLINICAL IMPRESSION: Patient continues to improve with PrOM, she is still very tender and tight, avoids pain, once she gets going she does better but has to ease in and take her time, tends to hike the hip up. I really worked with her on gait and normalizing the flexion and worked on stairs, gave ideas on what she is to do the next week whileon vacation  OBJECTIVE IMPAIRMENTS Abnormal gait, decreased activity tolerance, decreased balance, decreased endurance, decreased knowledge of use of DME, decreased mobility, difficulty walking, decreased ROM, decreased strength, hypomobility, increased edema, increased fascial restrictions, impaired perceived functional ability, increased muscle spasms, impaired flexibility, impaired UE functional use, improper body mechanics, postural dysfunction, and pain.   ACTIVITY LIMITATIONS sitting, standing, squatting, stairs, transfers, bed mobility, and locomotion level  PARTICIPATION LIMITATIONS: community activity, occupation, and yard work  PERSONAL FACTORS Behavior pattern, Past/current experiences, and Time since onset of injury/illness/exacerbation are also affecting patient's functional outcome.    REHAB POTENTIAL: Good  CLINICAL DECISION MAKING: Stable/uncomplicated  EVALUATION COMPLEXITY: Low   GOALS: Goals reviewed with patient? Yes  SHORT TERM GOALS: Target date: 07/13/21 Will be compliant with appropriate HEP  Baseline: Goal status: met  2.  R knee flexion ROM to be at least 90 degrees  Baseline:  Goal status: met  3.  Will be able to navigate steps in the front of her house with [facility] and no more than stand by assist  Baseline:  Goal status: partially met  4.  Pain in R knee to be no more than 6/10  Baseline:  Goal status: on going  LONG TERM GOALS: Target date: 08/10/21  MMT in R LE to be at least 4/5  Baseline:  Goal status: INITIAL  2.  Pain in R knee to be no more than 3/10  Baseline:  Goal status: INITIAL  3.  Will be able to reciprocally ascend and descend steps with no device and no increase in pain  Baseline:  Goal status: INITIAL  4.  Will be able to ambulate community distances with no device and R knee pain no more than 3/10 Baseline:  Goal status: INITIAL  5.  R knee extension ROM to be 0 degrees and flexion ROM to be at least 110 degrees  Baseline:  Goal status: INITIAL  6.  FOTO score to be at least 50 to show improvement in status  Baseline:  Goal status: INITIAL   PLAN: PT FREQUENCY:  2-3 times per week   PT DURATION: 8 weeks  PLANNED INTERVENTIONS: Therapeutic exercises, Therapeutic activity, Neuromuscular re-education, Balance training, Gait training, Patient/Family education, Joint mobilization, DME instructions, Dry Needling, Electrical stimulation, Cryotherapy, Moist heat, scar mobilization, Taping, Vasopneumatic device, Ultrasound, Ionotophoresis 4mg /ml Dexamethasone, Manual therapy, and Re-evaluation  PLAN FOR NEXT SESSION: really try to get the flexion back and her moving more naturally  Stacie Glaze, PT

## 2021-07-01 ENCOUNTER — Ambulatory Visit: Payer: PRIVATE HEALTH INSURANCE | Admitting: Physical Therapy

## 2021-07-02 ENCOUNTER — Telehealth: Payer: Self-pay

## 2021-07-07 NOTE — Telephone Encounter (Signed)
Called pt to schedule overdue mammogram.Pt declined, stating mammogram was done at her OBGYN office in Nov 2022.

## 2021-07-13 ENCOUNTER — Ambulatory Visit: Payer: PRIVATE HEALTH INSURANCE | Attending: Orthopedic Surgery | Admitting: Physical Therapy

## 2021-07-13 ENCOUNTER — Encounter: Payer: Self-pay | Admitting: Physical Therapy

## 2021-07-13 DIAGNOSIS — M25661 Stiffness of right knee, not elsewhere classified: Secondary | ICD-10-CM | POA: Diagnosis present

## 2021-07-13 DIAGNOSIS — M6281 Muscle weakness (generalized): Secondary | ICD-10-CM | POA: Diagnosis present

## 2021-07-13 DIAGNOSIS — R600 Localized edema: Secondary | ICD-10-CM | POA: Diagnosis present

## 2021-07-13 DIAGNOSIS — R262 Difficulty in walking, not elsewhere classified: Secondary | ICD-10-CM | POA: Insufficient documentation

## 2021-07-13 DIAGNOSIS — M25561 Pain in right knee: Secondary | ICD-10-CM | POA: Diagnosis not present

## 2021-07-13 NOTE — Therapy (Signed)
OUTPATIENT PHYSICAL THERAPY LOWER EXTREMITY   Patient Name: Melinda Benton MRN: [id number] DOB:Nov 03, 1960, 61 y.o., female Today's Date: 07/13/2021   PT End of Session - 07/13/21 1656     Visit Number 8    Number of Visits 25    Date for PT Re-Evaluation 08/05/21    PT Start Time 1657    PT Stop Time 1745    PT Time Calculation (min) 48 min    Activity Tolerance Patient tolerated treatment well    Behavior During Therapy [facility] for tasks assessed/performed               Past Medical History:  Diagnosis Date   Arthritis    Clotting disorder ([place])    Cough 05/19/2014   H/O blood clots    History of chicken pox    Migraines    Mitral valve prolapse    Thyroid disease    Past Surgical History:  Procedure Laterality Date   bladder sur     CESAREAN SECTION     2   CHOLECYSTECTOMY N/A 07/07/2019   Procedure: LAPAROSCOPIC CHOLECYSTECTOMY WITH INTRAOPERATIVE CHOLANGIOGRAM;  Surgeon: Jovita Kussmaul, MD;  Location: [facility];  Service: General;  Laterality: N/A;   ELBOW SURGERY  2009   knee repla Left 07/2016   partial knee replacement    ROTATOR CUFF REPAIR  2011   TONSILLECTOMY  ?1968   Patient Active Problem List   Diagnosis Date Noted   Age-related osteoporosis without current pathological fracture 05/07/2021   Protein S deficiency ([place]) 11/24/2020   Family history of diabetes mellitus 04/21/2020   Hyperlipidemia 04/21/2020   Nontoxic single thyroid nodule 04/21/2020   Weight gain 04/21/2020   Closed nondisplaced fracture of proximal phalanx of lesser toe of right foot 02/18/2020   Closed nondisplaced fracture of proximal phalanx of lesser toe of left foot 12/02/2019   S/P laparoscopic cholecystectomy 07/11/2019   Family history of colon cancer 11/05/2018   Hypothyroidism 11/05/2018   Chronic left shoulder pain 03/06/2018   GAD (generalized anxiety disorder) 03/20/2017   S/P left unicompartmental knee replacement 07/19/2016   Chronic pain of right knee 06/07/2016    Spider varicose veins 09/01/2015   Palpitations 10/29/2013   Arthritis 10/09/2013   Anticoagulant long-term use 08/04/2010   Pulmonary embolus ([place]) 02/15/2010   Hypercoagulable state ([place]) 02/15/2010    PCP: [facility], [place] PROVIDER: Vickey Huger, MD   REFERRING DIAG: s/p R Medial Partial Knee-5/31 Post-op PT Call patient to schedule. Surg is on 5/31, start PT=6/8 pm or 06/11/21.   THERAPY DIAG:  Acute pain of right knee  Localized edema  Muscle weakness (generalized)  Stiffness of right knee, not elsewhere classified  Difficulty in walking, not elsewhere classified  Rationale for Evaluation and Treatment Rehabilitation  ONSET DATE: 05/13/2021   SUBJECTIVE:   SUBJECTIVE STATEMENT:  Did well on the vacation, did a lot of walking, tried to stretch it everyday, but frustrated with it seems to tightne right back up PERTINENT HISTORY: Hx PE, OA, hx phalanx fractures B feet, hypercoaguloable state, L shoulder pain, TKR   PAIN:  Are you having pain? No pain, just tightness  PRECAUTIONS: None WBAT   WEIGHT BEARING RESTRICTIONS No  FALLS:  Has patient fallen in last 6 months? 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)  PLOF: Independent, Independent with basic ADLs, Independent with gait, and Independent with transfers  PATIENT GOALS get back to 100%, get better ROM    OBJECTIVE:     PATIENT SURVEYS:  FOTO 11.9   LOWER EXTREMITY ROM:  Active ROM Right eval Left eval Right  06/15/21 Right PROM 06/22/21 PROM 06/29/21 AROM 07/13/21 PROM 07/13/21  Hip flexion         Hip extension         Hip abduction         Hip adduction         Hip internal rotation         Hip  external rotation         Knee flexion 42  62 85 95 102 110  Knee extension 5        Ankle dorsiflexion         Ankle plantarflexion         Ankle inversion         Ankle eversion          (Blank rows = not tested)  LOWER EXTREMITY MMT:  MMT Right eval Left eval  Hip flexion 2 4+  Hip extension    Hip abduction    Hip adduction    Hip internal rotation    Hip external rotation    Knee flexion 2+ 4+  Knee extension 2 4+  Ankle dorsiflexion 3- 5  Ankle plantarflexion    Ankle inversion    Ankle eversion     (Blank rows = not tested)   GAIT: Distance walked: in clinic distances Assistive device utilized: SPC Level of assistance: Modified independence Comments:cues to bend the knee at toe off and be a little more natural    TODAY'S TREATMENT: 07/13/21 Nustep level 5 LE only x 6 minutes Bike  partial revs x 5 minutes Stairs step over step up and down, some cues to avoid circumduction Leg press 20# 2x10, then right only x 10, then no weight working on the flexion Gait outside negotiating curbs Lunge step stretch PROM right knee LAQ cues for TKE  06/29/21 Nustep Level 5 x 6 minutes Gait outside with and without SPC supervision only 4" and 6" stairs working on up and down on the 4" side step over step, then up step over step ont eh 6:, Lunge step stretch PROM flexion LAQ no weight cues  Bike partial Revs Practiced walking and trying to get her to take a natural bend in the knee at toe off without device   06/25/21  Nustep L 5 29mn PROM flex and ext with STM to quad AROM sitting after 2-93 Red tband HS curl sitting 2 sets 10 LAQ 2# 2 sets 10 Leg Press 40# 2 sets 10 Calf raises40# 2 sets 10 VASO    PATIENT EDUCATION:  Education details: Lunge step stretch, seated flexion stretch, LAQ Person educated: Patient Education method: Explanation, Demonstration, and Handouts Education comprehension: verbalized understanding, returned demonstration, and needs  further education   HOME EXERCISE PROGRAM: [id number] ASSESSMENT:  CLINICAL IMPRESSION: Patient is doing much better overall, less pain, less stiffness and much improved ROM.  She still has some limitation with flexion, tends to avoid this as this causes pain.  Her gait is now without a device and has a very minimal deviation at toe off. Still c/o the pain at the top of  the scar with flexion  OBJECTIVE IMPAIRMENTS Abnormal gait, decreased activity tolerance, decreased balance, decreased endurance, decreased knowledge of use of DME, decreased mobility, difficulty walking, decreased ROM, decreased strength, hypomobility, increased edema, increased fascial restrictions, impaired perceived functional ability, increased muscle spasms, impaired flexibility, impaired UE functional use, improper body mechanics, postural dysfunction, and pain.   ACTIVITY LIMITATIONS sitting, standing, squatting, stairs, transfers, bed mobility, and locomotion level  PARTICIPATION LIMITATIONS: community activity, occupation, and yard work  PERSONAL FACTORS Behavior pattern, Past/current experiences, and Time since onset of injury/illness/exacerbation are also affecting patient's functional outcome.   REHAB POTENTIAL: Good  CLINICAL DECISION MAKING: Stable/uncomplicated  EVALUATION COMPLEXITY: Low   GOALS: Goals reviewed with patient? Yes  SHORT TERM GOALS: Target date: 07/13/21 Will be compliant with appropriate HEP  Baseline: Goal status: met  2.  R knee flexion ROM to be at least 90 degrees  Baseline:  Goal status: met  3.  Will be able to navigate steps in the front of her house with [facility] and no more than stand by assist  Baseline:  Goal status: met  4.  Pain in R knee to be no more than 6/10  Baseline:  Goal status: met  LONG TERM GOALS: Target date: 08/10/21  MMT in R LE to be at least 4/5  Baseline:  Goal status:progressing  2.  Pain in R knee to be no more than 3/10  Baseline:  Goal  status: partially met  3.  Will be able to reciprocally ascend and descend steps with no device and no increase in pain  Baseline:  Goal status: progressing  4.  Will be able to ambulate community distances with no device and R knee pain no more than 3/10 Baseline:  Goal status:partially met  5.  R knee extension ROM to be 0 degrees and flexion ROM to be at least 110 degrees  Baseline:  Goal status: INITIAL  6.  FOTO score to be at least 50 to show improvement in status  Baseline:  Goal status: INITIAL   PLAN: PT FREQUENCY:  2-3 times per week   PT DURATION: 8 weeks  PLANNED INTERVENTIONS: Therapeutic exercises, Therapeutic activity, Neuromuscular re-education, Balance training, Gait training, Patient/Family education, Joint mobilization, DME instructions, Dry Needling, Electrical stimulation, Cryotherapy, Moist heat, scar mobilization, Taping, Vasopneumatic device, Ultrasound, Ionotophoresis 26m/ml Dexamethasone, Manual therapy, and Re-evaluation  PLAN FOR NEXT SESSION: really try to get the flexion back and her moving more naturally  MLum Babe PT

## 2021-07-15 ENCOUNTER — Ambulatory Visit: Payer: PRIVATE HEALTH INSURANCE | Admitting: Physical Therapy

## 2021-07-20 ENCOUNTER — Ambulatory Visit (INDEPENDENT_AMBULATORY_CARE_PROVIDER_SITE_OTHER): Payer: PRIVATE HEALTH INSURANCE

## 2021-07-20 ENCOUNTER — Encounter: Payer: Self-pay | Admitting: Physical Therapy

## 2021-07-20 ENCOUNTER — Ambulatory Visit: Payer: PRIVATE HEALTH INSURANCE | Admitting: Physical Therapy

## 2021-07-20 DIAGNOSIS — Z7901 Long term (current) use of anticoagulants: Secondary | ICD-10-CM | POA: Diagnosis not present

## 2021-07-20 DIAGNOSIS — R262 Difficulty in walking, not elsewhere classified: Secondary | ICD-10-CM

## 2021-07-20 DIAGNOSIS — R600 Localized edema: Secondary | ICD-10-CM

## 2021-07-20 DIAGNOSIS — M25561 Pain in right knee: Secondary | ICD-10-CM | POA: Diagnosis not present

## 2021-07-20 DIAGNOSIS — M25661 Stiffness of right knee, not elsewhere classified: Secondary | ICD-10-CM

## 2021-07-20 DIAGNOSIS — M6281 Muscle weakness (generalized): Secondary | ICD-10-CM

## 2021-07-20 LAB — POCT INR: INR: 4.1 — AB (ref 2.0–3.0)

## 2021-07-20 NOTE — Therapy (Signed)
OUTPATIENT PHYSICAL THERAPY LOWER EXTREMITY   Patient Name: Melinda Benton MRN: [id number] DOB:07-28-1960, 61 y.o., female Today's Date: 07/20/2021   PT End of Session - 07/20/21 1539     Visit Number 9    Date for PT Re-Evaluation 08/05/21    Authorization Type [employer]    Authorization Time Period 06/10/21 to 08/05/21    PT Start Time 1530    PT Stop Time 1615    PT Time Calculation (min) 45 min    Activity Tolerance Patient tolerated treatment well    Behavior During Therapy [facility] for tasks assessed/performed               Past Medical History:  Diagnosis Date   Arthritis    Clotting disorder ([place])    Cough 05/19/2014   H/O blood clots    History of chicken pox    Migraines    Mitral valve prolapse    Thyroid disease    Past Surgical History:  Procedure Laterality Date   bladder sur     CESAREAN SECTION     2   CHOLECYSTECTOMY N/A 07/07/2019   Procedure: LAPAROSCOPIC CHOLECYSTECTOMY WITH INTRAOPERATIVE CHOLANGIOGRAM;  Surgeon: Jovita Kussmaul, MD;  Location: [facility];  Service: General;  Laterality: N/A;   ELBOW SURGERY  2009   knee repla Left 07/2016   partial knee replacement    ROTATOR CUFF REPAIR  2011   TONSILLECTOMY  ?1968   Patient Active Problem List   Diagnosis Date Noted   Age-related osteoporosis without current pathological fracture 05/07/2021   Protein S deficiency ([place]) 11/24/2020   Family history of diabetes mellitus 04/21/2020   Hyperlipidemia 04/21/2020   Nontoxic single thyroid nodule 04/21/2020   Weight gain 04/21/2020   Closed nondisplaced fracture of proximal phalanx of lesser toe of right foot 02/18/2020   Closed nondisplaced fracture of proximal phalanx of lesser toe of left foot 12/02/2019   S/P laparoscopic cholecystectomy 07/11/2019   Family history of colon cancer 11/05/2018   Hypothyroidism 11/05/2018   Chronic left shoulder pain 03/06/2018   GAD (generalized anxiety disorder) 03/20/2017   S/P left unicompartmental knee  replacement 07/19/2016   Chronic pain of right knee 06/07/2016   Spider varicose veins 09/01/2015   Palpitations 10/29/2013   Arthritis 10/09/2013   Anticoagulant long-term use 08/04/2010   Pulmonary embolus ([place]) 02/15/2010   Hypercoagulable state ([place]) 02/15/2010    PCP: N[facility], [place] PROVIDER: Vickey Huger, MD   REFERRING DIAG: s/p R Medial Partial Knee-5/31 Post-op PT Call patient to schedule. Surg is on 5/31, start PT=6/8 pm or 06/11/21.   THERAPY DIAG:  Acute pain of right knee  Localized edema  Muscle weakness (generalized)  Stiffness of right knee, not elsewhere classified  Difficulty in walking, not elsewhere classified  Rationale for Evaluation and Treatment Rehabilitation  ONSET DATE: 05/13/2021   SUBJECTIVE:   SUBJECTIVE STATEMENT:  Did well on the vacation, did a lot of walking, tried to stretch it everyday, but frustrated with it seems to tightne right back up PERTINENT HISTORY: Hx PE, OA, hx phalanx fractures B feet, hypercoaguloable state, L shoulder pain, TKR   PAIN:  Are you having pain? No pain, just tightness  PRECAUTIONS: None WBAT   WEIGHT BEARING RESTRICTIONS No  FALLS:  Has patient fallen in last 6 months? 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)  PLOF: Independent, Independent with basic ADLs, Independent with gait, and Independent with transfers  PATIENT GOALS get back to 100%, get better ROM    OBJECTIVE:     PATIENT SURVEYS:  FOTO 11.9   LOWER EXTREMITY ROM:  Active ROM Right eval Left eval Right  06/15/21 Right PROM 06/22/21 PROM 06/29/21 AROM 07/13/21 PROM 07/13/21  Hip flexion         Hip extension         Hip abduction          Hip adduction         Hip internal rotation         Hip external rotation         Knee flexion 42  62 85 95 102 110  Knee extension 5        Ankle dorsiflexion         Ankle plantarflexion         Ankle inversion         Ankle eversion          (Blank rows = not tested)  LOWER EXTREMITY MMT:  MMT Right eval Left eval  Hip flexion 2 4+  Hip extension    Hip abduction    Hip adduction    Hip internal rotation    Hip external rotation    Knee flexion 2+ 4+  Knee extension 2 4+  Ankle dorsiflexion 3- 5  Ankle plantarflexion    Ankle inversion    Ankle eversion     (Blank rows = not tested)   GAIT: Distance walked: in clinic distances Assistive device utilized: SPC Level of assistance: Modified independence Comments:cues to bend the knee at toe off and be a little more natural    TODAY'S TREATMENT: 07/20/21 Nustep level 5 x 5 minutes Bike x 5 minutes Passive knee flexion 110 degrees, active 104 degrees Worked on getting onto and up off of the floor, did some problem solving,  Partial squats 2x10 Gait outside and then did a 6 rung ladder as she will have to do this in about 4 weeks, did one at a time and then step over step  07/13/21 Nustep level 5 LE only x 6 minutes Bike  partial revs x 5 minutes Stairs step over step up and down, some cues to avoid circumduction Leg press 20# 2x10, then right only x 10, then no weight working on the flexion Gait outside negotiating curbs Lunge step stretch PROM right knee LAQ cues for TKE  06/29/21 Nustep Level 5 x 6 minutes Gait outside with and without SPC supervision only 4" and 6" stairs working on up and down on the 4" side step over step, then up step over step ont eh 6:, Lunge step stretch PROM flexion LAQ no weight cues  Bike partial Revs Practiced walking and trying to get her to take a natural bend in the knee at toe off without device   06/25/21  Nustep L 5 41mn PROM flex and ext with STM to quad AROM  sitting after 2-93 Red tband HS curl sitting 2 sets 10 LAQ 2# 2 sets 10 Leg Press 40# 2 sets 10 Calf raises40# 2 sets 10 VASO    PATIENT EDUCATION:  Education details: Lunge step stretch, seated flexion stretch, LAQ Person educated: Patient Education method: Explanation, Demonstration, and Handouts  Education comprehension: verbalized understanding, returned demonstration, and needs further education   HOME EXERCISE PROGRAM: [id number]  ASSESSMENT:  CLINICAL IMPRESSION: Patient continues to improve, she does have some issues with thinking about her job and going back to working out, wnet over how to safely go back, we did have to do some problem solving and practicing things and she was able to do with a little modification  OBJECTIVE IMPAIRMENTS Abnormal gait, decreased activity tolerance, decreased balance, decreased endurance, decreased knowledge of use of DME, decreased mobility, difficulty walking, decreased ROM, decreased strength, hypomobility, increased edema, increased fascial restrictions, impaired perceived functional ability, increased muscle spasms, impaired flexibility, impaired UE functional use, improper body mechanics, postural dysfunction, and pain.   ACTIVITY LIMITATIONS sitting, standing, squatting, stairs, transfers, bed mobility, and locomotion level  PARTICIPATION LIMITATIONS: community activity, occupation, and yard work  PERSONAL FACTORS Behavior pattern, Past/current experiences, and Time since onset of injury/illness/exacerbation are also affecting patient's functional outcome.   REHAB POTENTIAL: Good  CLINICAL DECISION MAKING: Stable/uncomplicated  EVALUATION COMPLEXITY: Low   GOALS: Goals reviewed with patient? Yes  SHORT TERM GOALS: Target date: 07/13/21 Will be compliant with appropriate HEP  Baseline: Goal status: met  2.  R knee flexion ROM to be at least 90 degrees  Baseline:  Goal status: met  3.  Will be able to navigate steps in the  front of her house with [facility] and no more than stand by assist  Baseline:  Goal status: met  4.  Pain in R knee to be no more than 6/10  Baseline:  Goal status: met  LONG TERM GOALS: Target date: 08/10/21  MMT in R LE to be at least 4/5  Baseline:  Goal status:progressing  2.  Pain in R knee to be no more than 3/10  Baseline:  Goal status: partially met  3.  Will be able to reciprocally ascend and descend steps with no device and no increase in pain  Baseline:  Goal status: progressing  4.  Will be able to ambulate community distances with no device and R knee pain no more than 3/10 Baseline:  Goal status:partially met  5.  R knee extension ROM to be 0 degrees and flexion ROM to be at least 110 degrees  Baseline:  Goal status:partially met  6.  FOTO score to be at least 50 to show improvement in status  Baseline:  Goal status: INITIAL   PLAN: PT FREQUENCY:  2-3 times per week   PT DURATION: 8 weeks  PLANNED INTERVENTIONS: Therapeutic exercises, Therapeutic activity, Neuromuscular re-education, Balance training, Gait training, Patient/Family education, Joint mobilization, DME instructions, Dry Needling, Electrical stimulation, Cryotherapy, Moist heat, scar mobilization, Taping, Vasopneumatic device, Ultrasound, Ionotophoresis 31m/ml Dexamethasone, Manual therapy, and Re-evaluation  PLAN FOR NEXT SESSION continue ti problem solve and get her more confident   MLum Babe PT

## 2021-07-20 NOTE — Progress Notes (Addendum)
Pt reports she was confused and did not change her weekly dose to take 1/2 tablet 3 days a week. So no change to weekly dose has been made. She will start the dosing schedule given at last apt. She has also not been eating as many greens because she was concerned her INR will be too low.   Hold dose tomorrow and reduce dose on Thursday to take 1/2 tablet and then change weekly dose to take 6mg  daily except take 3mg  on Monday, Wednesdays and Saturdays. Recheck on 3 weeks per pt request due to being out of town for work.

## 2021-07-20 NOTE — Patient Instructions (Addendum)
Pre visit review using our clinic review tool, if applicable. No additional management support is needed unless otherwise documented below in the visit note.  Hold dose tomorrow and reduce dose on Thursday to take 1/2 tablet and then change weekly dose to take 6mg  daily except take 3mg  on Monday, Wednesdays and Saturdays. Recheck on 3 weeks.

## 2021-07-21 NOTE — Telephone Encounter (Signed)
Contacted [employer] representative who reports the pt's paperwork has not been processed. She is unsure of why so she will f/u with the office and then f/u with the coumadin clinic.

## 2021-07-22 ENCOUNTER — Ambulatory Visit: Payer: PRIVATE HEALTH INSURANCE | Admitting: Physical Therapy

## 2021-07-28 ENCOUNTER — Ambulatory Visit: Payer: PRIVATE HEALTH INSURANCE | Admitting: Physical Therapy

## 2021-07-28 ENCOUNTER — Encounter: Payer: Self-pay | Admitting: Physical Therapy

## 2021-07-28 ENCOUNTER — Ambulatory Visit
Admission: RE | Admit: 2021-07-28 | Discharge: 2021-07-28 | Disposition: A | Discharge status: Home / Self Care | Payer: PRIVATE HEALTH INSURANCE | Source: Ambulatory Visit | Attending: Endocrinology | Admitting: Endocrinology

## 2021-07-28 ENCOUNTER — Other Ambulatory Visit: Payer: PRIVATE HEALTH INSURANCE

## 2021-07-28 DIAGNOSIS — M25561 Pain in right knee: Secondary | ICD-10-CM

## 2021-07-28 DIAGNOSIS — R262 Difficulty in walking, not elsewhere classified: Secondary | ICD-10-CM

## 2021-07-28 DIAGNOSIS — M6281 Muscle weakness (generalized): Secondary | ICD-10-CM

## 2021-07-28 DIAGNOSIS — E041 Nontoxic single thyroid nodule: Secondary | ICD-10-CM

## 2021-07-28 DIAGNOSIS — R600 Localized edema: Secondary | ICD-10-CM

## 2021-07-28 DIAGNOSIS — M25661 Stiffness of right knee, not elsewhere classified: Secondary | ICD-10-CM

## 2021-07-28 NOTE — Therapy (Signed)
OUTPATIENT PHYSICAL THERAPY LOWER EXTREMITY   Patient Name: Melinda Benton MRN: [id number] DOB:19-Dec-1960, 61 y.o., female Today's Date: 07/28/2021   PT End of Session - 07/28/21 0804     Visit Number 10    Number of Visits 25    Date for PT Re-Evaluation 08/05/21    Authorization Type [employer]    PT Start Time 0802    PT Stop Time 0845    PT Time Calculation (min) 43 min    Activity Tolerance Patient tolerated treatment well    Behavior During Therapy [facility] for tasks assessed/performed               Past Medical History:  Diagnosis Date   Arthritis    Clotting disorder ([place])    Cough 05/19/2014   H/O blood clots    History of chicken pox    Migraines    Mitral valve prolapse    Thyroid disease    Past Surgical History:  Procedure Laterality Date   bladder sur     CESAREAN SECTION     2   CHOLECYSTECTOMY N/A 07/07/2019   Procedure: LAPAROSCOPIC CHOLECYSTECTOMY WITH INTRAOPERATIVE CHOLANGIOGRAM;  Surgeon: Jovita Kussmaul, MD;  Location: [facility];  Service: General;  Laterality: N/A;   ELBOW SURGERY  2009   knee repla Left 07/2016   partial knee replacement    ROTATOR CUFF REPAIR  2011   TONSILLECTOMY  ?1968   Patient Active Problem List   Diagnosis Date Noted   Age-related osteoporosis without current pathological fracture 05/07/2021   Protein S deficiency ([place]) 11/24/2020   Family history of diabetes mellitus 04/21/2020   Hyperlipidemia 04/21/2020   Nontoxic single thyroid nodule 04/21/2020   Weight gain 04/21/2020   Closed nondisplaced fracture of proximal phalanx of lesser toe of right foot 02/18/2020   Closed nondisplaced fracture of proximal phalanx of lesser toe of left foot 12/02/2019   S/P laparoscopic cholecystectomy 07/11/2019   Family history of colon cancer 11/05/2018   Hypothyroidism 11/05/2018   Chronic left shoulder pain 03/06/2018   GAD (generalized anxiety disorder) 03/20/2017   S/P left unicompartmental knee replacement 07/19/2016   Chronic  pain of right knee 06/07/2016   Spider varicose veins 09/01/2015   Palpitations 10/29/2013   Arthritis 10/09/2013   Anticoagulant long-term use 08/04/2010   Pulmonary embolus ([place]) 02/15/2010   Hypercoagulable state ([place]) 02/15/2010    PCP: [place], [place] PROVIDER: Vickey Huger, MD   REFERRING DIAG: s/p R Medial Partial Knee-5/31 Post-op PT Call patient to schedule. Surg is on 5/31, start PT=6/8 pm or 06/11/21.   THERAPY DIAG:  Acute pain of right knee  Localized edema  Muscle weakness (generalized)  Stiffness of right knee, not elsewhere classified  Difficulty in walking, not elsewhere classified  Rationale for Evaluation and Treatment Rehabilitation  ONSET DATE: 05/13/2021   SUBJECTIVE:   SUBJECTIVE STATEMENT:  Doing well., just the stiffness, did a long drive yesterday, I was stiff but it well, less pain and soreness PERTINENT HISTORY: Hx PE, OA, hx phalanx fractures B feet, hypercoaguloable state, L shoulder pain, TKR   PAIN:  Are you having pain? No pain, just tightness  PRECAUTIONS: None WBAT   WEIGHT BEARING RESTRICTIONS No  FALLS:  Has patient fallen in last 6 months? 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)  PLOF: Independent, Independent with basic ADLs, Independent with gait, and Independent with transfers  PATIENT GOALS get back to 100%, get better ROM    OBJECTIVE:     PATIENT SURVEYS:  Foto 59   LOWER EXTREMITY ROM:  Active ROM Right eval Left eval Right  06/15/21 Right PROM 06/22/21 PROM 06/29/21 AROM 07/13/21 PROM 07/13/21  Hip flexion         Hip extension         Hip abduction         Hip adduction         Hip internal rotation          Hip external rotation         Knee flexion 42  62 85 95 102 110  Knee extension 5        Ankle dorsiflexion         Ankle plantarflexion         Ankle inversion         Ankle eversion          (Blank rows = not tested)  LOWER EXTREMITY MMT:  MMT Right eval Left eval  Hip flexion 2 4+  Hip extension    Hip abduction    Hip adduction    Hip internal rotation    Hip external rotation    Knee flexion 2+ 4+  Knee extension 2 4+  Ankle dorsiflexion 3- 5  Ankle plantarflexion    Ankle inversion    Ankle eversion     (Blank rows = not tested)   GAIT: Distance walked: in clinic distances Assistive device utilized: SPC Level of assistance: Modified independence Comments:cues to bend the knee at toe off and be a little more natural    TODAY'S TREATMENT: 07/28/21 Nustep Level 5 x 6 minutes Bike x 6 minutes Step lunge stretch Leg press 20# 2x10 and then no weight working on flexion Leg curls 15# 2x10 Leg extension 5# both legs x10, then eccentrics right onlu PROM of the right knee, after AROM of the right knee to 111 degrees Lunge stretches  07/20/21 Nustep level 5 x 5 minutes Bike x 5 minutes Passive knee flexion 110 degrees, active 104 degrees Worked on getting onto and up off of the floor, did some problem solving,  Partial squats 2x10 Gait outside and then did a 6 rung ladder as she will have to do this in about 4 weeks, did one at a time and then step over step  07/13/21 Nustep level 5 LE only x 6 minutes Bike  partial revs x 5 minutes Stairs step over step up and down, some cues to avoid circumduction Leg press 20# 2x10, then right only x 10, then no weight working on the flexion Gait outside negotiating curbs Lunge step stretch PROM right knee LAQ cues for TKE  06/29/21 Nustep Level 5 x 6 minutes Gait outside with and without [place] supervision only 4" and 6" stairs working on up and down on the 4" side step over step, then up step over step ont eh 6:, Lunge  step stretch PROM flexion LAQ no weight cues  Bike partial Revs Practiced walking and trying to get her to take a natural bend in the knee at toe off without device   06/25/21  Nustep L 5 73mn PROM flex and ext with STM to quad AROM sitting after 2-93  Red tband HS curl sitting 2 sets 10 LAQ 2# 2 sets 10 Leg Press 40# 2 sets 10 Calf raises40# 2 sets 10 VASO    PATIENT EDUCATION:  Education details: Lunge step stretch, seated flexion stretch, LAQ Person educated: Patient Education method: Explanation, Demonstration, and Handouts Education comprehension: verbalized understanding, returned demonstration, and needs further education   HOME EXERCISE PROGRAM: [id number]  ASSESSMENT:  CLINICAL IMPRESSION: PAteint made more improvement in the ROM, she is stiff when she starts but reports feeling like she is doing better, the scar is tight and she has a little tender.  OBJECTIVE IMPAIRMENTS Abnormal gait, decreased activity tolerance, decreased balance, decreased endurance, decreased knowledge of use of DME, decreased mobility, difficulty walking, decreased ROM, decreased strength, hypomobility, increased edema, increased fascial restrictions, impaired perceived functional ability, increased muscle spasms, impaired flexibility, impaired UE functional use, improper body mechanics, postural dysfunction, and pain.   ACTIVITY LIMITATIONS sitting, standing, squatting, stairs, transfers, bed mobility, and locomotion level  PARTICIPATION LIMITATIONS: community activity, occupation, and yard work  PERSONAL FACTORS Behavior pattern, Past/current experiences, and Time since onset of injury/illness/exacerbation are also affecting patient's functional outcome.   REHAB POTENTIAL: Good  CLINICAL DECISION MAKING: Stable/uncomplicated  EVALUATION COMPLEXITY: Low   GOALS: Goals reviewed with patient? Yes  SHORT TERM GOALS: Target date: 07/13/21 Will be compliant with appropriate HEP   Baseline: Goal status: met  2.  R knee flexion ROM to be at least 90 degrees  Baseline:  Goal status: met  3.  Will be able to navigate steps in the front of her house with [facility] and no more than stand by assist  Baseline:  Goal status: met  4.  Pain in R knee to be no more than 6/10  Baseline:  Goal status: met  LONG TERM GOALS: Target date: 08/10/21  MMT in R LE to be at least 4/5  Baseline:  Goal status:progressing  2.  Pain in R knee to be no more than 3/10  Baseline:  Goal status: partially met  3.  Will be able to reciprocally ascend and descend steps with no device and no increase in pain  Baseline:  Goal status: progressing  4.  Will be able to ambulate community distances with no device and R knee pain no more than 3/10 Baseline:  Goal status:partially met  5.  R knee extension ROM to be 0 degrees and flexion ROM to be at least 110 degrees  Baseline:  Goal status:partially met  6.  FOTO score to be at least 50 to show improvement in status  Baseline:  Goal status: INITIAL   PLAN: PT FREQUENCY:  2-3 times per week   PT DURATION: 8 weeks  PLANNED INTERVENTIONS: Therapeutic exercises, Therapeutic activity, Neuromuscular re-education, Balance training, Gait training, Patient/Family education, Joint mobilization, DME instructions, Dry Needling, Electrical stimulation, Cryotherapy, Moist heat, scar mobilization, Taping, Vasopneumatic device, Ultrasound, Ionotophoresis 78m/ml Dexamethasone, Manual therapy, and Re-evaluation  PLAN FOR NEXT SESSION continue ti problem solve and get her more confident   MLum Babe PT

## 2021-07-30 ENCOUNTER — Ambulatory Visit: Payer: PRIVATE HEALTH INSURANCE | Admitting: Physical Therapy

## 2021-08-03 NOTE — Telephone Encounter (Signed)
Received msg from [employer] representative concerning home INR machine. She reported pt needs to contact [employer] so the on-boarding for the machine can begin and pt can begin testing at home. She reported it is difficult to get in touch with the pt and cannot always LVM due to it being full. She advised pt call [phone], option 1. Advised coumadin clinic will contact pt to advise to f/u with [employer].   Contacted pt and advised to call. She will call them today or tomorrow. Pt appreciative and verbalized understanding.

## 2021-08-09 ENCOUNTER — Ambulatory Visit: Payer: PRIVATE HEALTH INSURANCE | Admitting: Physical Therapy

## 2021-08-13 ENCOUNTER — Ambulatory Visit: Payer: PRIVATE HEALTH INSURANCE

## 2021-08-13 ENCOUNTER — Ambulatory Visit (INDEPENDENT_AMBULATORY_CARE_PROVIDER_SITE_OTHER): Payer: PRIVATE HEALTH INSURANCE

## 2021-08-13 DIAGNOSIS — Z7901 Long term (current) use of anticoagulants: Secondary | ICD-10-CM

## 2021-08-13 LAB — POCT INR
INR: 2.4 (ref 2.0–3.0)
INR: 2.5 (ref 2.0–3.0)

## 2021-08-13 NOTE — Telephone Encounter (Signed)
Pt reports today that an [employer] educator is coming to her house today to teach her how to use the INR machine at home. The result will be sent to the website. Advised pt this nurse will f/u with any dosing instructions after the result is received.

## 2021-08-13 NOTE — Patient Instructions (Addendum)
Pre visit review using our clinic review tool, if applicable. No additional management support is needed unless otherwise documented below in the visit note.  Continue 6mg  daily except take 3mg  on Monday, Wednesdays and Saturdays. Recheck on 2 weeks, on 08/24/21.

## 2021-08-13 NOTE — Progress Notes (Addendum)
Pt has been set up with a home INR machine with [employer]. Pt's first home test result today is 2.4. Continue 6mg  daily except take 3mg  on Monday, Wednesdays and Saturdays. Recheck on 2 weeks, on 08/24/21.  Contacted pt by phone with instructions. Pt uses mychart for AVS.

## 2021-08-20 ENCOUNTER — Other Ambulatory Visit: Payer: PRIVATE HEALTH INSURANCE

## 2021-08-23 ENCOUNTER — Ambulatory Visit: Payer: PRIVATE HEALTH INSURANCE | Admitting: Physical Therapy

## 2021-08-24 ENCOUNTER — Ambulatory Visit (INDEPENDENT_AMBULATORY_CARE_PROVIDER_SITE_OTHER): Payer: PRIVATE HEALTH INSURANCE

## 2021-08-24 DIAGNOSIS — Z7901 Long term (current) use of anticoagulants: Secondary | ICD-10-CM | POA: Diagnosis not present

## 2021-08-24 LAB — POCT INR: INR: 2.8 (ref 2.0–3.0)

## 2021-08-24 NOTE — Progress Notes (Signed)
Pt has been set up with a home INR machine with [employer]. Pt's first home test result today is 2.8. Continue 6mg  daily except take 3mg  on Monday, Wednesdays and Saturdays. Recheck on 2 weeks, on 09/08/21. Contacted pt by phone with instructions. Pt uses mychart for AVS.

## 2021-08-24 NOTE — Patient Instructions (Addendum)
Pre visit review using our clinic review tool, if applicable. No additional management support is needed unless otherwise documented below in the visit note.  Continue 6mg  daily except take 3mg  on Monday, Wednesdays and Saturdays. Recheck on 2 weeks, on 09/08/21.

## 2021-09-01 ENCOUNTER — Ambulatory Visit (INDEPENDENT_AMBULATORY_CARE_PROVIDER_SITE_OTHER): Payer: PRIVATE HEALTH INSURANCE

## 2021-09-01 VITALS — Ht 68.0 in

## 2021-09-01 DIAGNOSIS — Z23 Encounter for immunization: Secondary | ICD-10-CM

## 2021-09-01 NOTE — Progress Notes (Signed)
After obtaining consent, and per orders of [facility], injection of Shingles #1 given by [place]. Patient instructed to remain in clinic for 20 minutes afterwards, and to report any adverse reaction to me immediately.

## 2021-09-09 ENCOUNTER — Ambulatory Visit (INDEPENDENT_AMBULATORY_CARE_PROVIDER_SITE_OTHER): Payer: PRIVATE HEALTH INSURANCE

## 2021-09-09 DIAGNOSIS — D6859 Other primary thrombophilia: Secondary | ICD-10-CM

## 2021-09-09 DIAGNOSIS — Z7901 Long term (current) use of anticoagulants: Secondary | ICD-10-CM | POA: Diagnosis not present

## 2021-09-09 LAB — POCT INR
INR: 2.1 (ref 2.0–3.0)
INR: 2.1 — AB (ref <=1.20)

## 2021-09-09 NOTE — Patient Instructions (Signed)
Pre visit review using our clinic review tool, if applicable. No additional management support is needed unless otherwise documented below in the visit note.  Continue 6mg  daily except take 3mg  on Monday, Wednesdays and Saturdays. Recheck on 2 weeks, on 09/23/21.

## 2021-09-09 NOTE — Progress Notes (Addendum)
Pt has been set up with a home INR machine with [employer]. Pt's home test result today is 2.1. Continue 6mg  daily except take 3mg  on Monday, Wednesdays and Saturdays. Recheck on 2 weeks, on 09/23/21. Contacted pt by phone with instructions. Pt uses mychart for AVS.  ============== Agree.  Thanks.

## 2021-09-23 LAB — POCT INR: INR: 2.4 (ref 2.0–3.0)

## 2021-09-24 ENCOUNTER — Ambulatory Visit (INDEPENDENT_AMBULATORY_CARE_PROVIDER_SITE_OTHER): Payer: PRIVATE HEALTH INSURANCE

## 2021-09-24 DIAGNOSIS — Z7901 Long term (current) use of anticoagulants: Secondary | ICD-10-CM | POA: Diagnosis not present

## 2021-09-24 NOTE — Progress Notes (Signed)
Pt has been set up with a home INR machine with [employer]. Pt's home test result today is 2.4. Continue 6mg  daily except take 3mg  on Monday, Wednesdays and Saturdays. Recheck on 4 weeks, on 10/21/21. Contacted pt by phone with instructions. Pt uses mychart for AVS.

## 2021-09-24 NOTE — Patient Instructions (Addendum)
Pre visit review using our clinic review tool, if applicable. No additional management support is needed unless otherwise documented below in the visit note.  Continue 6mg  daily except take 3mg  on Monday, Wednesdays and Saturdays. Recheck on 4 weeks, on 10/21/21.

## 2021-10-22 ENCOUNTER — Ambulatory Visit (INDEPENDENT_AMBULATORY_CARE_PROVIDER_SITE_OTHER): Payer: PRIVATE HEALTH INSURANCE

## 2021-10-22 DIAGNOSIS — Z7901 Long term (current) use of anticoagulants: Secondary | ICD-10-CM | POA: Diagnosis not present

## 2021-10-22 LAB — POCT INR: INR: 2.7 (ref 2.0–3.0)

## 2021-10-22 NOTE — Progress Notes (Signed)
Pt tests INR at home and submits results via [employer] portal. INR from yesterday is 2.7.   Continue 6mg  daily except take 3mg  on Monday, Wednesdays and Saturdays. Recheck on 4 weeks, on 11/19/21, per pt request.  Contacted pt by phone with instructions. Pt uses mychart for AVS.

## 2021-10-22 NOTE — Patient Instructions (Addendum)
Continue 6mg  daily except take 3mg  on Monday, Wednesdays and Saturdays. Recheck on 4 weeks, on 11/19/21.

## 2021-11-23 ENCOUNTER — Ambulatory Visit (INDEPENDENT_AMBULATORY_CARE_PROVIDER_SITE_OTHER): Payer: PRIVATE HEALTH INSURANCE

## 2021-11-23 DIAGNOSIS — Z7901 Long term (current) use of anticoagulants: Secondary | ICD-10-CM | POA: Diagnosis not present

## 2021-11-23 LAB — POCT INR: INR: 2.8 (ref 2.0–3.0)

## 2021-11-23 NOTE — Patient Instructions (Signed)
Continue 6mg  daily except take 3mg  on Monday, Wednesdays and Saturdays. Recheck in 4 weeks, on 12/21/21.

## 2021-11-23 NOTE — Progress Notes (Signed)
Pt tests INR at home and submits results via [employer] portal. INR today is 2.8.   Continue 6mg  daily except take 3mg  on Monday, Wednesdays and Saturdays. Recheck in 4 weeks, on 12/21/21.  Contacted pt by phone with instructions, verbalized understanding. Pt uses mychart for AVS.

## 2021-12-09 LAB — HM MAMMOGRAPHY

## 2021-12-10 LAB — HM PAP SMEAR: HPV, high-risk: NEGATIVE

## 2021-12-22 LAB — POCT INR: INR: 2.5 (ref 2.0–3.0)

## 2021-12-23 ENCOUNTER — Ambulatory Visit (INDEPENDENT_AMBULATORY_CARE_PROVIDER_SITE_OTHER): Payer: PRIVATE HEALTH INSURANCE

## 2021-12-23 DIAGNOSIS — Z7901 Long term (current) use of anticoagulants: Secondary | ICD-10-CM | POA: Diagnosis not present

## 2021-12-23 DIAGNOSIS — D6859 Other primary thrombophilia: Secondary | ICD-10-CM

## 2021-12-23 NOTE — Progress Notes (Signed)
Pt tests INR at home and submits results via [employer] portal. INR today is 2.5.   Continue 6mg  daily except take 3mg  on Monday, Wednesdays and Saturdays. Recheck in 4 weeks, on 01/19/22.  Contacted pt by phone with instructions, verbalized understanding. Pt uses mychart for AVS.  Pt reported she has been using 3 strips every time she tests. She is requesting strips and pipettes to help her test. Advised an email will be sent to company representative.

## 2021-12-23 NOTE — Patient Instructions (Addendum)
Pre visit review using our clinic review tool, if applicable. No additional management support is needed unless otherwise documented below in the visit note.  Continue 6mg  daily except take 3mg  on Monday, Wednesdays and Saturdays. Recheck in 4 weeks, on 01/19/22.

## 2022-01-14 ENCOUNTER — Ambulatory Visit (INDEPENDENT_AMBULATORY_CARE_PROVIDER_SITE_OTHER): Payer: PRIVATE HEALTH INSURANCE

## 2022-01-14 DIAGNOSIS — Z7901 Long term (current) use of anticoagulants: Secondary | ICD-10-CM

## 2022-01-14 LAB — POCT INR: INR: 2.9 (ref 2.0–3.0)

## 2022-01-14 NOTE — Patient Instructions (Addendum)
Pre visit review using our clinic review tool, if applicable. No additional management support is needed unless otherwise documented below in the visit note.   Continue 6mg  daily except take 3mg  on Monday, Wednesdays and Saturdays. Recheck in 4 weeks, on 02/11/22.

## 2022-01-14 NOTE — Progress Notes (Signed)
Pt tests INR at home and submits results via [employer] portal. INR today is 2.9.   Continue 6mg  daily except take 3mg  on Monday, Wednesdays and Saturdays. Recheck in 4 weeks, on 02/11/22.  LVM with dosing instructions and next test date. Pt uses mychart for AVS.

## 2022-02-06 ENCOUNTER — Other Ambulatory Visit: Payer: Self-pay | Admitting: Nurse Practitioner

## 2022-02-06 DIAGNOSIS — Z7901 Long term (current) use of anticoagulants: Secondary | ICD-10-CM

## 2022-02-07 NOTE — Progress Notes (Unsigned)
After obtaining consent, and per orders of [facility], injection of Shingrix given IM in the left deltoid by Angeline Slim. Patient instructed to remain in clinic for 20 minutes afterwards, and to report any adverse reaction to me immediately.

## 2022-02-08 ENCOUNTER — Ambulatory Visit (INDEPENDENT_AMBULATORY_CARE_PROVIDER_SITE_OTHER): Payer: PRIVATE HEALTH INSURANCE

## 2022-02-08 DIAGNOSIS — Z23 Encounter for immunization: Secondary | ICD-10-CM

## 2022-02-16 ENCOUNTER — Ambulatory Visit (INDEPENDENT_AMBULATORY_CARE_PROVIDER_SITE_OTHER): Payer: PRIVATE HEALTH INSURANCE

## 2022-02-16 DIAGNOSIS — Z7901 Long term (current) use of anticoagulants: Secondary | ICD-10-CM

## 2022-02-16 LAB — POCT INR: INR: 3.3 — AB (ref 2.0–3.0)

## 2022-02-16 NOTE — Patient Instructions (Addendum)
Pre visit review using our clinic review tool, if applicable. No additional management support is needed unless otherwise documented below in the visit note.  Hold dose today and then continue 65m daily except take 364mon Monday, Wednesdays and Saturdays. Recheck in 3 weeks, on 03/09/22.

## 2022-02-16 NOTE — Progress Notes (Signed)
Pt tests INR at home and submits results via [employer] portal. INR today is 3.3.  Pt already took dose today. Reduce dose tomorrow to take 3 mg and then continue 38m daily except take 3109mon Monday, Wednesdays and Saturdays. Recheck in 3 weeks, on 03/09/22. Contacted pt and advised of dosing changes. Pt reports she was out of town this weekend and was not eating her normal leafy greens.

## 2022-03-10 ENCOUNTER — Ambulatory Visit (INDEPENDENT_AMBULATORY_CARE_PROVIDER_SITE_OTHER): Payer: PRIVATE HEALTH INSURANCE

## 2022-03-10 DIAGNOSIS — Z7901 Long term (current) use of anticoagulants: Secondary | ICD-10-CM | POA: Diagnosis not present

## 2022-03-10 LAB — POCT INR: INR: 2.9 (ref 2.0–3.0)

## 2022-03-10 NOTE — Patient Instructions (Addendum)
Pre visit review using our clinic review tool, if applicable. No additional management support is needed unless otherwise documented below in the visit note.  Continue '6mg'$  daily except take '3mg'$  on Monday, Wednesdays and Saturdays. Recheck in 4 weeks, on 04/07/22.

## 2022-03-10 NOTE — Progress Notes (Signed)
Pt tests INR at home and submits results via [employer] portal. INR today is 2.9.  Continue '6mg'$  daily except take '3mg'$  on Monday, Wednesdays and Saturdays. Recheck in 4 weeks, on 04/07/22.

## 2022-03-31 ENCOUNTER — Ambulatory Visit (INDEPENDENT_AMBULATORY_CARE_PROVIDER_SITE_OTHER): Payer: PRIVATE HEALTH INSURANCE

## 2022-03-31 DIAGNOSIS — Z7901 Long term (current) use of anticoagulants: Secondary | ICD-10-CM

## 2022-03-31 LAB — POCT INR: INR: 2.2 (ref 2.0–3.0)

## 2022-03-31 NOTE — Patient Instructions (Addendum)
Pre visit review using our clinic review tool, if applicable. No additional management support is needed unless otherwise documented below in the visit note.  Continue 6mg  daily except take 3mg  on Monday, Wednesdays and Saturdays. Recheck in 4 weeks, on 04/27/22.

## 2022-03-31 NOTE — Progress Notes (Addendum)
Pt tests INR at home and submits results via [employer] portal. INR today is 2.2.  Continue 6mg  daily except take 3mg  on Monday, Wednesdays and Saturdays. Recheck in 4 weeks, on 04/27/22. Contacted pt by phone and advised. Pt verbalized understanding.

## 2022-04-26 ENCOUNTER — Ambulatory Visit: Payer: PRIVATE HEALTH INSURANCE | Admitting: Nurse Practitioner

## 2022-04-27 ENCOUNTER — Ambulatory Visit (INDEPENDENT_AMBULATORY_CARE_PROVIDER_SITE_OTHER): Payer: PRIVATE HEALTH INSURANCE | Admitting: Nurse Practitioner

## 2022-04-27 ENCOUNTER — Encounter: Payer: Self-pay | Admitting: Nurse Practitioner

## 2022-04-27 VITALS — BP 121/61 | HR 68 | Temp 97.7°F | Resp 16 | Ht 68.0 in | Wt 171.6 lb

## 2022-04-27 DIAGNOSIS — M25552 Pain in left hip: Secondary | ICD-10-CM

## 2022-04-27 DIAGNOSIS — L237 Allergic contact dermatitis due to plants, except food: Secondary | ICD-10-CM

## 2022-04-27 DIAGNOSIS — M25551 Pain in right hip: Secondary | ICD-10-CM

## 2022-04-27 DIAGNOSIS — Z86711 Personal history of pulmonary embolism: Secondary | ICD-10-CM | POA: Diagnosis not present

## 2022-04-27 DIAGNOSIS — D6859 Other primary thrombophilia: Secondary | ICD-10-CM

## 2022-04-27 DIAGNOSIS — I484 Atypical atrial flutter: Secondary | ICD-10-CM

## 2022-04-27 MED ORDER — PREDNISONE 10 MG PO TABS
ORAL_TABLET | ORAL | 0 refills | Status: DC
Start: 2022-04-27 — End: 2022-06-22

## 2022-04-27 NOTE — Assessment & Plan Note (Signed)
Repeat CT chest 2019: resolved PE Current use of coumadin INR is therapeutic

## 2022-04-27 NOTE — Patient Instructions (Signed)
Discuss use of metformin with Dr. Talmage Nap  Have records from Dr, Talmage Nap faxed to me.  Go to [address] for x-ray.  Monitor INR closely with prednisone dose pack [place] calamine lotion or benadryl cream for itching

## 2022-04-27 NOTE — Assessment & Plan Note (Signed)
Anterior hip pain, was with weight bearing activity, Onset 1year ago, initially thought it was related to advanced OA in knees. She had bilateral arthroplasty of both knees and no improvement. Reports Hx of bilateral trochanter bursa with injections administered. Normal ROM, no trauma  Get hip x-ray, if unremarkable will sent for PT

## 2022-04-27 NOTE — Assessment & Plan Note (Signed)
Rate controlled with metoprolol Under the care of cardiology

## 2022-04-27 NOTE — Progress Notes (Signed)
Established Patient Visit  Patient: Melinda Benton   DOB: [birth year]   62 y.o. Female  MRN: [id number] Visit Date: 04/27/2022  Subjective:    Chief Complaint  Patient presents with   Rhus dermatitis    Rash    Rash on lower extremities and is spreading up    Rash This is a new problem. The current episode started in the past 7 days. The problem has been gradually worsening since onset. The affected locations include the left lower leg, left upper leg, right upper leg and right lower leg. The rash is characterized by itchiness and redness. She was exposed to plant contact. Pertinent negatives include no anorexia, congestion, cough, facial edema, fatigue, fever or sore throat. Past treatments include antihistamine. The treatment provided mild relief.   Hypercoagulable state (HCC) Current use of coumadin Managed by coumadin clinic and use of home device  History of pulmonary embolism Repeat CT chest 2019: resolved PE Current use of coumadin INR is therapeutic  Atypical atrial flutter Rate controlled with metoprolol Under the care of cardiology  Bilateral hip pain Anterior hip pain, was with weight bearing activity, Onset 1year ago, initially thought it was related to advanced OA in knees. She had bilateral arthroplasty of both knees and no improvement. Reports Hx of bilateral trochanter bursa with injections administered. Normal ROM, no trauma  Get hip x-ray, if unremarkable will sent for PT  Reviewed medical, surgical, and social history today  Medications: Outpatient Medications Prior to Visit  Medication Sig   buPROPion (WELLBUTRIN XL) 300 MG 24 hr tablet Take 1 tablet (300 mg total) by mouth daily. Need Office Visit for additional refills   ibandronate (BONIVA) 150 MG tablet Take 1 tablet (150 mg total) by mouth every 30 (thirty) days. Take in the morning with a full glass of water, on an empty stomach, and do not take anything else by mouth or lie down  for the next 30 min.   levothyroxine (SYNTHROID, LEVOTHROID) 88 MCG tablet Take 88 mcg by mouth daily before breakfast.   metoprolol succinate (TOPROL XL) 25 MG 24 hr tablet Take 1 tablet (25 mg total) by mouth daily as needed.   pantoprazole (PROTONIX) 20 MG tablet Take 1 tablet (20 mg total) by mouth daily as needed for heartburn or indigestion.   warfarin (COUMADIN) 6 MG tablet TAKE ONE TABLET BY MOUTH DAILY - EXCEPT TAKE 1/2 TABLET ON WEDNESDAYS AND SATURDAYS OR AS DIRECTED BY ANTICOAGULATION CLINIC   [DISCONTINUED] enoxaparin (LOVENOX) 120 MG/0.8ML injection INJECT 0.8 mL (120 MG) INTO THE SKIN DAILY AS DIRECTED BY ANTICOAGULATION CLINIC   No facility-administered medications prior to visit.   Reviewed past medical and social history.   ROS per HPI above      Objective:  BP 121/61 (BP Location: Left Arm, Patient Position: Sitting, Cuff Size: Large)   Pulse 68   Temp 97.7 F (36.5 C) (Temporal)   Resp 16   Ht  (1.727 m)   Wt 171 lb 9.6 oz (77.8 kg)   LMP 05/01/2012   SpO2 100%   BMI 26.09 kg/m      Physical Exam Vitals reviewed.  Cardiovascular:     Rate and Rhythm: Normal rate.     Pulses: Normal pulses.  Pulmonary:     Effort: Pulmonary effort is normal.  Skin:    Findings: Erythema and rash present.  Neurological:     Mental Status:  She is alert and oriented to person, place, and time.     No results found for any visits on 04/27/22.    Assessment & Plan:    Problem List Items Addressed This Visit       Cardiovascular and Mediastinum   Atypical atrial flutter    Rate controlled with metoprolol Under the care of cardiology        Other   Bilateral hip pain    Anterior hip pain, was with weight bearing activity, Onset 1year ago, initially thought it was related to advanced OA in knees. She had bilateral arthroplasty of both knees and no improvement. Reports Hx of bilateral trochanter bursa with injections administered. Normal ROM, no  trauma  Get hip x-ray, if unremarkable will sent for PT      Relevant Orders   DG HIPS BILAT W OR W/O PELVIS 3-4 VIEWS   History of pulmonary embolism    Repeat CT chest 2019: resolved PE Current use of coumadin INR is therapeutic      Hypercoagulable state    Current use of coumadin Managed by coumadin clinic and use of home device      Other Visit Diagnoses     Allergic contact dermatitis due to plants, except food    -  Primary   Relevant Medications   predniSONE (DELTASONE) 10 MG tablet      Return in about 4 weeks (around 05/25/2022) for CPE (fasting).     Alysia Penna, NP

## 2022-04-27 NOTE — Assessment & Plan Note (Signed)
Current use of coumadin Managed by coumadin clinic and use of home device

## 2022-04-29 ENCOUNTER — Ambulatory Visit (INDEPENDENT_AMBULATORY_CARE_PROVIDER_SITE_OTHER): Payer: PRIVATE HEALTH INSURANCE

## 2022-04-29 ENCOUNTER — Telehealth: Payer: Self-pay

## 2022-04-29 DIAGNOSIS — Z7901 Long term (current) use of anticoagulants: Secondary | ICD-10-CM

## 2022-04-29 LAB — POCT INR: INR: 2.2 (ref 2.0–3.0)

## 2022-04-29 NOTE — Patient Instructions (Addendum)
Pre visit review using our clinic review tool, if applicable. No additional management support is needed unless otherwise documented below in the visit note.  Continue 6mg  daily except take 3mg  on Monday, Wednesdays and Saturdays. Recheck in 1 weeks, on 05/05/22. Contacted pt by phone and advised. Pt verbalized understanding.

## 2022-04-29 NOTE — Progress Notes (Signed)
Pt tests INR at home and submits results via [employer] portal. INR today is 2.2.  Pt started prednisone taper 3 days ago and will take 7 more days. Advised best to retest in 1 week. Pt verbalized understanding.   Continue 6mg  daily except take 3mg  on Monday, Wednesdays and Saturdays. Recheck in 1 weeks, on 05/05/22. Contacted pt by phone and advised. Pt verbalized understanding.

## 2022-04-29 NOTE — Telephone Encounter (Signed)
Pt is overdue for INR check. LVM to check INR.

## 2022-05-06 ENCOUNTER — Ambulatory Visit (INDEPENDENT_AMBULATORY_CARE_PROVIDER_SITE_OTHER): Payer: PRIVATE HEALTH INSURANCE

## 2022-05-06 DIAGNOSIS — Z7901 Long term (current) use of anticoagulants: Secondary | ICD-10-CM | POA: Diagnosis not present

## 2022-05-06 LAB — POCT INR: INR: 1.8 — AB (ref 2.0–3.0)

## 2022-05-06 NOTE — Progress Notes (Signed)
Pt tests INR at home and submits results via [employer] portal. INR today is 1.8.  Pt has been on a prednisone taper and has now finished it. Increase dose today to take 9 mg and the continue 6mg  daily except take 3mg  on Monday, Wednesdays and Saturdays. Recheck in 3 weeks, on 05/27/22.  Contacted pt by phone and advised. Pt verbalized understanding.

## 2022-05-06 NOTE — Patient Instructions (Addendum)
Pre visit review using our clinic review tool, if applicable. No additional management support is needed unless otherwise documented below in the visit note.  Increase dose today to take 9 mg and the continue 6mg  daily except take 3mg  on Monday, Wednesdays and Saturdays. Recheck in 3 weeks, on 05/27/22.

## 2022-05-18 ENCOUNTER — Other Ambulatory Visit: Payer: Self-pay | Admitting: Nurse Practitioner

## 2022-05-18 DIAGNOSIS — Z7901 Long term (current) use of anticoagulants: Secondary | ICD-10-CM

## 2022-06-08 ENCOUNTER — Ambulatory Visit (INDEPENDENT_AMBULATORY_CARE_PROVIDER_SITE_OTHER): Payer: PRIVATE HEALTH INSURANCE

## 2022-06-08 DIAGNOSIS — Z7901 Long term (current) use of anticoagulants: Secondary | ICD-10-CM | POA: Diagnosis not present

## 2022-06-08 LAB — POCT INR: INR: 3.2 — AB (ref 2.0–3.0)

## 2022-06-08 NOTE — Progress Notes (Signed)
I have reviewed and agree with note, evaluation, plan.   Ari Bernabei, MD  

## 2022-06-08 NOTE — Patient Instructions (Addendum)
Pre visit review using our clinic review tool, if applicable. No additional management support is needed unless otherwise documented below in the visit note.  Reduce dose tomorrow to take 3 mg and then continue 6mg  daily except take 3mg  on Monday, Wednesdays and Saturdays. Recheck in 3 weeks, on 06/29/22.

## 2022-06-08 NOTE — Progress Notes (Signed)
Pt tests INR at home and submits results via [employer] portal. INR today is 3.2.  Pt already took dose today. Reduce dose tomorrow to take 3 mg and then continue 6mg  daily except take 3mg  on Monday, Wednesdays and Saturdays. Recheck in 3 weeks, on 06/29/22.  Contacted pt by phone and advised. Pt verbalized understanding.

## 2022-06-09 ENCOUNTER — Ambulatory Visit: Payer: PRIVATE HEALTH INSURANCE | Attending: Internal Medicine | Admitting: Student

## 2022-06-09 ENCOUNTER — Encounter: Payer: Self-pay | Admitting: Student

## 2022-06-09 ENCOUNTER — Telehealth: Payer: PRIVATE HEALTH INSURANCE | Admitting: Pharmacist

## 2022-06-09 ENCOUNTER — Other Ambulatory Visit (HOSPITAL_COMMUNITY): Payer: Self-pay

## 2022-06-09 VITALS — Ht 68.0 in | Wt 178.5 lb

## 2022-06-09 DIAGNOSIS — E7849 Other hyperlipidemia: Secondary | ICD-10-CM

## 2022-06-09 MED ORDER — EZETIMIBE 10 MG PO TABS: 10.0000 mg | ORAL_TABLET | Freq: Every day | ORAL | 3 refills | Status: DC

## 2022-06-09 MED ORDER — ATORVASTATIN CALCIUM 10 MG PO TABS: 10.0000 mg | ORAL_TABLET | Freq: Every day | ORAL | 3 refills | Status: DC

## 2022-06-09 NOTE — Patient Instructions (Signed)
Stop taking rosuvastatin 5 mg daily Start taking Lipitor 10 mg daily if not tolerated reduce dose to 10 mg every other day or twice a week Once you stabilize on on Atorvastatin whatever dose with out muscle pain or joint pain, start taking ezetimibe 10 mg daily

## 2022-06-09 NOTE — Assessment & Plan Note (Addendum)
Assessment:  LDL goal: <70  mg/dl last LDLc 161 mg/dl  Can not tolerate rosuvastatin due to muscle and joint pain tried 5 mg 3 times week dose still not able to tolerate  Discussed next potential options (ezetimibe,PCSK-9 inhibitors, bempedoic acid and inclisiran); cost, dosing efficacy, side effects   Plan: Will try atorvastatin low dose 10 mg daily or 10 mg every other day  Start ezetimibe 10 mg daily after trying atorvastatin for 2 weeks ( continue with max tolerated statin and Zetia) will follow up in 2 weeks via phone  Will apply for PA for PCSK9i; will inform patient upon approval  Lab: Lp(a) future lipid and LFT due in 2-3 months

## 2022-06-09 NOTE — Progress Notes (Signed)
Patient ID: Melinda Benton                 DOB: Aug 25, 1960                    MRN: [id number]      HPI: Melinda Benton is a 62 y.o. female patient referred to lipid clinic by Dr.John McComb. PMH is significant for atypical atrial flutter, hypothyroidism, protein S deficiency, hx of PE,    Patient presented today for lipid clinic. Reports she exercise regularly and follows healthy diet. After menopause she has been gaining weight.She has gained 7-8 lbs since April.  She due to myalgia / joint pain she can't not tolerate daily 5 mg rosuvastatin so she had tried every other day but her joints hurts so she has been taking 3 X week she still feels the pain reports her father had diabetes and he had his 1st MI when his early 79's and he died from MI at age of 31. After her 1st child her protein S deficiency were discovered since then she is on warfarin.     Current Medications: Crestor 5 mg 3 X week  Intolerances: Crestor 5 mg  Risk Factors: Family History of Coronary Artery Disease 10 years ASCVD risk <5%, protein S deficiency, possible familial hypercholesterolemia  LDL goal: <70    Last LDL 409, HDL 79, TC 242 TG 79 (05/19/2022)  Diet:eats healthy, however sweets are her weakness  Exercise: 5 days a week go to gym   Family History:  Relation Problem Comments  Mother (Deceased) Clotting disorder   Protein S deficiency     Father (Deceased) Diabetes   Headache   Heart disease     Brother Stroke Age??    Paternal Aunt Diabetes     Paternal Uncle Diabetes     Paternal Grandmother Colon cancer (Age: 31)     Paternal Grandfather Colon cancer     Cousin Colon cancer (Age: 19)      Social History:  Alcohol: occasional Smoking: never   Labs: Lipid Panel     Component Value Date/Time   CHOL 203 (H) 05/10/2021 0841   TRIG 134.0 05/10/2021 0841   HDL 57.60 05/10/2021 0841   CHOLHDL 4 05/10/2021 0841   VLDL 26.8 05/10/2021 0841   LDLCALC 119 (H) 05/10/2021 0841     Past Medical History:  Diagnosis Date   Arthritis    Clotting disorder (HCC)    Cough 05/19/2014   H/O blood clots    History of chicken pox    Migraines    Mitral valve prolapse    Thyroid disease     Current Outpatient Medications on File Prior to Visit  Medication Sig Dispense Refill   buPROPion (WELLBUTRIN XL) 300 MG 24 hr tablet Take 1 tablet (300 mg total) by mouth daily. Need Office Visit for additional refills 90 tablet 0   ibandronate (BONIVA) 150 MG tablet Take 1 tablet (150 mg total) by mouth every 30 (thirty) days. Take in the morning with a full glass of water, on an empty stomach, and do not take anything else by mouth or lie down for the next 30 min. 3 tablet 3   levothyroxine (SYNTHROID, LEVOTHROID) 88 MCG tablet Take 88 mcg by mouth daily before breakfast.     metoprolol succinate (TOPROL XL) 25 MG 24 hr tablet Take 1 tablet (25 mg total) by mouth daily as needed. 90 tablet 3   pantoprazole (PROTONIX) 20 MG tablet  Take 1 tablet (20 mg total) by mouth daily as needed for heartburn or indigestion. 90 tablet 0   predniSONE (DELTASONE) 10 MG tablet Take 4tabs once a day x 2days, then 3tabs once a dayx 2days, then 2tabs once a day x3days, then, 1tab once a day x 3days, then stop 23 tablet 0   warfarin (COUMADIN) 6 MG tablet TAKE 1 TABLET BY MOUTH DAILY **EXCEPT ON WEDNESDAYS AND SATURDAYS TAKE 1/2 TABLET** 66 tablet 0   No current facility-administered medications on file prior to visit.    No Known Allergies  Assessment/Plan:  1. Hyperlipidemia -  Problem  Hyperlipidemia   Hyperlipidemia Assessment:  LDL goal: <70  mg/dl last LDLc 960 mg/dl  Can not tolerate rosuvastatin due to muscle and joint pain tried 5 mg 3 times week dose still not able to tolerate  Discussed next potential options (ezetimibe,PCSK-9 inhibitors, bempedoic acid and inclisiran); cost, dosing efficacy, side effects   Plan: Will try atorvastatin low dose 10 mg daily or 10 mg every other day   Start ezetimibe 10 mg daily after trying atorvastatin for 2 weeks ( continue with max tolerated statin and Zetia) will follow up in 2 weeks via phone  Will apply for PA for PCSK9i; will inform patient upon approval  Lab: Lp(a) future lipid and LFT due in 2-3 months     Thank you,  Carmela Hurt, Pharm.D [facility] [address], [place], [place] [address]  Phone: [phone]; Fax: [phone]

## 2022-06-10 ENCOUNTER — Other Ambulatory Visit (HOSPITAL_COMMUNITY): Payer: Self-pay

## 2022-06-10 ENCOUNTER — Telehealth: Payer: Self-pay

## 2022-06-10 MED ORDER — PRALUENT 75 MG/ML ~~LOC~~ SOAJ: 75.0000 mg | SUBCUTANEOUS | 2 refills | Status: DC

## 2022-06-10 NOTE — Telephone Encounter (Signed)
    NO/PA REQ FOR PRALUENT

## 2022-06-10 NOTE — Telephone Encounter (Signed)
Patient made aware about the coverage. Will start Praluent and continue with ezetimibe. As Lipitor causes aches and pain will stop taking it.  Follow up FLP and LFT due in 3 months

## 2022-06-14 LAB — LIPOPROTEIN A (LPA): Lipoprotein (a): 9.5 nmol/L (ref <75.0)

## 2022-06-22 ENCOUNTER — Encounter: Payer: Self-pay | Admitting: Nurse Practitioner

## 2022-06-22 ENCOUNTER — Ambulatory Visit (INDEPENDENT_AMBULATORY_CARE_PROVIDER_SITE_OTHER): Payer: PRIVATE HEALTH INSURANCE | Admitting: Nurse Practitioner

## 2022-06-22 VITALS — BP 116/70 | HR 68 | Temp 98.0°F | Ht 68.0 in | Wt 180.6 lb

## 2022-06-22 DIAGNOSIS — E782 Mixed hyperlipidemia: Secondary | ICD-10-CM | POA: Diagnosis not present

## 2022-06-22 DIAGNOSIS — Z0001 Encounter for general adult medical examination with abnormal findings: Secondary | ICD-10-CM

## 2022-06-22 DIAGNOSIS — Z0000 Encounter for general adult medical examination without abnormal findings: Secondary | ICD-10-CM | POA: Diagnosis not present

## 2022-06-22 DIAGNOSIS — E039 Hypothyroidism, unspecified: Secondary | ICD-10-CM | POA: Diagnosis not present

## 2022-06-22 DIAGNOSIS — M810 Age-related osteoporosis without current pathological fracture: Secondary | ICD-10-CM | POA: Diagnosis not present

## 2022-06-22 LAB — CBC
HCT: 42.4 % (ref 36.0–46.0)
Hemoglobin: 14.2 g/dL (ref 12.0–15.0)
MCHC: 33.4 g/dL (ref 30.0–36.0)
MCV: 89.7 fl (ref 78.0–100.0)
Platelets: 214 10*3/uL (ref 150.0–400.0)
RBC: 4.73 Mil/uL (ref 3.87–5.11)
RDW: 13.1 % (ref 11.5–15.5)
WBC: 3.5 10*3/uL — ABNORMAL LOW (ref 4.0–10.5)

## 2022-06-22 LAB — COMPREHENSIVE METABOLIC PANEL
ALT: 13 U/L (ref 0–35)
AST: 18 U/L (ref 0–37)
Albumin: 4.4 g/dL (ref 3.5–5.2)
Alkaline Phosphatase: 49 U/L (ref 39–117)
BUN: 18 mg/dL (ref 6–23)
CO2: 28 mEq/L (ref 19–32)
Calcium: 9.2 mg/dL (ref 8.4–10.5)
Chloride: 104 mEq/L (ref 96–112)
Creatinine, Ser: 0.84 mg/dL (ref 0.40–1.20)
GFR: 74.67 mL/min (ref >60.00)
Glucose, Bld: 99 mg/dL (ref 70–99)
Potassium: 4 mEq/L (ref 3.5–5.1)
Sodium: 141 mEq/L (ref 135–145)
Total Bilirubin: 0.6 mg/dL (ref 0.2–1.2)
Total Protein: 7 g/dL (ref 6.0–8.3)

## 2022-06-22 LAB — LIPID PANEL
Cholesterol: 222 mg/dL — ABNORMAL HIGH (ref 0–200)
HDL: 55.9 mg/dL (ref >39.00)
LDL Cholesterol: 143 mg/dL — ABNORMAL HIGH (ref 0–99)
NonHDL: 166.55
Total CHOL/HDL Ratio: 4
Triglycerides: 117 mg/dL (ref 0.0–149.0)
VLDL: 23.4 mg/dL (ref 0.0–40.0)

## 2022-06-22 NOTE — Assessment & Plan Note (Signed)
Under the care of Dr. Talmage Nap Current use of levothyroixine Repeat thyroid panel. Will fax results once completed

## 2022-06-22 NOTE — Assessment & Plan Note (Signed)
Repeat lipid panel Under care of cardiology Currently takes only zetia. Unable to tolerate low dose atorvastatin Misunderstanding about when to start repatha. Pharmacist notified.

## 2022-06-22 NOTE — Progress Notes (Signed)
Complete physical exam  Patient: Melinda Benton   DOB: 03-25-1960   62 y.o. Female  MRN: [id number] Visit Date: 06/22/2022  Subjective:    Chief Complaint  Patient presents with   Annual Exam   Melinda Benton is a 62 y.o. female who presents today for a complete physical exam. She reports consuming a general diet.  Cardio and weight training 2-3x/week  She generally feels well. She reports sleeping well. She does not have additional problems to discuss today.  Vision:Yes Dental:Yes STD Screen:No  Request records from GYN: Dr. Arelia Sneddon (mammogram, dexa scan and PAP)  BP Readings from Last 3 Encounters:  06/22/22 116/70  04/27/22 121/61  05/14/21 118/68   Wt Readings from Last 3 Encounters:  06/22/22 180 lb 9.6 oz (81.9 kg)  06/09/22 178 lb 8 oz (81 kg)  04/27/22 171 lb 9.6 oz (77.8 kg)   Most recent fall risk assessment:    04/27/2022    9:04 AM  Fall Risk   Falls in the past year? 0  Number falls in past yr: 0  Injury with Fall? 0  Risk for fall due to : No Fall Risks   Depression screen:Yes - No Depression  Most recent depression screenings:    06/22/2022    8:25 AM 04/27/2022    9:04 AM  PHQ 2/9 Scores  PHQ - 2 Score 0 0  PHQ- 9 Score 0    HPI  Hyperlipidemia Repeat lipid panel Under care of cardiology Currently takes only zetia. Unable to tolerate low dose atorvastatin Misunderstanding about when to start repatha. Pharmacist notified.  Hypothyroidism Under the care of Dr. Talmage Nap Current use of levothyroixine Repeat thyroid panel. Will fax results once completed  Age-related osteoporosis without current pathological fracture dexa scan completed by Dr. Arelia Sneddon: report requested We discussed use of boniva once in combination with calcium 600mg  and vit. D 1000IU daily.  Past Medical History:  Diagnosis Date   Arthritis    Clotting disorder (HCC)    Cough 05/19/2014   H/O blood clots    History of chicken pox    Migraines    Mitral valve  prolapse    Thyroid disease    Past Surgical History:  Procedure Laterality Date   bladder sur     CESAREAN SECTION     2   CHOLECYSTECTOMY N/A 07/07/2019   Procedure: LAPAROSCOPIC CHOLECYSTECTOMY WITH INTRAOPERATIVE CHOLANGIOGRAM;  Surgeon: Griselda Miner, MD;  Location: [facility];  Service: General;  Laterality: N/A;   ELBOW SURGERY  2009   knee repla Left 07/2016   partial knee replacement    ROTATOR CUFF REPAIR  2011   TONSILLECTOMY  ?1968   Social History   Socioeconomic History   Marital status: Married    Spouse name: Not on file   Number of children: 3   Years of education: 16   Highest education level: Not on file  Occupational History   Occupation: Technical brewer  Tobacco Use   Smoking status: Never   Smokeless tobacco: Never  Vaping Use   Vaping Use: Never used  Substance and Sexual Activity   Alcohol use: Yes    Comment: Socially   Drug use: No   Sexual activity: Yes  Other Topics Concern   Not on file  Social History Narrative   Regular exercise-yes   Caffeine Use-yes   Social Determinants of Health   Financial Resource Strain: Not on file  Food Insecurity: Not on file  Transportation Needs:  Not on file  Physical Activity: Not on file  Stress: Not on file  Social Connections: Not on file  Intimate Partner Violence: Not on file   Family Status  Relation Name Status   Mother  Deceased   Father  Deceased   PGF  (Not Specified)   Brother  (Not Specified)   Emelda Brothers  (Not Specified)   Oneal Grout  (Not Specified)   PGM  (Not Specified)   Cousin  (Not Specified)   Family History  Problem Relation Age of Onset   Protein S deficiency Mother    Clotting disorder Mother    Heart disease Father    Diabetes Father    Headache Father    Colon cancer Paternal Grandfather    Stroke Brother    Diabetes Paternal Aunt    Diabetes Paternal Uncle    Colon cancer Paternal Grandmother 39   Colon cancer Cousin 35       paternal   No Known Allergies   Patient Care Team: Yehonatan Grandison, Bonna Gains, NP as PCP - General (Internal Medicine) Marinus Maw, MD as PCP - Cardiology (Cardiology) Marinus Maw, MD as PCP - Electrophysiology (Cardiology) Dorisann Frames, MD as Consulting Physician (Endocrinology)   Medications: Outpatient Medications Prior to Visit  Medication Sig   Alirocumab (PRALUENT) 75 MG/ML SOAJ Inject 1 mL (75 mg total) into the skin every 14 (fourteen) days.   buPROPion (WELLBUTRIN XL) 300 MG 24 hr tablet Take 1 tablet (300 mg total) by mouth daily. Need Office Visit for additional refills   ezetimibe (ZETIA) 10 MG tablet Take 1 tablet (10 mg total) by mouth daily.   ibandronate (BONIVA) 150 MG tablet Take 1 tablet (150 mg total) by mouth every 30 (thirty) days. Take in the morning with a full glass of water, on an empty stomach, and do not take anything else by mouth or lie down for the next 30 min.   levothyroxine (SYNTHROID, LEVOTHROID) 88 MCG tablet Take 88 mcg by mouth daily before breakfast.   metoprolol succinate (TOPROL XL) 25 MG 24 hr tablet Take 1 tablet (25 mg total) by mouth daily as needed.   pantoprazole (PROTONIX) 20 MG tablet Take 1 tablet (20 mg total) by mouth daily as needed for heartburn or indigestion.   warfarin (COUMADIN) 6 MG tablet TAKE 1 TABLET BY MOUTH DAILY **EXCEPT ON WEDNESDAYS AND SATURDAYS TAKE 1/2 TABLET**   [DISCONTINUED] atorvastatin (LIPITOR) 10 MG tablet Take 1 tablet (10 mg total) by mouth daily.   [DISCONTINUED] predniSONE (DELTASONE) 10 MG tablet Take 4tabs once a day x 2days, then 3tabs once a dayx 2days, then 2tabs once a day x3days, then, 1tab once a day x 3days, then stop   No facility-administered medications prior to visit.    Review of Systems  Constitutional:  Negative for activity change, appetite change and unexpected weight change.  Respiratory: Negative.    Cardiovascular: Negative.   Gastrointestinal: Negative.   Endocrine: Negative for cold intolerance and heat  intolerance.  Genitourinary: Negative.   Musculoskeletal: Negative.   Skin: Negative.   Neurological: Negative.   Hematological: Negative.   Psychiatric/Behavioral:  Negative for behavioral problems, decreased concentration, dysphoric mood, hallucinations, self-injury, sleep disturbance and suicidal ideas. The patient is not nervous/anxious.         Objective:  BP 116/70 (BP Location: Left Arm, Patient Position: Sitting, Cuff Size: Normal)   Pulse 68   Temp 98 F (36.7 C) (Oral)   Ht 5\' 8"  (1.727 m)  Wt 180 lb 9.6 oz (81.9 kg)   LMP 05/01/2012   SpO2 98%   BMI 27.46 kg/m     Physical Exam Vitals and nursing note reviewed.  Constitutional:      General: She is not in acute distress. HENT:     Right Ear: Tympanic membrane, ear canal and external ear normal.     Left Ear: Tympanic membrane, ear canal and external ear normal.     Nose: Nose normal.  Eyes:     Extraocular Movements: Extraocular movements intact.     Conjunctiva/sclera: Conjunctivae normal.     Pupils: Pupils are equal, round, and reactive to light.  Neck:     Thyroid: No thyroid mass, thyromegaly or thyroid tenderness.  Cardiovascular:     Rate and Rhythm: Normal rate and regular rhythm.     Pulses: Normal pulses.     Heart sounds: Normal heart sounds.  Pulmonary:     Effort: Pulmonary effort is normal.     Breath sounds: Normal breath sounds.  Abdominal:     General: Bowel sounds are normal.     Palpations: Abdomen is soft.  Genitourinary:    Comments: Deferred breast and pelvic exam to GYN Musculoskeletal:        General: Normal range of motion.     Cervical back: Normal range of motion and neck supple.     Right lower leg: No edema.     Left lower leg: No edema.  Lymphadenopathy:     Cervical: No cervical adenopathy.  Skin:    General: Skin is warm and dry.  Neurological:     Mental Status: She is alert and oriented to person, place, and time.     Cranial Nerves: No cranial nerve deficit.   Psychiatric:        Mood and Affect: Mood normal.        Behavior: Behavior normal.        Thought Content: Thought content normal.     No results found for any visits on 06/22/22.    Assessment & Plan:    Routine Health Maintenance and Physical Exam  Immunization History  Administered Date(s) Administered   Influenza,inj,Quad PF,6+ Mos 10/09/2013, 10/15/2018   Influenza-Unspecified 10/03/2017   Moderna Sars-Covid-2 Vaccination 03/20/2019, 04/17/2019, 12/24/2019   Tdap 01/03/2009, 05/07/2021   Zoster Recombinat (Shingrix) 09/01/2021, 02/08/2022   Health Maintenance  Topic Date Due   MAMMOGRAM  11/06/2020   COVID-19 Vaccine (4 - 2023-24 season) 09/03/2021   PAP SMEAR-Modifier  04/24/2022   INFLUENZA VACCINE  08/04/2022   Colonoscopy  01/04/2023   DTaP/Tdap/Td (3 - Td or Tdap) 05/08/2031   Hepatitis C Screening  Completed   HIV Screening  Completed   Zoster Vaccines- Shingrix  Completed   HPV VACCINES  Aged Out   Discussed health benefits of physical activity, and encouraged her to engage in regular exercise appropriate for her age and condition.  Problem List Items Addressed This Visit       Endocrine   Hypothyroidism    Under the care of Dr. Talmage Nap Current use of levothyroixine Repeat thyroid panel. Will fax results once completed      Relevant Orders   Thyroid Panel With TSH     Musculoskeletal and Integument   Age-related osteoporosis without current pathological fracture    dexa scan completed by Dr. Arelia Sneddon: report requested We discussed use of boniva once in combination with calcium 600mg  and vit. D 1000IU daily.  Other   Hyperlipidemia    Repeat lipid panel Under care of cardiology Currently takes only zetia. Unable to tolerate low dose atorvastatin Misunderstanding about when to start repatha. Pharmacist notified.      Relevant Orders   Lipid panel   Other Visit Diagnoses     Encounter for preventative adult health care exam with  abnormal findings    -  Primary   Relevant Orders   Comprehensive metabolic panel   CBC      Return in about 1 year (around 06/22/2023) for CPE (fasting).     Alysia Penna, NP

## 2022-06-22 NOTE — Assessment & Plan Note (Signed)
dexa scan completed by Dr. Arelia Sneddon: report requested We discussed use of boniva once in combination with calcium 600mg  and vit. D 1000IU daily.

## 2022-06-22 NOTE — Patient Instructions (Addendum)
Go to lab Continue Heart healthy diet and daily exercise. Maintain current medications. Sign medical release to get records from GYN  Go to [address] for x-ray

## 2022-06-23 ENCOUNTER — Telehealth: Payer: Self-pay | Admitting: Pharmacist

## 2022-06-23 LAB — THYROID PANEL WITH TSH
Free Thyroxine Index: 2.3 (ref 1.4–3.8)
T3 Uptake: 31 % (ref 22–35)
T4, Total: 7.4 ug/dL (ref 5.1–11.9)
TSH: 0.2 mIU/L — ABNORMAL LOW (ref 0.40–4.50)

## 2022-06-23 NOTE — Telephone Encounter (Signed)
Discussed recent LDLc level. Advised her to be on Praluent 75 mg q14d and ezetimibe 10 mg daily. Will repeat FLP and LFT in 2 months.   In future if LDLc remains elevated may consider Praluent dose increment from 75 mg to 150 mg every 14 days

## 2022-06-23 NOTE — Telephone Encounter (Signed)
-----   Message from Melinda Ng, NP sent at 06/22/2022  2:33 PM EDT ----- Melinda Benton, thank you  ----- Message ----- From: Tylene Fantasia, [facility] Sent: 06/22/2022   1:24 PM EDT To: Melinda Ng, NP  Her lipid lab is drawn today will decided from LDLc level. I will reach out to her tomorrow once I have result. Thank you.  ----- Message ----- From: Melinda Ng, NP Sent: 06/22/2022   9:07 AM EDT To: Tylene Fantasia, RPH  Good Morning, Please contact [name] and clarify if she is suppose to be on zetia and praluent or not.  Thank you Llana Aliment, NP

## 2022-06-24 ENCOUNTER — Encounter: Payer: Self-pay | Admitting: Nurse Practitioner

## 2022-06-30 ENCOUNTER — Ambulatory Visit (INDEPENDENT_AMBULATORY_CARE_PROVIDER_SITE_OTHER): Payer: PRIVATE HEALTH INSURANCE

## 2022-06-30 DIAGNOSIS — Z7901 Long term (current) use of anticoagulants: Secondary | ICD-10-CM

## 2022-06-30 LAB — POCT INR: INR: 3 (ref 2.0–3.0)

## 2022-06-30 NOTE — Progress Notes (Signed)
Pt tests INR at home and submits results via [employer] portal. INR today is 3.0.  Continue 6mg  daily except take 3mg  on Monday, Wednesdays and Saturdays. LVM with detailed dosing instructions and retest date.

## 2022-06-30 NOTE — Patient Instructions (Signed)
Pre visit review using our clinic review tool, if applicable. No additional management support is needed unless otherwise documented below in the visit note. 

## 2022-07-27 ENCOUNTER — Ambulatory Visit (INDEPENDENT_AMBULATORY_CARE_PROVIDER_SITE_OTHER): Payer: PRIVATE HEALTH INSURANCE

## 2022-07-27 DIAGNOSIS — Z7901 Long term (current) use of anticoagulants: Secondary | ICD-10-CM

## 2022-07-27 LAB — POCT INR: INR: 2.9 (ref 2.0–3.0)

## 2022-07-27 NOTE — Progress Notes (Signed)
Pt tests INR at home and submits results via [employer] portal. INR today is 2.9. Continue 6mg  daily except take 3mg  on Monday, Wednesdays and Saturdays. Recheck in 4 weeks, on 08/24/22.  LVM with detailed dosing instructions and retest date.

## 2022-07-27 NOTE — Progress Notes (Signed)
I have reviewed and agree with note, evaluation, plan.   Stephen Hunter, MD  

## 2022-07-27 NOTE — Patient Instructions (Addendum)
Pre visit review using our clinic review tool, if applicable. No additional management support is needed unless otherwise documented below in the visit note.  Continue 6mg  daily except take 3mg  on Monday, Wednesdays and Saturdays. Recheck in 4 weeks, on 08/24/22.

## 2022-08-20 ENCOUNTER — Other Ambulatory Visit: Payer: Self-pay | Admitting: Nurse Practitioner

## 2022-08-20 DIAGNOSIS — Z7901 Long term (current) use of anticoagulants: Secondary | ICD-10-CM

## 2022-08-24 LAB — POCT INR: INR: 2.3 (ref 2.0–3.0)

## 2022-08-25 ENCOUNTER — Ambulatory Visit (INDEPENDENT_AMBULATORY_CARE_PROVIDER_SITE_OTHER): Payer: PRIVATE HEALTH INSURANCE

## 2022-08-25 DIAGNOSIS — Z7901 Long term (current) use of anticoagulants: Secondary | ICD-10-CM | POA: Diagnosis not present

## 2022-08-25 NOTE — Progress Notes (Signed)
Pt tests INR at home and submits results via [employer] portal. Pt submitted result last night and it is 2.3. Continue 6mg  daily except take 3mg  on Monday, Wednesdays and Saturdays. Recheck in 4 weeks, on 09/22/22.  LVM with detailed dosing instructions and retest date.

## 2022-08-25 NOTE — Patient Instructions (Addendum)
Pre visit review using our clinic review tool, if applicable. No additional management support is needed unless otherwise documented below in the visit note.  Continue 6mg  daily except take 3mg  on Monday, Wednesdays and Saturdays. Recheck in 4 weeks, on 09/22/22.

## 2022-08-30 ENCOUNTER — Ambulatory Visit: Payer: PRIVATE HEALTH INSURANCE

## 2022-08-31 IMAGING — DX DG FOOT COMPLETE 3+V*L*
3 series · 3 of 3 positions shown · non-contrast
Comparison: 12/02/2019

CLINICAL DATA: Follow-up fifth proximal phalangeal fracture

EXAM:
LEFT FOOT - COMPLETE 3+ VIEW

[foot ap]
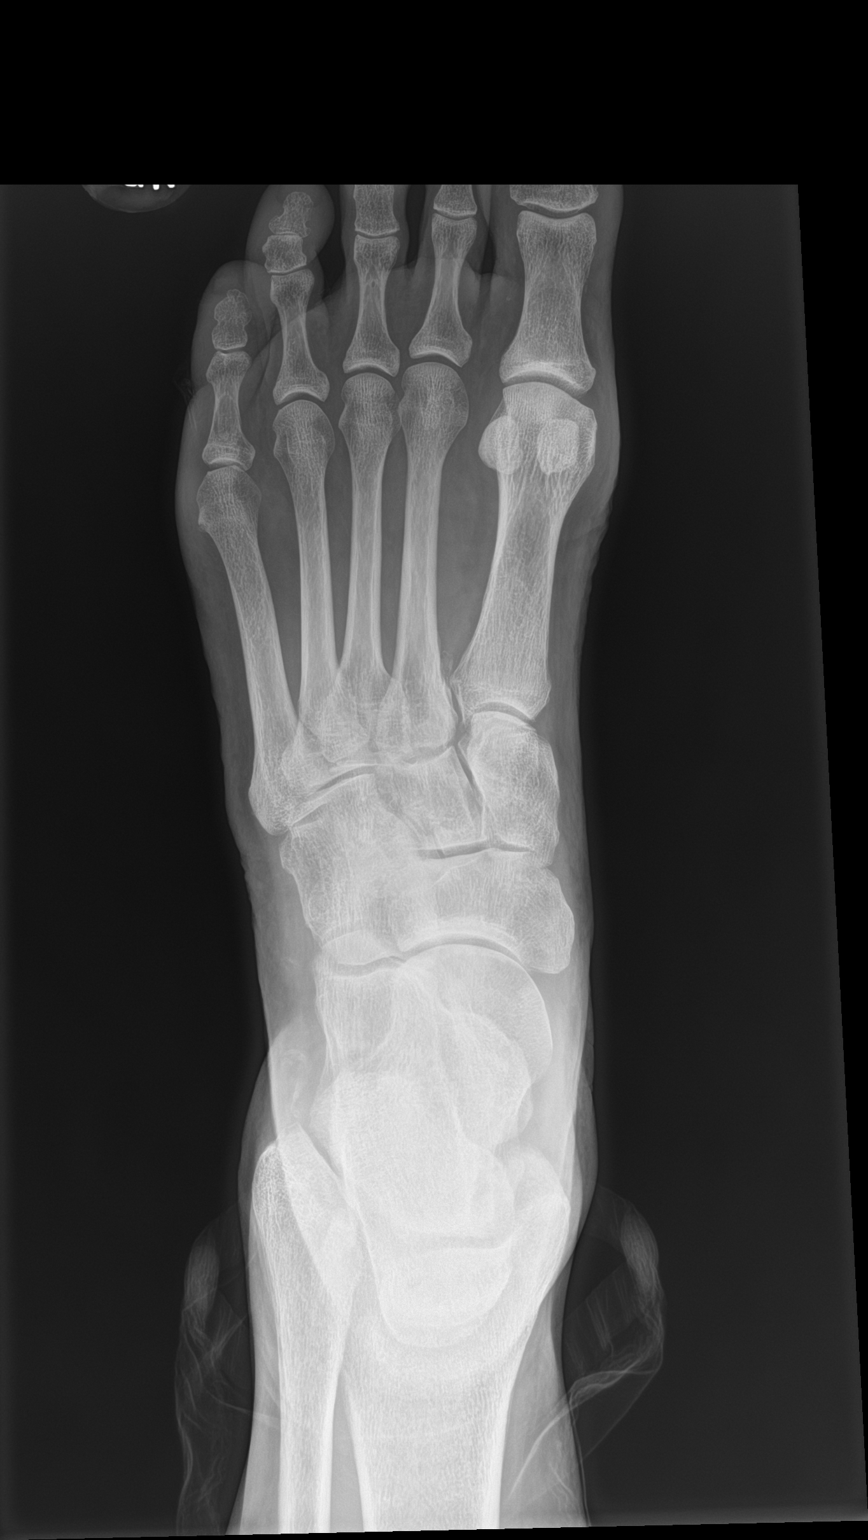

[foot obl]
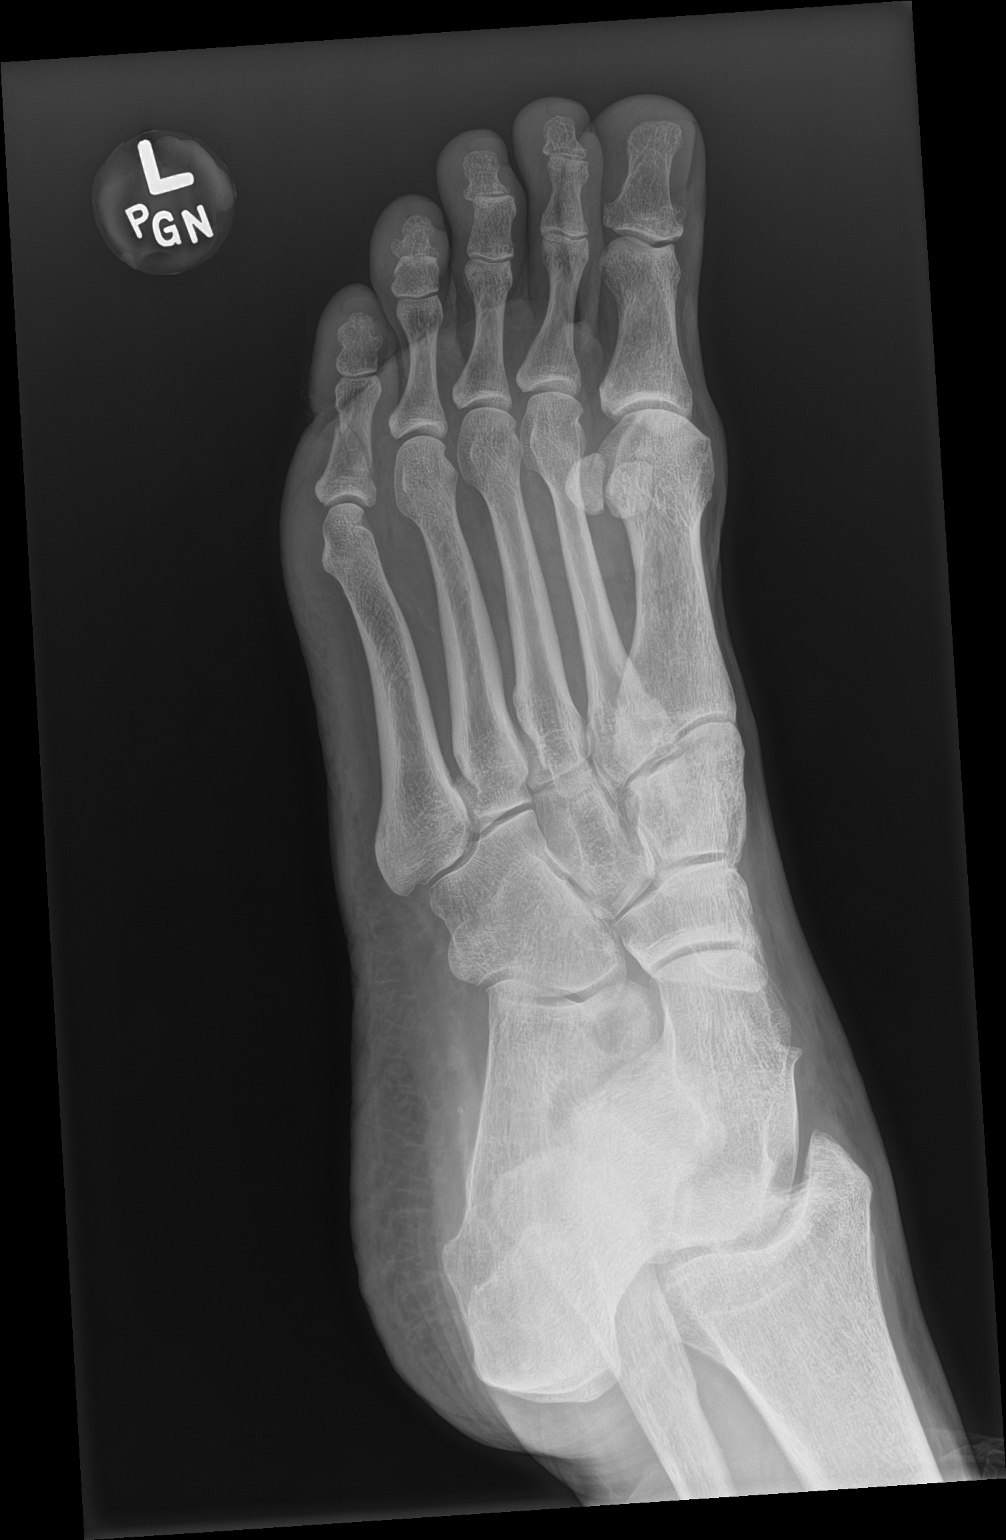

[foot lat]
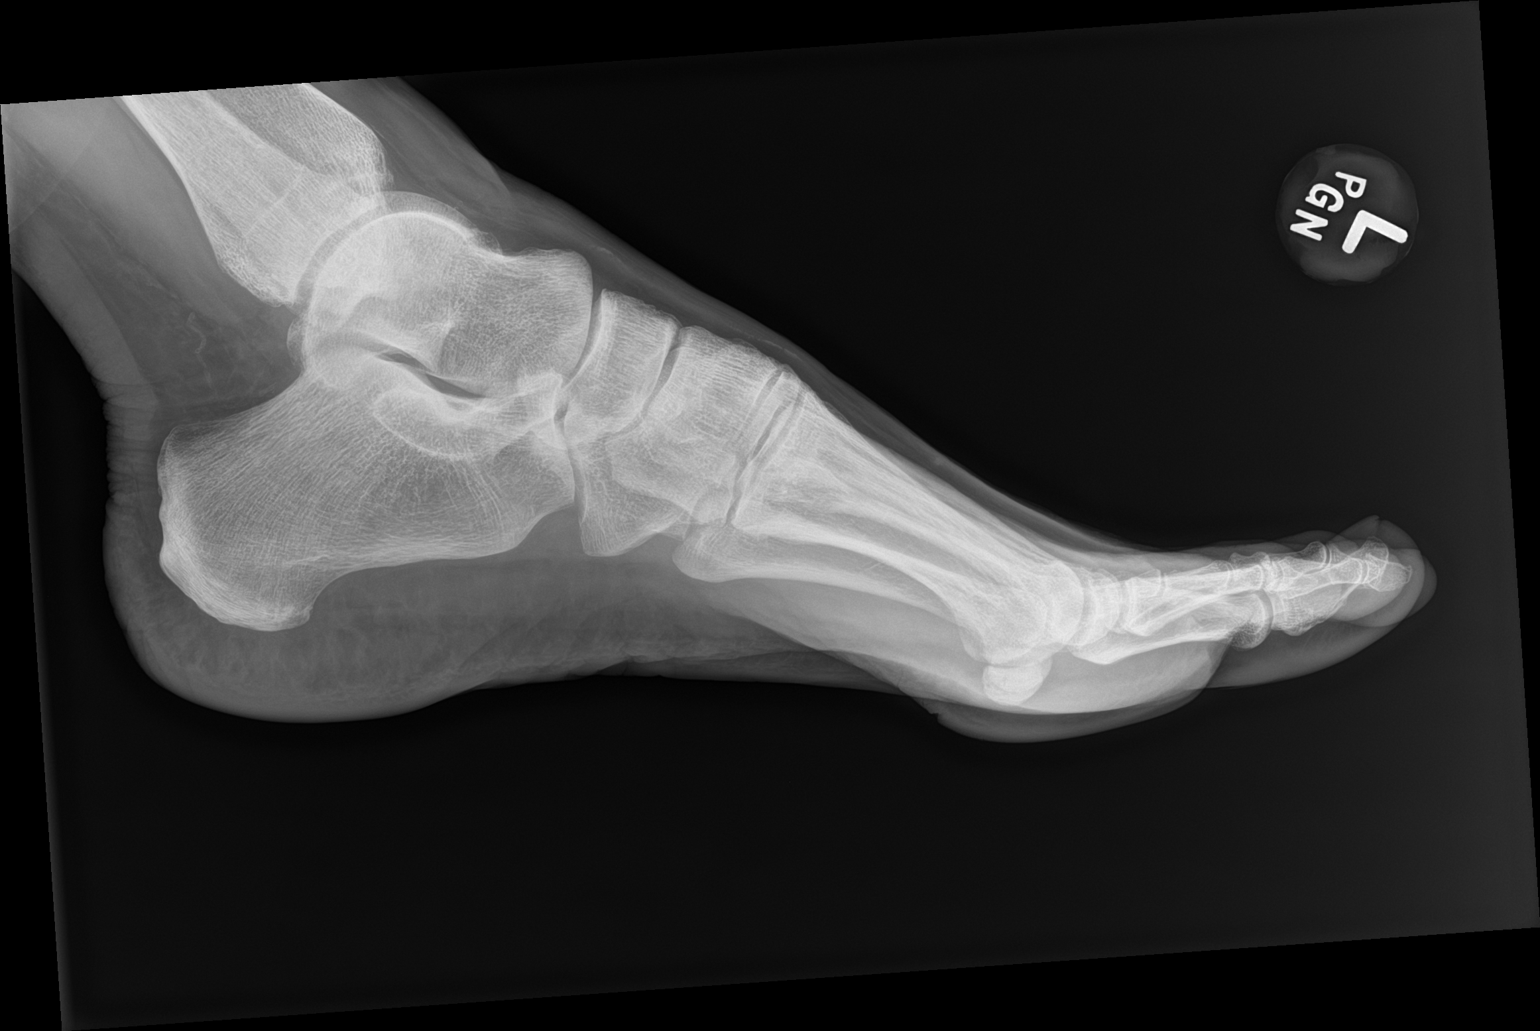

[3 of 3 positions shown; findings below may reference images not displayed]

FINDINGS: Proximal fifth phalangeal fracture is again identified. No
significant callus formation is noted. No new fracture is noted. No
soft tissue abnormality is seen.
IMPRESSION: Stable appearing fifth proximal phalangeal fracture.

## 2022-09-14 ENCOUNTER — Telehealth: Payer: Self-pay | Admitting: Pharmacist

## 2022-09-14 ENCOUNTER — Ambulatory Visit: Payer: PRIVATE HEALTH INSURANCE | Attending: Internal Medicine

## 2022-09-14 DIAGNOSIS — E7849 Other hyperlipidemia: Secondary | ICD-10-CM

## 2022-09-14 LAB — HEPATIC FUNCTION PANEL
ALT: 11 IU/L (ref 0–32)
AST: 17 IU/L (ref 0–40)
Albumin: 4.6 g/dL (ref 3.9–4.9)
Alkaline Phosphatase: 55 IU/L (ref 44–121)
Bilirubin Total: 0.5 mg/dL (ref 0.0–1.2)
Bilirubin, Direct: 0.13 mg/dL (ref 0.00–0.40)
Total Protein: 6.6 g/dL (ref 6.0–8.5)

## 2022-09-14 LAB — LIPID PANEL
Chol/HDL Ratio: 2.6 ratio (ref 0.0–4.4)
Cholesterol, Total: 172 mg/dL (ref 100–199)
HDL: 65 mg/dL (ref >39)
LDL Chol Calc (NIH): 89 mg/dL (ref 0–99)
Triglycerides: 101 mg/dL (ref 0–149)
VLDL Cholesterol Cal: 18 mg/dL (ref 5–40)

## 2022-09-14 MED ORDER — PRALUENT 75 MG/ML ~~LOC~~ SOAJ: 75.0000 mg | SUBCUTANEOUS | 3 refills | Status: DC

## 2022-09-14 NOTE — Telephone Encounter (Signed)
F/U lab reminder - Praluent started 06/23/2022.

## 2022-09-15 MED ORDER — PRALUENT 150 MG/ML ~~LOC~~ SOAJ: 150.0000 mg | SUBCUTANEOUS | 2 refills | Status: DC

## 2022-09-15 NOTE — Addendum Note (Signed)
Addended by: Tylene Fantasia on: 09/15/2022 10:25 AM   Modules accepted: Orders

## 2022-09-23 ENCOUNTER — Other Ambulatory Visit: Payer: Self-pay | Admitting: Nurse Practitioner

## 2022-09-23 ENCOUNTER — Telehealth: Payer: Self-pay

## 2022-09-23 DIAGNOSIS — Z7901 Long term (current) use of anticoagulants: Secondary | ICD-10-CM

## 2022-09-23 NOTE — Telephone Encounter (Signed)
Chart supports rx. Last OV: 06/22/2022

## 2022-09-23 NOTE — Telephone Encounter (Signed)
Time for pt to test INR. Contacted pt and advised. She reported she has been out of town but will have her daughter bring her machine when she meets her tonight. She reports she will test later today, tonight and report INR result to [employer].

## 2022-09-25 LAB — POCT INR: INR: 1.2 — AB (ref 2.0–3.0)

## 2022-09-26 ENCOUNTER — Ambulatory Visit (INDEPENDENT_AMBULATORY_CARE_PROVIDER_SITE_OTHER): Payer: PRIVATE HEALTH INSURANCE

## 2022-09-26 DIAGNOSIS — Z7901 Long term (current) use of anticoagulants: Secondary | ICD-10-CM | POA: Diagnosis not present

## 2022-09-26 NOTE — Progress Notes (Signed)
Pt tests INR at home and submits results via [employer] portal. Pt submitted result last night and it is 1.2. Pt reports having gained weight this summer, 20 lbs, along with alopecia. She learned her levothyroxine was not a large enough dose so she was prescribed a higher dose. She was retested recently and was told the lab was too high so she was reduced to her prior dose on 9/14. She also reports new medications of Ozempic and Praluent. These do not interact with warfarin. Pt reports changes to diet include reduced intake due to Ozempic. Denies eating more vitamin K containing foods. Reports she could have missed a dose on 9/18, but she is not sure. Denies any other changes.  Increase dose today to take 1 tablet and increase dose tomorrow to take 1 1/ tablets and then change weekly dosing to take  6mg  daily except take 3mg  on Saturdays. Recheck in 1 weeks, on 10/03/22.  Advised pt of dosing instructions and recheck date. Pt verbalized understanding.

## 2022-09-26 NOTE — Patient Instructions (Addendum)
Pre visit review using our clinic review tool, if applicable. No additional management support is needed unless otherwise documented below in the visit note.  Increase dose today to take 1 tablet and increase dose tomorrow to take 1 1/ tablets and then change weekly dosing to take  6mg  daily except take 3mg  on Saturdays. Recheck in 1 weeks, on 10/03/22.

## 2022-10-03 ENCOUNTER — Ambulatory Visit (INDEPENDENT_AMBULATORY_CARE_PROVIDER_SITE_OTHER): Payer: PRIVATE HEALTH INSURANCE

## 2022-10-03 DIAGNOSIS — Z7901 Long term (current) use of anticoagulants: Secondary | ICD-10-CM

## 2022-10-03 LAB — POCT INR: INR: 2 (ref 2.0–3.0)

## 2022-10-03 NOTE — Progress Notes (Signed)
Pt tests INR at home and submits results via [employer] portal. Pt submitted result last night and it is 2.0. Continue 6mg  daily except take 3mg  on Saturdays. Recheck in 2 weeks, on 10/18/22.  Advised pt of dosing instructions and recheck date. Pt verbalized understanding.

## 2022-10-03 NOTE — Patient Instructions (Addendum)
Pre visit review using our clinic review tool, if applicable. No additional management support is needed unless otherwise documented below in the visit note.  Continue 6mg  daily except take 3mg  on Saturdays. Recheck in 2 weeks, on 10/18/22.

## 2022-10-17 ENCOUNTER — Ambulatory Visit (INDEPENDENT_AMBULATORY_CARE_PROVIDER_SITE_OTHER): Payer: PRIVATE HEALTH INSURANCE

## 2022-10-17 DIAGNOSIS — Z7901 Long term (current) use of anticoagulants: Secondary | ICD-10-CM

## 2022-10-17 LAB — POCT INR: INR: 4 — AB (ref 2.0–3.0)

## 2022-10-17 NOTE — Progress Notes (Signed)
Pt tests INR at home and submits results via [employer] portal. Pt submitted result last night and it is 4.0. Pt reports the last week she was in [place] and did not eat any greens. Pt already took warfarin today. Hold dose tomorrow and then change weekly dose to take 6mg  daily except take 3mg  on Wednesdays and Saturdays. Recheck in 2 weeks, on 10/31/22.  Advised pt of dosing instructions and recheck date. Pt verbalized understanding.

## 2022-10-17 NOTE — Patient Instructions (Signed)
Pre visit review using our clinic review tool, if applicable. No additional management support is needed unless otherwise documented below in the visit note.

## 2022-10-31 ENCOUNTER — Ambulatory Visit (INDEPENDENT_AMBULATORY_CARE_PROVIDER_SITE_OTHER): Payer: Self-pay

## 2022-10-31 DIAGNOSIS — Z7901 Long term (current) use of anticoagulants: Secondary | ICD-10-CM | POA: Diagnosis not present

## 2022-10-31 LAB — POCT INR: INR: 2.8 (ref 2.0–3.0)

## 2022-10-31 NOTE — Progress Notes (Signed)
Pt tests INR at home and submits results via [employer] portal. Pt submitted result last night and it is 2.8. Continue 6mg  daily except take 3mg  on Wednesdays and Saturdays. Recheck in 2 weeks, on 11/28/22.  LVM with dosing instructions and recheck date.

## 2022-10-31 NOTE — Patient Instructions (Addendum)
Pre visit review using our clinic review tool, if applicable. No additional management support is needed unless otherwise documented below in the visit note.  Continue 6mg  daily except take 3mg  on Wednesdays and Saturdays. Recheck in 2 weeks, on 11/28/22.

## 2022-11-04 NOTE — Progress Notes (Unsigned)
Cardiology Office Note Date:  11/04/2022  Patient ID:  Melinda Benton, Melinda Benton 12/15/1960, MRN [id number] PCP:  Anne Ng, NP  Cardiologist/Electrophysiologist: Dr. Ladona Ridgel     Chief Complaint:  *** annual visit    History of Present Illness: NAVADA Benton is a 62 y.o. 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 Ladona Ridgel 11/2019, occ palpitations, no symptoms otherwise Discussed anxiety around PRN dosing, offered to change to daily toprol though she preferred to continue unchanged.  She saw A. Tillery, PA-C, she was feeling well, no reports of symptoms No changes made  *** warfrain, who manages, bleeding *** symptoms     Past Medical History:  Diagnosis Date   Arthritis    Clotting disorder (HCC)    Cough 05/19/2014   H/O blood clots    History of chicken pox    Migraines    Mitral valve prolapse    Thyroid disease     Past Surgical History:  Procedure Laterality Date   bladder sur     CESAREAN SECTION     2   CHOLECYSTECTOMY N/A 07/07/2019   Procedure: LAPAROSCOPIC CHOLECYSTECTOMY WITH INTRAOPERATIVE CHOLANGIOGRAM;  Surgeon: Griselda Miner, MD;  Location: [facility];  Service: General;  Laterality: N/A;   ELBOW SURGERY  2009   knee repla Left 07/2016   partial knee replacement    ROTATOR CUFF REPAIR  2011   TONSILLECTOMY  ?1968    Current Outpatient Medications  Medication Sig Dispense Refill   Alirocumab (PRALUENT) 150 MG/ML SOAJ Inject 1 mL (150 mg total) into the skin every 14 (fourteen) days. 2 mL 2   buPROPion (WELLBUTRIN XL) 300 MG 24 hr tablet Take 1 tablet (300 mg total) by mouth daily. Need Office Visit for additional refills 90 tablet 0   ezetimibe (ZETIA) 10 MG tablet Take 1 tablet (10 mg total) by mouth daily. 90 tablet 3   ibandronate (BONIVA) 150 MG tablet Take 1 tablet  (150 mg total) by mouth every 30 (thirty) days. Take in the morning with a full glass of water, on an empty stomach, and do not take anything else by mouth or lie down for the next 30 min. 3 tablet 3   levothyroxine (SYNTHROID, LEVOTHROID) 88 MCG tablet Take 88 mcg by mouth daily before breakfast.     metoprolol succinate (TOPROL XL) 25 MG 24 hr tablet Take 1 tablet (25 mg total) by mouth daily as needed. 90 tablet 3   pantoprazole (PROTONIX) 20 MG tablet Take 1 tablet (20 mg total) by mouth daily as needed for heartburn or indigestion. 90 tablet 0   warfarin (COUMADIN) 6 MG tablet TAKE 1 TABLEY BY MOUTH DAILY **EXCEPT ON WEDNESDAYS AND SATURDAYS TAKE 1/2 TABLET 52 tablet 0   No current facility-administered medications for this visit.    Allergies:   Patient has no known allergies.   Social History:  The patient  reports that she has never smoked. She has never used smokeless tobacco. She reports current alcohol use. She reports that she does not use drugs.   Family History:  The patient's family history includes Clotting disorder in her mother; Colon cancer in her paternal grandfather; Colon cancer (age of onset: 72) in her cousin; Colon cancer (age of onset: 51) in her paternal grandmother; Diabetes in her father, paternal aunt, and  paternal uncle; Headache in her father; Heart disease in her father; Protein S deficiency in her mother; Stroke in her brother.  ROS:  Please see the history of present illness.    All other systems are reviewed and otherwise negative.   PHYSICAL EXAM:  VS:  LMP 05/01/2012  BMI: There is no height or weight on file to calculate BMI. Well nourished, well developed, in no acute distress HEENT: normocephalic, atraumatic Neck: no JVD, carotid bruits or masses Cardiac: *** RRR; no significant murmurs, no rubs, or gallops Lungs: *** CTA b/l, no wheezing, rhonchi or rales Abd: soft, nontender MS: no deformity or atrophy Ext: *** no edema, no skin changes, no calf  tenderness of palpable masses Skin: warm and dry, no rash Neuro:  No gross deficits appreciated Psych: euthymic mood, full affect     EKG:  Done today and reviewed by myself shows  ***  01/18/2017; TTE Study Conclusions  - Left ventricle: The cavity size was normal. Wall thickness was    normal. Systolic function was normal. The estimated ejection    fraction was in the range of 60% to 65%. Wall motion was normal;    there were no regional wall motion abnormalities. Doppler    parameters are consistent with abnormal left ventricular    relaxation (grade 1 diastolic dysfunction). The E/e&' ratio is    between 8-15, suggesting indeterminate LV filling pressure.  - Left atrium: The atrium was normal in size.  - Right atrium: The atrium was normal in size.  - Tricuspid valve: There was trivial regurgitation.  - Pulmonary arteries: PA peak pressure: 19 mm Hg (S).  - Inferior vena cava: The vessel was normal in size. The    respirophasic diameter changes were in the normal range (= 50%),    consistent with normal central venous pressure.  - Pericardium, extracardiac: A trivial pericardial effusion was    identified posterior to the heart. Features were not consistent    with tamponade physiology.   Impressions:  - Compared to a prior study in 2015, there are few changes. LVEF is    60-65% with a persistent trivial posterior pericardial effusion.    11/2013: 48hour monitor 1. Rare PVCs 2. Sinus tachycardia   12/13/2004: EPS/Ablation CONCLUSION:  The study demonstrates successful electrolyte study and RF  catheter ablation of unusual AV node reentry tachycardia with a total of 12  RF energy applications delivered in brief duration to multiple sites, 6  through 8 and Koch's triangle.  There were no immediate procedure  complications.  Recent Labs: 06/22/2022: BUN 18; Creatinine, Ser 0.84; Hemoglobin 14.2; Platelets 214.0; Potassium 4.0; Sodium 141; TSH 0.20 09/14/2022: ALT 11   06/22/2022: VLDL 23.4 09/14/2022: Chol/HDL Ratio 2.6; Cholesterol, Total 172; HDL 65; LDL Chol Calc (NIH) 89; Triglycerides 101   CrCl cannot be calculated (Patient's most recent lab result is older than the maximum 21 days allowed.).   Wt Readings from Last 3 Encounters:  06/22/22 180 lb 9.6 oz (81.9 kg)  06/09/22 178 lb 8 oz (81 kg)  04/27/22 171 lb 9.6 oz (77.8 kg)     Other studies reviewed: Additional studies/records reviewed today include: summarized above  ASSESSMENT AND PLAN:  1. Atypical AFlutter     CHA2DS2Vasc is one for gender     *** She is maintained on warfarin with h/p PE and hypercoagulable status w/protien S deficiency  2. HTN ***      Disposition:***   Current medicines are reviewed at length with  the patient today.  The patient did not have any concerns regarding medicines.  Norma Fredrickson, PA-C 11/04/2022 3:36 PM     [facility] [address] [place] [address] [phone] (office)  [phone] (fax)

## 2022-11-08 ENCOUNTER — Ambulatory Visit: Payer: PRIVATE HEALTH INSURANCE | Attending: Physician Assistant | Admitting: Physician Assistant

## 2022-11-08 ENCOUNTER — Other Ambulatory Visit (INDEPENDENT_AMBULATORY_CARE_PROVIDER_SITE_OTHER): Payer: PRIVATE HEALTH INSURANCE

## 2022-11-08 ENCOUNTER — Encounter: Payer: Self-pay | Admitting: Physician Assistant

## 2022-11-08 ENCOUNTER — Other Ambulatory Visit: Payer: Self-pay | Admitting: Physician Assistant

## 2022-11-08 ENCOUNTER — Other Ambulatory Visit: Payer: Self-pay

## 2022-11-08 VITALS — BP 110/80 | HR 74 | Ht 68.0 in | Wt 165.6 lb

## 2022-11-08 DIAGNOSIS — I4719 Other supraventricular tachycardia: Secondary | ICD-10-CM

## 2022-11-08 DIAGNOSIS — R072 Precordial pain: Secondary | ICD-10-CM

## 2022-11-08 DIAGNOSIS — R002 Palpitations: Secondary | ICD-10-CM

## 2022-11-08 DIAGNOSIS — R55 Syncope and collapse: Secondary | ICD-10-CM

## 2022-11-08 MED ORDER — METOPROLOL TARTRATE 50 MG PO TABS: 50.0000 mg | ORAL_TABLET | Freq: Once | ORAL | 0 refills | Status: DC

## 2022-11-08 NOTE — Patient Instructions (Addendum)
Medication Instructions:  DO NOT TAKE YOUR TOPROL XL 25MG  AS NEEDED DOSE ON DAY OF CORONARY CT TEST *If you need a refill on your cardiac medications before your next appointment, please call your pharmacy*   Lab Work: BMET 1 WEEK PRIOR TO CORONARY CTA If you have labs (blood work) drawn today and your tests are completely normal, you will receive your results only by: MyChart Message (if you have MyChart) OR A paper copy in the mail If you have any lab test that is abnormal or we need to change your treatment, we will call you to review the results.   Testing/Procedures: Your physician has requested that you have an echocardiogram. Echocardiography is a painless test that uses sound waves to create images of your heart. It provides your doctor with information about the size and shape of your heart and how well your heart's chambers and valves are working. This procedure takes approximately one hour. There are no restrictions for this procedure. Please do NOT wear cologne, perfume, aftershave, or lotions (deodorant is allowed). Please arrive 15 minutes prior to your appointment time.  Please note: We ask at that you not bring children with you during ultrasound (echo/ vascular) testing. Due to room size and safety concerns, children are not allowed in the ultrasound rooms during exams. Our front office staff cannot provide observation of children in our lobby area while testing is being conducted. An adult accompanying a patient to their appointment will only be allowed in the ultrasound room at the discretion of the ultrasound technician under special circumstances. We apologize for any inconvenience.  CORONARY CTA W/FFR  ZIO AT Long term monitor-Live Telemetry  Your physician has requested you wear a ZIO patch monitor for 14 days.  This is a single patch monitor. [employer] supplies one patch monitor per enrollment. Additional  stickers are not available.  Please do not apply patch if you  will be having a Nuclear Stress Test, Echocardiogram, Cardiac CT, MRI,  or Chest Xray during the period you would be wearing the monitor. The patch cannot be worn during  these tests. You cannot remove and re-apply the ZIO AT patch monitor.  Your ZIO patch monitor will be mailed 3 day USPS to your address on file. It may take 3-5 days to  receive your monitor after you have been enrolled.  Once you have received your monitor, please review the enclosed instructions. Your monitor has  already been registered assigning a specific monitor serial # to you.   Billing and Patient Assistance Program information  Meredeth Ide has been supplied with any insurance information on record for billing. [employer] offers a sliding scale Patient Assistance Program for patients without insurance, or whose  insurance does not completely cover the cost of the ZIO patch monitor. You must apply for the  Patient Assistance Program to qualify for the discounted rate. To apply, call [employer] at [phone],  select option 4, select option 2 , ask to apply for the Patient Assistance Program, (you can request an  interpreter if needed). [employer] will ask your household income and how many people are in your  household. [employer] will quote your out-of-pocket cost based on this information. They will also be able  to set up a 12 month interest free payment plan if needed.  Applying the monitor   Shave hair from upper left chest.  Hold the abrader disc by orange tab. Rub the abrader in 40 strokes over left upper chest as indicated in  your  monitor instructions.  Clean area with 4 enclosed alcohol pads. Use all pads to ensure the area is cleaned thoroughly. Let  dry.  Apply patch as indicated in monitor instructions. Patch will be placed under collarbone on left side of  chest with arrow pointing upward.  Rub patch adhesive wings for 2 minutes. Remove the white label marked "1". Remove the white label  marked "2". Rub patch  adhesive wings for 2 additional minutes.  While looking in a mirror, press and release button in center of patch. A small green light will flash 3-4  times. This will be your only indicator that the monitor has been turned on.  Do not shower for the first 24 hours. You may shower after the first 24 hours.  Press the button if you feel a symptom. You will hear a small click. Record Date, Time and Symptom in  the Patient Log.   Starting the Gateway  In your kit there is a Audiological scientist box the size of a cellphone. This is Buyer, retail. It transmits all your  recorded data to [facility]. This box must always stay within 10 feet of you. Open the box and push the *  button. There will be a light that blinks orange and then green a few times. When the light stops  blinking, the Gateway is connected to the ZIO patch. Call [employer] at [phone] to confirm your monitor is transmitting.  Returning your monitor  Remove your patch and place it inside the Gateway. In the lower half of the Gateway there is a white  bag with prepaid postage on it. Place Gateway in bag and seal. Mail package back to [place] as soon as  possible. Your physician should have your final report approximately 7 days after you have mailed back  your monitor. Call [facility] Customer Care at [phone] if you have questions regarding your ZIO AT  patch monitor. Call them immediately if you see an orange light blinking on your monitor.  If your monitor falls off in less than 4 days, contact our Monitor department at [phone]. If your  monitor becomes loose or falls off after 4 days call [employer] at [phone] for suggestions on  securing your monitor   Follow-Up: At [facility], you and your health needs are our priority.  As part of our continuing mission to provide you with exceptional heart care, we have created designated Provider Care Teams.  These Care Teams include your primary Cardiologist  (physician) and Advanced Practice Providers (APPs -  Physician Assistants and Nurse Practitioners) who all work together to provide you with the care you need, when you need it.  We recommend signing up for the patient portal called "MyChart".  Sign up information is provided on this After Visit Summary.  MyChart is used to connect with patients for Virtual Visits (Telemedicine).  Patients are able to view lab/test results, encounter notes, upcoming appointments, etc.  Non-urgent messages can be sent to your provider as well.   To learn more about what you can do with MyChart, go to [email or web address].    Your next appointment:   4-6 week(s)  Provider:   Lewayne Bunting, MD  or Francis Dowse, PA-C   Other Instructions   INSTRUCTIONS FOR CORONARY CTA Please arrive at the [facility] main entrance of [facility] at (30-45 minutes prior to test start time)  [facility] [address] [place], [place] [address] [phone]  Proceed to the [facility]  Radiology Department (First Floor).  Please follow these instructions carefully (unless otherwise directed): PLEASE HAVE LABS - BMP  AT LEAST ONE WEEK PRIOR TO TEST  On the Night Before the Test: Drink plenty of water. Do not consume any caffeinated/decaffeinated beverages or chocolate 12 hours prior to your test. Do not take any antihistamines 12 hours prior to your test.  On the Day of the Test: Drink plenty of water. Do not drink any water within one hour of the test. Do not eat any food 4 hours prior to the test. You may take your regular medications prior to the test. Take 50 mg of Lopressor (Metoprolol) one hour before the test.  After the Test: Drink plenty of water. After receiving IV contrast, you may experience a mild flushed feeling. This is normal. On occasion, you may experience a mild rash up to 24 hours after the test. This is not dangerous. If this occurs, you can take Benadryl 25 mg and increase  your fluid intake. If you experience trouble breathing, this can be serious. If it is severe call 911 IMMEDIATELY. If it is mild, please call our office.

## 2022-11-08 NOTE — Progress Notes (Unsigned)
ZIO AT serial # [id number] from office inventory applied to patient.  Dr. Ladona Ridgel to read.

## 2022-11-09 LAB — LIPID PANEL
Chol/HDL Ratio: 2.6 {ratio} (ref 0.0–4.4)
Cholesterol, Total: 170 mg/dL (ref 100–199)
HDL: 66 mg/dL (ref >39)
LDL Chol Calc (NIH): 85 mg/dL (ref 0–99)
Triglycerides: 107 mg/dL (ref 0–149)
VLDL Cholesterol Cal: 19 mg/dL (ref 5–40)

## 2022-11-09 LAB — BASIC METABOLIC PANEL
BUN/Creatinine Ratio: 18 (ref 12–28)
BUN: 13 mg/dL (ref 8–27)
CO2: 22 mmol/L (ref 20–29)
Calcium: 9.2 mg/dL (ref 8.7–10.3)
Chloride: 103 mmol/L (ref 96–106)
Creatinine, Ser: 0.71 mg/dL (ref 0.57–1.00)
Glucose: 92 mg/dL (ref 70–99)
Potassium: 4.4 mmol/L (ref 3.5–5.2)
Sodium: 141 mmol/L (ref 134–144)
eGFR: 96 mL/min/{1.73_m2} (ref >59)

## 2022-11-18 ENCOUNTER — Other Ambulatory Visit: Payer: Self-pay | Admitting: Internal Medicine

## 2022-11-18 ENCOUNTER — Other Ambulatory Visit: Payer: Self-pay | Admitting: Nurse Practitioner

## 2022-11-18 ENCOUNTER — Telehealth: Payer: Self-pay | Admitting: Physician Assistant

## 2022-11-18 DIAGNOSIS — Z7901 Long term (current) use of anticoagulants: Secondary | ICD-10-CM

## 2022-11-18 NOTE — Telephone Encounter (Signed)
Melinda Benton with [facility] calling to have order for CT cardiac morphe faxed to their facility. She states pt has to have it done there so insurance will cover, she stated they will not cover if she goes to [facility].  Fax: [phone]   I also informed her to have pt call about this as well but she is still scheduled for CT next Wednesday at [facility] and Im not sure if she is aware of this or not.

## 2022-11-18 NOTE — Telephone Encounter (Signed)
Left voicemail to return call to office.

## 2022-11-21 ENCOUNTER — Encounter (HOSPITAL_COMMUNITY): Payer: Self-pay

## 2022-11-21 NOTE — Telephone Encounter (Signed)
Melinda Benton with [facility] is returning phone call. Please advise.

## 2022-11-21 NOTE — Telephone Encounter (Signed)
Spoke with patient and she was unaware her CT was scheduled or why [facility] was calling.   She have all instructions for CT and has metoprolol XL ready for day of CT.   Pre-Cert was CT covered with her insurance to [facility].  [facility] would [facility] to [facility] to [facility].  Also patient is asking about and ultrasound. Did patient need ultrasound? She states she was told it was schedule 12/4 at 330 right before echo at 3:50

## 2022-11-21 NOTE — Telephone Encounter (Signed)
Left voicemail for patient to return call to office. 

## 2022-11-23 ENCOUNTER — Ambulatory Visit (HOSPITAL_COMMUNITY)
Admission: RE | Admit: 2022-11-23 | Discharge: 2022-11-23 | Disposition: A | Discharge status: Home / Self Care | Payer: PRIVATE HEALTH INSURANCE | Source: Ambulatory Visit | Attending: Physician Assistant | Admitting: Physician Assistant

## 2022-11-23 DIAGNOSIS — R55 Syncope and collapse: Secondary | ICD-10-CM | POA: Insufficient documentation

## 2022-11-23 DIAGNOSIS — R072 Precordial pain: Secondary | ICD-10-CM | POA: Insufficient documentation

## 2022-11-23 DIAGNOSIS — R002 Palpitations: Secondary | ICD-10-CM | POA: Insufficient documentation

## 2022-11-23 MED ORDER — NITROGLYCERIN 0.4 MG SL SUBL
0.8000 mg | SUBLINGUAL_TABLET | Freq: Once | SUBLINGUAL | Status: AC
  Administered 2022-11-23: 0.8 mg via SUBLINGUAL

## 2022-11-23 MED ORDER — NITROGLYCERIN 0.4 MG SL SUBL
SUBLINGUAL_TABLET | SUBLINGUAL | Status: AC
  Filled 2022-11-23: qty 2

## 2022-11-23 MED ORDER — IOHEXOL 350 MG/ML SOLN
100.0000 mL | Freq: Once | INTRAVENOUS | Status: AC | PRN
  Administered 2022-11-23: 100 mL via INTRAVENOUS

## 2022-11-28 ENCOUNTER — Ambulatory Visit (INDEPENDENT_AMBULATORY_CARE_PROVIDER_SITE_OTHER): Payer: PRIVATE HEALTH INSURANCE

## 2022-11-28 DIAGNOSIS — Z7901 Long term (current) use of anticoagulants: Secondary | ICD-10-CM

## 2022-11-28 LAB — POCT INR: INR: 2.7 (ref 2.0–3.0)

## 2022-11-28 NOTE — Progress Notes (Signed)
Pt tests INR at home and submits results via [employer] portal. Pt submitted result last night and it is 2.7 Continue 6mg  daily except take 3mg  on Wednesdays and Saturdays. Recheck in 4 weeks, on 12/26/22.  LVM with instructions.

## 2022-11-28 NOTE — Patient Instructions (Addendum)
Pre visit review using our clinic review tool, if applicable. No additional management support is needed unless otherwise documented below in the visit note.  Continue 6mg  daily except take 3mg  on Wednesdays and Saturdays. Recheck in 4 weeks, on 12/26/22.

## 2022-12-07 ENCOUNTER — Ambulatory Visit (HOSPITAL_COMMUNITY): Payer: PRIVATE HEALTH INSURANCE | Attending: Internal Medicine

## 2022-12-07 DIAGNOSIS — R002 Palpitations: Secondary | ICD-10-CM | POA: Diagnosis present

## 2022-12-07 DIAGNOSIS — R55 Syncope and collapse: Secondary | ICD-10-CM | POA: Diagnosis not present

## 2022-12-07 DIAGNOSIS — R072 Precordial pain: Secondary | ICD-10-CM | POA: Insufficient documentation

## 2022-12-07 LAB — ECHOCARDIOGRAM COMPLETE
Area-P 1/2: 3.6 cm2
S' Lateral: 2.6 cm

## 2022-12-13 ENCOUNTER — Encounter: Payer: Self-pay | Admitting: Internal Medicine

## 2022-12-13 ENCOUNTER — Ambulatory Visit: Payer: PRIVATE HEALTH INSURANCE | Attending: Internal Medicine | Admitting: Internal Medicine

## 2022-12-13 VITALS — BP 128/74 | HR 72 | Ht 68.0 in | Wt 161.8 lb

## 2022-12-13 DIAGNOSIS — I4719 Other supraventricular tachycardia: Secondary | ICD-10-CM | POA: Diagnosis not present

## 2022-12-13 NOTE — Progress Notes (Signed)
HPI Melinda Benton returns today for followup. 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 She has a h/o PE due to protein S def and has been on warfarin. She had a single episode of atrial fib. She wore a cardiac monitor which demonstrated NS AT. She was found to be hypothyroid. 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   No Known Allergies   Current Outpatient Medications  Medication Sig Dispense Refill   Alirocumab (PRALUENT) 150 MG/ML SOAJ Inject 1 mL (150 mg total) into the skin every 14 (fourteen) days. 2 mL 2   buPROPion (WELLBUTRIN XL) 300 MG 24 hr tablet Take 1 tablet (300 mg total) by mouth daily. Need Office Visit for additional refills 90 tablet 0   ibandronate (BONIVA) 150 MG tablet Take 1 tablet (150 mg total) by mouth every 30 (thirty) days. Take in the morning with a full glass of water, on an empty stomach, and do not take anything else by mouth or lie down for the next 30 min. 3 tablet 3   levothyroxine (SYNTHROID, LEVOTHROID) 88 MCG tablet Take 88 mcg by mouth daily before breakfast.     metoprolol succinate (TOPROL XL) 25 MG 24 hr tablet Take 1 tablet (25 mg total) by mouth daily as needed. 90 tablet 3   pantoprazole (PROTONIX) 20 MG tablet Take 1 tablet (20 mg total) by mouth daily as needed for heartburn or indigestion. 90 tablet 0   triamterene-hydrochlorothiazide (DYAZIDE) 37.5-25 MG capsule Take 1 capsule by mouth daily as needed.     warfarin (COUMADIN) 6 MG tablet TAKE 1 TABLEY BY MOUTH DAILY **EXCEPT ON WEDNESDAYS AND SATURDAYS TAKE 1/2 TABLET 52 tablet 0   ezetimibe (ZETIA) 10 MG tablet Take 1 tablet (10 mg total) by mouth daily. 90 tablet 3   metoprolol tartrate (LOPRESSOR) 50 MG tablet Take 1 tablet (50 mg total) by mouth once for 1 dose. Take 1 hour prior to test 1 tablet 0   No current  facility-administered medications for this visit.     Past Medical History:  Diagnosis Date   Arthritis    Clotting disorder (HCC)    Cough 05/19/2014   H/O blood clots    History of chicken pox    Migraines    Mitral valve prolapse    Thyroid disease     ROS:   All systems reviewed and negative except as noted in the HPI.   Past Surgical History:  Procedure Laterality Date   bladder sur     CESAREAN SECTION     2   CHOLECYSTECTOMY N/A 07/07/2019   Procedure: LAPAROSCOPIC CHOLECYSTECTOMY WITH INTRAOPERATIVE CHOLANGIOGRAM;  Surgeon: Griselda Miner, MD;  Location: [facility];  Service: General;  Laterality: N/A;   ELBOW SURGERY  2009   knee repla Left 07/2016   partial knee replacement    ROTATOR CUFF REPAIR  2011   TONSILLECTOMY  ?1968     Family History  Problem Relation Age of Onset   Protein S deficiency Mother    Clotting disorder Mother    Heart disease Father    Diabetes Father    Headache Father    Colon cancer Paternal Grandfather    Stroke Brother    Diabetes Paternal Aunt    Diabetes Paternal Uncle  Colon cancer Paternal Grandmother 1   Colon cancer Cousin 35       paternal     Social History   Socioeconomic History   Marital status: Married    Spouse name: Not on file   Number of children: 3   Years of education: 16   Highest education level: Not on file  Occupational History   Occupation: Technical brewer  Tobacco Use   Smoking status: Never   Smokeless tobacco: Never  Vaping Use   Vaping status: Never Used  Substance and Sexual Activity   Alcohol use: Yes    Comment: Socially   Drug use: No   Sexual activity: Yes  Other Topics Concern   Not on file  Social History Narrative   Regular exercise-yes   Caffeine Use-yes   Social Determinants of Health   Financial Resource Strain: Not on file  Food Insecurity: Not on file  Transportation Needs: Not on file  Physical Activity: Not on file  Stress: Not on file  Social  Connections: Not on file  Intimate Partner Violence: Not on file     BP 128/74   Pulse 72   Ht 5\' 8"  (1.727 m)   Wt 161 lb 12.8 oz (73.4 kg)   LMP 05/01/2012   SpO2 98%   BMI 24.60 kg/m   Physical Exam:  Well appearing NAD HEENT: Unremarkable Neck:  No JVD, no thyromegally Lymphatics:  No adenopathy Back:  No CVA tenderness Lungs:  Clear with no wheezes HEART:  Regular rate rhythm, no murmurs, no rubs, no clicks Abd:  soft, positive bowel sounds, no organomegally, no rebound, no guarding Ext:  2 plus pulses, no edema, no cyanosis, no clubbing Skin:  No rashes no nodules Neuro:  CN II through XII intact, motor grossly intact   Assess/Plan: AT - her symptoms are well controlled. She will continue with her beta blocker as needed for break through palpitations.  PAF - she has one docuemented episode. She will be on life long systemic anti-coag due to the protein S. Continue current meds. Not enough symptoms to warrant an AA drug.  Sharlot Gowda Keaghan Staton,MD

## 2022-12-21 ENCOUNTER — Ambulatory Visit (INDEPENDENT_AMBULATORY_CARE_PROVIDER_SITE_OTHER): Payer: PRIVATE HEALTH INSURANCE

## 2022-12-21 DIAGNOSIS — Z7901 Long term (current) use of anticoagulants: Secondary | ICD-10-CM

## 2022-12-21 LAB — POCT INR: INR: 2.9 (ref 2.0–3.0)

## 2022-12-21 NOTE — Patient Instructions (Addendum)
Pre visit review using our clinic review tool, if applicable. No additional management support is needed unless otherwise documented below in the visit note.  Continue 6mg  daily except take 3mg  on Wednesdays and Saturdays. Recheck in 4 weeks, on 01/18/23.

## 2022-12-21 NOTE — Progress Notes (Signed)
Pt tests INR at home and submits results via [employer] portal. Pt submitted result last night and it is 2.9 Continue 6mg  daily except take 3mg  on Wednesdays and Saturdays. Recheck in 4 weeks, on 01/18/23.  LVM with instructions.

## 2022-12-21 NOTE — Progress Notes (Signed)
I have reviewed and agree with note, evaluation, plan.   Jeffory Snelgrove, MD  

## 2022-12-29 ENCOUNTER — Other Ambulatory Visit: Payer: Self-pay | Admitting: Family

## 2022-12-29 MED ORDER — OSELTAMIVIR PHOSPHATE 75 MG PO CAPS: 75.0000 mg | ORAL_CAPSULE | Freq: Every day | ORAL | 0 refills | Status: DC

## 2023-01-18 ENCOUNTER — Ambulatory Visit (INDEPENDENT_AMBULATORY_CARE_PROVIDER_SITE_OTHER): Payer: PRIVATE HEALTH INSURANCE

## 2023-01-18 DIAGNOSIS — Z7901 Long term (current) use of anticoagulants: Secondary | ICD-10-CM

## 2023-01-18 LAB — POCT INR: INR: 4 — AB (ref 2.0–3.0)

## 2023-01-18 NOTE — Progress Notes (Signed)
 Pt tests INR at home and submits results via [employer] portal. Pt submitted result last night and it is 4.0 Pt reports not eating much lately. She thinks she may have had a virus and also just learned that her daughter will be getting married in April/May and she is very stressed about getting everyone ready. Hold dose today and reduce dose tomorrow to take 3 mg and then change weekly dose to take  6mg  daily except take 3mg  on Mondays, Wednesdays and Saturdays. Recheck in 2 weeks, on 02/01/23.  Advised pt of dosing and retest date. Pt verbalized understanding.

## 2023-01-18 NOTE — Patient Instructions (Addendum)
 Pre visit review using our clinic review tool, if applicable. No additional management support is needed unless otherwise documented below in the visit note.  Hold dose today and reduce dose tomorrow to take 3 mg and then change weekly dose to take  6mg  daily except take 3mg  on Mondays, Wednesdays and Saturdays. Recheck in 2 weeks, on 02/01/23.

## 2023-01-22 ENCOUNTER — Other Ambulatory Visit: Payer: Self-pay | Admitting: Nurse Practitioner

## 2023-01-22 DIAGNOSIS — Z7901 Long term (current) use of anticoagulants: Secondary | ICD-10-CM

## 2023-01-23 NOTE — Telephone Encounter (Signed)
Refill request for Warfarin 6 mg LR  11/18/22, #52, 0 rf LOV  06/22/22 FOV   none scheduled.   Please review and advise.  Thanks. Dm/cma

## 2023-01-30 LAB — POCT INR: INR: 2.7 (ref 2.0–3.0)

## 2023-01-31 ENCOUNTER — Ambulatory Visit (INDEPENDENT_AMBULATORY_CARE_PROVIDER_SITE_OTHER): Payer: PRIVATE HEALTH INSURANCE

## 2023-01-31 DIAGNOSIS — Z7901 Long term (current) use of anticoagulants: Secondary | ICD-10-CM | POA: Diagnosis not present

## 2023-01-31 MED ORDER — WARFARIN SODIUM 6 MG PO TABS: ORAL_TABLET | ORAL | 1 refills | Status: DC

## 2023-01-31 NOTE — Progress Notes (Signed)
Pt tests INR at home and submits results via [employer] portal. Pt submitted result last night and it is 2.7 Continue 6mg  daily except take 3mg  on Mondays, Wednesdays and Saturdays. Recheck in 2 weeks, on 02/13/23.  Advised pt of dosing and retest date. Pt verbalized understanding.   Pt is compliant with warfarin management and PCP apts.  Sent in refill of warfarin to requested pharmacy.

## 2023-01-31 NOTE — Patient Instructions (Addendum)
Pre visit review using our clinic review tool, if applicable. No additional management support is needed unless otherwise documented below in the visit note.  Continue 6mg  daily except take 3mg  on Mondays, Wednesdays and Saturdays. Recheck in 2 weeks, on 02/13/23.

## 2023-02-15 ENCOUNTER — Ambulatory Visit (INDEPENDENT_AMBULATORY_CARE_PROVIDER_SITE_OTHER): Payer: PRIVATE HEALTH INSURANCE

## 2023-02-15 ENCOUNTER — Telehealth: Payer: Self-pay

## 2023-02-15 DIAGNOSIS — Z7901 Long term (current) use of anticoagulants: Secondary | ICD-10-CM | POA: Diagnosis not present

## 2023-02-15 LAB — POCT INR: INR: 2.7 (ref 2.0–3.0)

## 2023-02-15 NOTE — Patient Instructions (Addendum)
Pre visit review using our clinic review tool, if applicable. No additional management support is needed unless otherwise documented below in the visit note.  Continue 6mg  daily except take 3mg  on Mondays, Wednesdays and Saturdays. Recheck in 4 weeks, on 03/15/23.

## 2023-02-15 NOTE — Telephone Encounter (Signed)
Pt tests INR at home. Time for pt to test. LVM

## 2023-02-15 NOTE — Progress Notes (Signed)
Pt tests INR at home and submits results via [employer] portal. Pt submitted result today and it is 2.7 Continue 6mg  daily except take 3mg  on Mondays, Wednesdays and Saturdays. Recheck in 4 weeks, on 03/15/23.  LVM with dosing instructions an next test date.

## 2023-03-08 ENCOUNTER — Ambulatory Visit (INDEPENDENT_AMBULATORY_CARE_PROVIDER_SITE_OTHER): Payer: PRIVATE HEALTH INSURANCE

## 2023-03-08 DIAGNOSIS — Z7901 Long term (current) use of anticoagulants: Secondary | ICD-10-CM

## 2023-03-08 LAB — POCT INR: INR: 2.2 (ref 2.0–3.0)

## 2023-03-08 NOTE — Progress Notes (Signed)
 Pt tests INR at home and submits results via [employer] portal. Pt submitted result today and it is 2.2 Continue 6mg  daily except take 3mg  on Mondays, Wednesdays and Saturdays. Recheck in 4 weeks, on 04/05/23.  LVM with dosing instructions an next test date.

## 2023-03-08 NOTE — Patient Instructions (Addendum)
 Pre visit review using our clinic review tool, if applicable. No additional management support is needed unless otherwise documented below in the visit note.  Continue 6mg  daily except take 3mg  on Mondays, Wednesdays and Saturdays. Recheck in 4 weeks, on 04/05/23.

## 2023-03-20 ENCOUNTER — Ambulatory Visit (INDEPENDENT_AMBULATORY_CARE_PROVIDER_SITE_OTHER): Payer: Self-pay

## 2023-03-20 DIAGNOSIS — Z7901 Long term (current) use of anticoagulants: Secondary | ICD-10-CM | POA: Diagnosis not present

## 2023-03-20 LAB — POCT INR: INR: 3.3 — AB (ref 2.0–3.0)

## 2023-03-20 NOTE — Progress Notes (Signed)
 Pt tests INR at home and submits results via [employer] portal. Pt submitted result today and it is 3.3 Pr reports she feels she has been bruising easier than normal and requested to test INR today. Pt also reports she has gone from hypothyroidism to hyperthyroidism. Endocrinology advised to skip one dose a week of levothyroxine. She did not agree with this and slowly weaned herself off of levothyroxine completely. She is going to request a referral to a different endocrinologist from her PCP. Hold warfarin today and then continue 6mg  daily except take 3mg  on Mondays, Wednesdays and Saturdays. Recheck in 2 weeks, on 04/03/23.  Contacted pt by phone and advised of dosing and recheck date. Advised if any s/s of abnormal bruising or bleeding to go to ER. Pt verbalized understanding.

## 2023-03-20 NOTE — Patient Instructions (Addendum)
 Pre visit review using our clinic review tool, if applicable. No additional management support is needed unless otherwise documented below in the visit note.  Hold warfarin today and then continue 6mg  daily except take 3mg  on Mondays, Wednesdays and Saturdays. Recheck in 2 weeks, on 04/03/23.

## 2023-04-03 LAB — POCT INR: INR: 1.9 — AB (ref 2.0–3.0)

## 2023-04-04 ENCOUNTER — Ambulatory Visit (INDEPENDENT_AMBULATORY_CARE_PROVIDER_SITE_OTHER): Payer: PRIVATE HEALTH INSURANCE

## 2023-04-04 DIAGNOSIS — Z7901 Long term (current) use of anticoagulants: Secondary | ICD-10-CM | POA: Diagnosis not present

## 2023-04-04 NOTE — Patient Instructions (Addendum)
 Pre visit review using our clinic review tool, if applicable. No additional management support is needed unless otherwise documented below in the visit note.  Increase dose today to take 9 mg and then continue 6mg  daily except take 3mg  on Mondays, Wednesdays and Saturdays. Recheck in 3 weeks, on 04/24/23.

## 2023-04-04 NOTE — Progress Notes (Signed)
 Pt tests INR at home and submits results via [employer] portal. Pt submitted result today for test performed last night.  INR is 1.9 Increase dose today to take 9 mg and then continue 6mg  daily except take 3mg  on Mondays, Wednesdays and Saturdays. Recheck in 3 weeks, on 04/24/23.  LVM with dosing instructions and recheck date.

## 2023-04-10 ENCOUNTER — Ambulatory Visit: Payer: PRIVATE HEALTH INSURANCE | Admitting: Family Medicine

## 2023-04-11 ENCOUNTER — Ambulatory Visit: Payer: PRIVATE HEALTH INSURANCE

## 2023-04-11 ENCOUNTER — Other Ambulatory Visit: Payer: Self-pay | Admitting: *Deleted

## 2023-04-11 ENCOUNTER — Ambulatory Visit (HOSPITAL_COMMUNITY)
Admission: RE | Admit: 2023-04-11 | Discharge: 2023-04-11 | Disposition: A | Discharge status: Home / Self Care | Payer: PRIVATE HEALTH INSURANCE | Source: Ambulatory Visit | Attending: Vascular Surgery | Admitting: Vascular Surgery

## 2023-04-11 VITALS — BP 115/78 | HR 84 | Temp 98.5°F | Resp 18 | Ht 68.0 in | Wt 151.4 lb

## 2023-04-11 DIAGNOSIS — I872 Venous insufficiency (chronic) (peripheral): Secondary | ICD-10-CM

## 2023-04-11 DIAGNOSIS — I868 Varicose veins of other specified sites: Secondary | ICD-10-CM | POA: Diagnosis not present

## 2023-04-11 DIAGNOSIS — I8393 Asymptomatic varicose veins of bilateral lower extremities: Secondary | ICD-10-CM

## 2023-04-11 DIAGNOSIS — I781 Nevus, non-neoplastic: Secondary | ICD-10-CM

## 2023-04-12 ENCOUNTER — Encounter: Payer: Self-pay | Admitting: Physician Assistant

## 2023-04-12 NOTE — Progress Notes (Signed)
 Office Note     CC:  follow up Requesting Provider:  Anne Ng, NP  HPI: Melinda Benton is a 63 y.o. (10-01-1960) female who presents for prominent superficial veins of the left lower leg.  Patient states these have been present more so over the past year.  She is on Coumadin for known protein S deficiency.  She does not wear compression.  She works out of the house and does not elevate her legs much during the day.  She has no history of DVT, venous ulcerations, trauma, or prior vascular interventions.  She denies tobacco use.  She is relatively active.   Past Medical History:  Diagnosis Date   Arthritis    Clotting disorder (HCC)    Cough 05/19/2014   H/O blood clots    History of chicken pox    Migraines    Mitral valve prolapse    Thyroid disease     Past Surgical History:  Procedure Laterality Date   bladder sur     CESAREAN SECTION     2   CHOLECYSTECTOMY N/A 07/07/2019   Procedure: LAPAROSCOPIC CHOLECYSTECTOMY WITH INTRAOPERATIVE CHOLANGIOGRAM;  Surgeon: Griselda Miner, MD;  Location: [facility];  Service: General;  Laterality: N/A;   ELBOW SURGERY  2009   knee repla Left 07/2016   partial knee replacement    ROTATOR CUFF REPAIR  2011   TONSILLECTOMY  ?1968    Social History   Socioeconomic History   Marital status: Married    Spouse name: Not on file   Number of children: 3   Years of education: 16   Highest education level: Not on file  Occupational History   Occupation: Technical brewer  Tobacco Use   Smoking status: Never   Smokeless tobacco: Never  Vaping Use   Vaping status: Never Used  Substance and Sexual Activity   Alcohol use: Yes    Comment: Socially   Drug use: No   Sexual activity: Yes  Other Topics Concern   Not on file  Social History Narrative   Regular exercise-yes   Caffeine Use-yes   Social Drivers of Health   Financial Resource Strain: Not on file  Food Insecurity: Not on file  Transportation Needs: Not on file   Physical Activity: Not on file  Stress: Not on file  Social Connections: Not on file  Intimate Partner Violence: Not on file    Family History  Problem Relation Age of Onset   Protein S deficiency Mother    Clotting disorder Mother    Heart disease Father    Diabetes Father    Headache Father    Colon cancer Paternal Grandfather    Stroke Brother    Diabetes Paternal Aunt    Diabetes Paternal Uncle    Colon cancer Paternal Grandmother 48   Colon cancer Cousin 35       paternal    Current Outpatient Medications  Medication Sig Dispense Refill   Alirocumab (PRALUENT) 150 MG/ML SOAJ Inject 1 mL (150 mg total) into the skin every 14 (fourteen) days. 2 mL 2   buPROPion (WELLBUTRIN XL) 300 MG 24 hr tablet Take 1 tablet (300 mg total) by mouth daily. Need Office Visit for additional refills 90 tablet 0   ibandronate (BONIVA) 150 MG tablet Take 1 tablet (150 mg total) by mouth every 30 (thirty) days. Take in the morning with a full glass of water, on an empty stomach, and do not take anything else by mouth or  lie down for the next 30 min. 3 tablet 3   levothyroxine (SYNTHROID, LEVOTHROID) 88 MCG tablet Take 88 mcg by mouth daily before breakfast.     metoprolol succinate (TOPROL XL) 25 MG 24 hr tablet Take 1 tablet (25 mg total) by mouth daily as needed. 90 tablet 3   oseltamivir (TAMIFLU) 75 MG capsule Take 1 capsule (75 mg total) by mouth daily. 10 capsule 0   triamterene-hydrochlorothiazide (DYAZIDE) 37.5-25 MG capsule Take 1 capsule by mouth daily as needed.     warfarin (COUMADIN) 6 MG tablet TAKE 6 MG BY MOUTH DAILY EXCEPT TAKE 3 MG ON MONDAY, WEDNESDAY AND SATURDAY OR AS DIRECTED BY ANTICOAGULATION CLINIC 120 tablet 1   ezetimibe (ZETIA) 10 MG tablet Take 1 tablet (10 mg total) by mouth daily. 90 tablet 3   metoprolol tartrate (LOPRESSOR) 50 MG tablet Take 1 tablet (50 mg total) by mouth once for 1 dose. Take 1 hour prior to test 1 tablet 0   pantoprazole (PROTONIX) 20 MG tablet  Take 1 tablet (20 mg total) by mouth daily as needed for heartburn or indigestion. (Patient not taking: Reported on 04/11/2023) 90 tablet 0   No current facility-administered medications for this visit.    No Known Allergies   REVIEW OF SYSTEMS:   [X]  denotes positive finding, [ ]  denotes negative finding Cardiac  Comments:  Chest pain or chest pressure:    Shortness of breath upon exertion:    Short of breath when lying flat:    Irregular heart rhythm:        Vascular    Pain in calf, thigh, or hip brought on by ambulation:    Pain in feet at night that wakes you up from your sleep:     Blood clot in your veins:    Leg swelling:         Pulmonary    Oxygen at home:    Productive cough:     Wheezing:         Neurologic    Sudden weakness in arms or legs:     Sudden numbness in arms or legs:     Sudden onset of difficulty speaking or slurred speech:    Temporary loss of vision in one eye:     Problems with dizziness:         Gastrointestinal    Blood in stool:     Vomited blood:         Genitourinary    Burning when urinating:     Blood in urine:        Psychiatric    Major depression:         Hematologic    Bleeding problems:    Problems with blood clotting too easily:        Skin    Rashes or ulcers:        Constitutional    Fever or chills:      PHYSICAL EXAMINATION:  Vitals:   04/11/23 1409  BP: 115/78  Pulse: 84  Resp: 18  Temp: 98.5 F (36.9 C)  TempSrc: Temporal  SpO2: 98%  Weight: 151 lb 6.4 oz (68.7 kg)  Height: 5\' 8"  (1.727 m)    General:  WDWN in NAD; vital signs documented above Gait: Not observed HENT: WNL, normocephalic Pulmonary: normal non-labored breathing , without Rales, rhonchi,  wheezing Cardiac: regular HR Abdomen: soft, NT, no masses Skin: without rashes Vascular Exam/Pulses: Palpable DP pulses Extremities: Spider veins left lower leg  without areas of ulceration; without hyperpigmentation or skin  induration Musculoskeletal: no muscle wasting or atrophy  Neurologic: A&O X 3 Psychiatric:  The pt has Normal affect.   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 female here for evaluation of prominent superficial spider veins of the left lower leg  [name] is a 63 year old female being evaluated for spider veins of the left lower leg.  Past medical history significant for protein S deficiency for which she is on Coumadin lifelong.  She has no history of DVT.  Left lower extremity venous reflux study was negative for DVT.  She has a competent deep system however does have an incompetent GSV from the saphenofemoral junction to the knee.  The diameter is less than 4 mm throughout thus she would not be a candidate for ablation therapy.  Recommendations include knee-high 15 to 20 mmHg for which she was measured and prescribed today.  We also reviewed proper leg elevation which should be performed periodically during the day.  She will continue to remain active and try to avoid prolonged sitting and standing.  We also discussed sclerotherapy and she was provided Cherene Julian, RN's card.  She will contact Harriett Sine directly if she would like to proceed with sclerotherapy.  She can follow-up on an as-needed basis.   Emilie Rutter, PA-C [facility] [phone]  Clinic MD:   Lenell Antu

## 2023-04-24 LAB — POCT INR: INR: 2.5 (ref 2.0–3.0)

## 2023-04-25 ENCOUNTER — Ambulatory Visit (INDEPENDENT_AMBULATORY_CARE_PROVIDER_SITE_OTHER): Payer: PRIVATE HEALTH INSURANCE

## 2023-04-25 DIAGNOSIS — Z7901 Long term (current) use of anticoagulants: Secondary | ICD-10-CM | POA: Diagnosis not present

## 2023-04-25 NOTE — Progress Notes (Signed)
 Pt tests INR at home and submits results via [employer] portal. Pt submitted result yesterday.  INR is 2.5 Continue 6mg  daily except take 3mg  on Mondays, Wednesdays and Saturdays. Recheck in 3 weeks, on 05/23/23.  LVM with dosing instructions and recheck date.

## 2023-04-25 NOTE — Patient Instructions (Addendum)
 Pre visit review using our clinic review tool, if applicable. No additional management support is needed unless otherwise documented below in the visit note.  Continue 6mg  daily except take 3mg  on Mondays, Wednesdays and Saturdays. Recheck in 3 weeks, on 05/23/23.

## 2023-05-03 ENCOUNTER — Ambulatory Visit: Payer: PRIVATE HEALTH INSURANCE | Admitting: Nurse Practitioner

## 2023-05-23 ENCOUNTER — Ambulatory Visit (INDEPENDENT_AMBULATORY_CARE_PROVIDER_SITE_OTHER): Payer: PRIVATE HEALTH INSURANCE

## 2023-05-23 DIAGNOSIS — Z7901 Long term (current) use of anticoagulants: Secondary | ICD-10-CM | POA: Diagnosis not present

## 2023-05-23 LAB — POCT INR: INR: 2.4 (ref 2.0–3.0)

## 2023-05-23 NOTE — Progress Notes (Cosign Needed Addendum)
 Pt tests INR at home and submits results via [employer] portal. Pt submitted result yesterday.  INR is 2.4 Continue 6mg  daily except take 3mg  on Mondays, Wednesdays and Saturdays. Recheck in 4 weeks, on 06/06/23.  Advised of dosing and recheck date.  Medical screening examination/treatment/procedure(s) were performed by non-physician practitioner and as supervising physician I was immediately available for consultation/collaboration.  I agree with above. Adelaide Holy, MD

## 2023-05-23 NOTE — Patient Instructions (Addendum)
 Pre visit review using our clinic review tool, if applicable. No additional management support is needed unless otherwise documented below in the visit note.  Continue 6mg  daily except take 3mg  on Mondays, Wednesdays and Saturdays. Recheck in 3 weeks, on 06/06/23.

## 2023-06-08 ENCOUNTER — Encounter: Payer: Self-pay | Admitting: Internal Medicine

## 2023-06-08 ENCOUNTER — Other Ambulatory Visit (HOSPITAL_COMMUNITY): Payer: Self-pay

## 2023-06-08 MED ORDER — PRALUENT 150 MG/ML ~~LOC~~ SOAJ: 150.0000 mg | SUBCUTANEOUS | 11 refills | Status: AC

## 2023-06-09 ENCOUNTER — Ambulatory Visit (INDEPENDENT_AMBULATORY_CARE_PROVIDER_SITE_OTHER): Payer: PRIVATE HEALTH INSURANCE

## 2023-06-09 ENCOUNTER — Encounter: Payer: PRIVATE HEALTH INSURANCE | Admitting: Vascular Surgery

## 2023-06-09 ENCOUNTER — Encounter (HOSPITAL_COMMUNITY): Payer: PRIVATE HEALTH INSURANCE

## 2023-06-09 DIAGNOSIS — Z7901 Long term (current) use of anticoagulants: Secondary | ICD-10-CM | POA: Diagnosis not present

## 2023-06-09 LAB — POCT INR: INR: 2.5 (ref 2.0–3.0)

## 2023-06-09 MED ORDER — WARFARIN SODIUM 6 MG PO TABS: ORAL_TABLET | ORAL | 1 refills | Status: AC

## 2023-06-09 NOTE — Progress Notes (Signed)
 Pt tests INR at home and submits results via [employer] portal. Pt submitted result yesterday.  INR is 2.5 Continue 6mg  daily except take 3mg  on Mondays, Wednesdays and Saturdays. Recheck in 4 weeks, on 07/04/23.  Tried to contact pt with dosing instructions but no answer and VM is full. Sent mychart msg.  Sent in new script. Pt is compliant with warfarin management and PCP apts.  Sent in refill of warfarin to requested pharmacy.

## 2023-06-09 NOTE — Patient Instructions (Addendum)
 Pre visit review using our clinic review tool, if applicable. No additional management support is needed unless otherwise documented below in the visit note.  Continue 6mg  daily except take 3mg  on Mondays, Wednesdays and Saturdays. Recheck in 4 weeks, on 07/04/23.

## 2023-06-13 ENCOUNTER — Ambulatory Visit: Payer: PRIVATE HEALTH INSURANCE | Admitting: Internal Medicine

## 2023-06-13 ENCOUNTER — Encounter: Payer: Self-pay | Admitting: Internal Medicine

## 2023-06-13 VITALS — BP 130/70 | HR 72 | Temp 97.6°F | Ht 68.0 in | Wt 144.2 lb

## 2023-06-13 DIAGNOSIS — H93A1 Pulsatile tinnitus, right ear: Secondary | ICD-10-CM

## 2023-06-13 NOTE — Progress Notes (Signed)
 [facility] [address] [place] [address] Dept: [phone] Dept Fax: [phone]  Acute Care Office Visit  Subjective:   Melinda Benton 1960-05-31 06/13/2023  Chief Complaint  Patient presents with   Ear Problem    Noticed last week, sound like blood flowing     HPI:  Discussed the use of AI scribe software for clinical note transcription with the patient, who gave verbal consent to proceed.  History of Present Illness   Melinda Benton is a 63 year old female with thyroid  dysfunction and mitral valve issues who presents with concerns in the right ear.  She experiences a whooshing noise in her right ear, described as hearing her blood flow, which began approximately a week ago around the time of her daughter's wedding. The noise is most noticeable at night when lying down in a quiet environment. It is synchronized with her heartbeat and is very annoying. She has not attempted any treatments for this issue yet. No decreased hearing, ear pain, discharge, or recent ear injury. Reports long-standing ringing in the ears but notes the current whooshing sound is different than the chronic tinnitus patient has experienced in the past. No headaches or vision changes.   She has a history of thyroid  dysfunction, with fluctuations between hypothyroidism and hyperthyroidism. Last year, she experienced significant weight gain and hair loss, prompting a thyroid  check that revealed hyperthyroidism. More recently, in May, her thyroid  levels indicated a return to hypothyroidism, with symptoms such as hair thinning and dry skin during these fluctuations.  She has a history of mitral valve issues and has experienced heart palpitations, which were evaluated by a cardiologist. Extensive testing did not reveal any significant cardiac issues. She is currently on warfarin for her mitral valve condition.     The following portions of the  patient's history were reviewed and updated as appropriate: past medical history, past surgical history, family history, social history, allergies, medications, and problem list.   Patient Active Problem List   Diagnosis Date Noted   Atypical atrial flutter (HCC) 04/27/2022   Bilateral hip pain 04/27/2022   Age-related osteoporosis without current pathological fracture 05/07/2021   Protein S deficiency (HCC) 11/24/2020   Family history of diabetes mellitus 04/21/2020   Hyperlipidemia 04/21/2020   Nontoxic single thyroid  nodule 04/21/2020   Closed nondisplaced fracture of proximal phalanx of lesser toe of right foot 02/18/2020   Closed nondisplaced fracture of proximal phalanx of lesser toe of left foot 12/02/2019   S/P laparoscopic cholecystectomy 07/11/2019   Family history of colon cancer 11/05/2018   Hypothyroidism 11/05/2018   Chronic left shoulder pain 03/06/2018   GAD (generalized anxiety disorder) 03/20/2017   S/P left unicompartmental knee replacement 07/19/2016   Chronic pain of right knee 06/07/2016   Spider varicose veins 09/01/2015   Palpitations 10/29/2013   Arthritis 10/09/2013   Anticoagulant long-term use 08/04/2010   History of pulmonary embolism 02/15/2010   Hypercoagulable state (HCC) 02/15/2010   Past Medical History:  Diagnosis Date   Arthritis    Clotting disorder (HCC)    Cough 05/19/2014   H/O blood clots    History of chicken pox    Migraines    Mitral valve prolapse    Thyroid  disease    Past Surgical History:  Procedure Laterality Date   bladder sur     CESAREAN SECTION     2   CHOLECYSTECTOMY N/A 07/07/2019   Procedure: LAPAROSCOPIC CHOLECYSTECTOMY WITH INTRAOPERATIVE CHOLANGIOGRAM;  Surgeon:  Caralyn Chandler, MD;  Location: [facility];  Service: General;  Laterality: N/A;   ELBOW SURGERY  2009   knee repla Left 07/2016   partial knee replacement    ROTATOR CUFF REPAIR  2011   TONSILLECTOMY  ?1968   Family History  Problem Relation Age of Onset    Protein S deficiency Mother    Clotting disorder Mother    Heart disease Father    Diabetes Father    Headache Father    Colon cancer Paternal Grandfather    Stroke Brother    Diabetes Paternal Aunt    Diabetes Paternal Uncle    Colon cancer Paternal Grandmother 57   Colon cancer Cousin 35       paternal    Current Outpatient Medications:    Alirocumab  (PRALUENT ) 150 MG/ML SOAJ, Inject 1 mL (150 mg total) into the skin every 14 (fourteen) days., Disp: 2 mL, Rfl: 11   buPROPion  (WELLBUTRIN  XL) 300 MG 24 hr tablet, Take 1 tablet (300 mg total) by mouth daily. Need Office Visit for additional refills, Disp: 90 tablet, Rfl: 0   ibandronate  (BONIVA ) 150 MG tablet, Take 1 tablet (150 mg total) by mouth every 30 (thirty) days. Take in the morning with a full glass of water, on an empty stomach, and do not take anything else by mouth or lie down for the next 30 min., Disp: 3 tablet, Rfl: 3   levothyroxine  (SYNTHROID , LEVOTHROID) 88 MCG tablet, Take 88 mcg by mouth daily before breakfast., Disp: , Rfl:    metoprolol  succinate (TOPROL  XL) 25 MG 24 hr tablet, Take 1 tablet (25 mg total) by mouth daily as needed., Disp: 90 tablet, Rfl: 3   pantoprazole  (PROTONIX ) 20 MG tablet, Take 1 tablet (20 mg total) by mouth daily as needed for heartburn or indigestion., Disp: 90 tablet, Rfl: 0   warfarin (COUMADIN ) 6 MG tablet, TAKE 6 MG BY MOUTH DAILY EXCEPT TAKE 3 MG ON MONDAY, WEDNESDAY AND SATURDAY OR AS DIRECTED BY ANTICOAGULATION CLINIC, Disp: 120 tablet, Rfl: 1   ezetimibe  (ZETIA ) 10 MG tablet, Take 1 tablet (10 mg total) by mouth daily., Disp: 90 tablet, Rfl: 3   metoprolol  tartrate (LOPRESSOR ) 50 MG tablet, Take 1 tablet (50 mg total) by mouth once for 1 dose. Take 1 hour prior to test, Disp: 1 tablet, Rfl: 0   oseltamivir  (TAMIFLU ) 75 MG capsule, Take 1 capsule (75 mg total) by mouth daily. (Patient not taking: Reported on 06/13/2023), Disp: 10 capsule, Rfl: 0   triamterene-hydrochlorothiazide  (DYAZIDE) 37.5-25 MG capsule, Take 1 capsule by mouth daily as needed. (Patient not taking: Reported on 06/13/2023), Disp: , Rfl:  No Known Allergies   ROS: A complete ROS was performed with pertinent positives/negatives noted in the HPI. The remainder of the ROS are negative.    Objective:   Today's Vitals   06/13/23 0846  BP: 130/70  Pulse: 72  Temp: 97.6 F (36.4 C)  TempSrc: Temporal  SpO2: 97%  Weight: 144 lb 3.2 oz (65.4 kg)  Height: 5\' 8"  (1.727 m)    GENERAL: Well-appearing, in NAD. Well nourished.  SKIN: Pink, warm and dry. No rash. Spider veins BLE HEENT:    HEAD: Normocephalic, non-traumatic.  EYES: Conjunctive pink without exudate.  EARS: External ear w/o redness, swelling, masses, or lesions. EAC clear. TM's intact, translucent w/o bulging, appropriate landmarks visualized.  NECK: Trachea midline. Full ROM w/o pain or tenderness. No lymphadenopathy.  RESPIRATORY: Chest wall symmetrical. Respirations even and non-labored. Breath  sounds clear to auscultation bilaterally.  CARDIAC: S1, S2 present, regular rate and rhythm. Peripheral pulses 2+ bilaterally.  EXTREMITIES: Without clubbing, cyanosis, or edema.  NEUROLOGIC: No motor or sensory deficits. Steady, even gait.  PSYCH/MENTAL STATUS: Alert, oriented x 3. Cooperative, appropriate mood and affect.    No results found for any visits on 06/13/23.    Assessment & Plan:  Assessment and Plan    Pulsatile Tinnitus Reports a whooshing noise in the right ear, synchronized with the heartbeat. Differential diagnosis includes arteriovenous malformation or other blood vessel abnormalities causing the pulsatile tinnitus. BP WNL, no HTN. Hyperthyroidism considered a potential contributing factor. ENT evaluation necessary to rule out vascular abnormalities. - Check thyroid  levels to rule out hyperthyroidism. - Urgent referral to ENT for further evaluation, including potential need for imaging and audiometry.     No orders of  the defined types were placed in this encounter.  Orders Placed This Encounter  Procedures   Thyroid  Panel With TSH   Ambulatory referral to ENT    Referral Priority:   Urgent    Referral Type:   Consultation    Referral Reason:   Specialty Services Required    Referred to Provider:   Janita Mellow, MD    Requested Specialty:   Otolaryngology    Number of Visits Requested:   1   Lab Orders         Thyroid  Panel With TSH     No images are attached to the encounter or orders placed in the encounter.  Return if symptoms worsen or fail to improve.   Gavin Kast, FNP

## 2023-06-14 ENCOUNTER — Ambulatory Visit: Payer: Self-pay | Admitting: Internal Medicine

## 2023-06-14 LAB — THYROID PANEL WITH TSH
Free Thyroxine Index: 3 (ref 1.4–3.8)
T3 Uptake: 34 % (ref 22–35)
T4, Total: 8.7 ug/dL (ref 5.1–11.9)
TSH: 0.62 m[IU]/L (ref 0.40–4.50)

## 2023-06-28 ENCOUNTER — Encounter: Payer: Self-pay | Admitting: Nurse Practitioner

## 2023-06-28 ENCOUNTER — Ambulatory Visit: Payer: PRIVATE HEALTH INSURANCE | Admitting: Nurse Practitioner

## 2023-06-28 VITALS — BP 118/76 | HR 69 | Temp 97.8°F | Ht 67.72 in | Wt 147.8 lb

## 2023-06-28 DIAGNOSIS — M810 Age-related osteoporosis without current pathological fracture: Secondary | ICD-10-CM | POA: Diagnosis not present

## 2023-06-28 DIAGNOSIS — Z0001 Encounter for general adult medical examination with abnormal findings: Secondary | ICD-10-CM

## 2023-06-28 DIAGNOSIS — Z1211 Encounter for screening for malignant neoplasm of colon: Secondary | ICD-10-CM

## 2023-06-28 DIAGNOSIS — E782 Mixed hyperlipidemia: Secondary | ICD-10-CM

## 2023-06-28 DIAGNOSIS — R1314 Dysphagia, pharyngoesophageal phase: Secondary | ICD-10-CM

## 2023-06-28 LAB — COMPREHENSIVE METABOLIC PANEL WITH GFR
ALT: 12 U/L (ref 0–35)
AST: 15 U/L (ref 0–37)
Albumin: 4.1 g/dL (ref 3.5–5.2)
Alkaline Phosphatase: 39 U/L (ref 39–117)
BUN: 13 mg/dL (ref 6–23)
CO2: 33 meq/L — ABNORMAL HIGH (ref 19–32)
Calcium: 9.1 mg/dL (ref 8.4–10.5)
Chloride: 105 meq/L (ref 96–112)
Creatinine, Ser: 0.87 mg/dL (ref 0.40–1.20)
GFR: 71.08 mL/min (ref >60.00)
Glucose, Bld: 82 mg/dL (ref 70–99)
Potassium: 4 meq/L (ref 3.5–5.1)
Sodium: 142 meq/L (ref 135–145)
Total Bilirubin: 0.6 mg/dL (ref 0.2–1.2)
Total Protein: 6.3 g/dL (ref 6.0–8.3)

## 2023-06-28 LAB — CBC
HCT: 40.7 % (ref 36.0–46.0)
Hemoglobin: 13.4 g/dL (ref 12.0–15.0)
MCHC: 32.9 g/dL (ref 30.0–36.0)
MCV: 91.7 fl (ref 78.0–100.0)
Platelets: 229 10*3/uL (ref 150.0–400.0)
RBC: 4.44 Mil/uL (ref 3.87–5.11)
RDW: 13.3 % (ref 11.5–15.5)
WBC: 4.7 10*3/uL (ref 4.0–10.5)

## 2023-06-28 NOTE — Assessment & Plan Note (Addendum)
 Started boniva  2023 Last dexa scan 2024 by Dr. Leva Report requested

## 2023-06-28 NOTE — Assessment & Plan Note (Signed)
 Repeat lipid panel Under care of cardiology Currently takes only zetia . Unable to tolerate low dose atorvastatin . 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 She was informed to contact cardiology.  Advised to contact cardiology about this prescription

## 2023-06-28 NOTE — Progress Notes (Signed)
 Complete physical exam  Patient: Melinda Benton   DOB: July 05, 1960   63 y.o. Female  MRN: [id number] Visit Date: 06/28/2023  Subjective:    Chief Complaint  Patient presents with   Annual Exam    NOT FASTING    Melinda Benton is a 63 y.o. female who presents today for a complete physical exam. She reports consuming a general diet. Home exercise routine includes walking and weight training. She generally feels well. She reports sleeping well. She does have additional problems to discuss today.  Vision:Yes Dental:Yes STD Screen:No  BP Readings from Last 3 Encounters:  06/28/23 118/76  06/13/23 130/70  04/11/23 115/78   Wt Readings from Last 3 Encounters:  06/28/23 147 lb 12.8 oz (67 kg)  06/13/23 144 lb 3.2 oz (65.4 kg)  04/11/23 151 lb 6.4 oz (68.7 kg)   Most recent fall risk assessment:    06/28/2023    9:14 AM  Fall Risk   Falls in the past year? 0  Injury with Fall? 0  Risk for fall due to : No Fall Risks  Follow up Falls evaluation completed   Depression screen:Yes - No Depression Most recent depression screenings:    06/28/2023    9:14 AM 06/13/2023    8:50 AM  PHQ 2/9 Scores  PHQ - 2 Score 0 0  PHQ- 9 Score 0    GI Problem The primary symptoms include weight loss. Primary symptoms do not include fever, fatigue, abdominal pain, nausea, vomiting, diarrhea, melena, hematemesis, jaundice, hematochezia, dysuria, myalgias, arthralgias or rash. Primary symptoms comment: throat fullness, hoarseness. Episode onset: waxing and waning for several years. The onset was gradual. The problem has not changed since onset. The illness is also significant for constipation. The illness does not include chills, anorexia, dysphagia, odynophagia, bloating, tenesmus, back pain or itching. Associated medical issues do not include inflammatory bowel disease, GERD, gallstones, liver disease, alcohol abuse, PUD, gastric bypass, bowel resection, irritable bowel syndrome, hemorrhoids or  diverticulitis.    Age-related osteoporosis without current pathological fracture Started boniva  2023 Last dexa scan 2024 by Dr. Leva Report requested  Hyperlipidemia Repeat lipid panel Under care of cardiology Currently takes only zetia . Unable to tolerate low dose atorvastatin . 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 She was informed to contact cardiology.  Advised to contact cardiology about this prescription    Past Medical History:  Diagnosis Date   Arthritis    Clotting disorder (HCC)    Cough 05/19/2014   H/O blood clots    History of chicken pox    Migraines    Mitral valve prolapse    Thyroid  disease    Past Surgical History:  Procedure Laterality Date   bladder sur     CESAREAN SECTION     2   CHOLECYSTECTOMY N/A 07/07/2019   Procedure: LAPAROSCOPIC CHOLECYSTECTOMY WITH INTRAOPERATIVE CHOLANGIOGRAM;  Surgeon: Curvin Deward MOULD, MD;  Location: [facility];  Service: General;  Laterality: N/A;   ELBOW SURGERY  2009   knee repla Left 07/2016   partial knee replacement    ROTATOR CUFF REPAIR  2011   TONSILLECTOMY  ?1968   Social History   Socioeconomic History   Marital status: Married    Spouse name: Not on file   Number of children: 3   Years of education: 16   Highest education level: Not on file  Occupational History   Occupation: Technical brewer  Tobacco Use  Smoking status: Never   Smokeless tobacco: Never  Vaping Use   Vaping status: Never Used  Substance and Sexual Activity   Alcohol use: Yes    Comment: Socially   Drug use: No   Sexual activity: Yes  Other Topics Concern   Not on file  Social History Narrative   Regular exercise-yes   Caffeine Use-yes   Social Drivers of Health   Financial Resource Strain: Not on file  Food Insecurity: Not on file  Transportation Needs: Not on file  Physical Activity: Not on file  Stress: Not on file  Social Connections: Not on file   Intimate Partner Violence: Not on file   Family Status  Relation Name Status   Mother  Deceased   Father  Deceased   PGF  (Not Specified)   Brother  (Not Specified)   Bruna Nyhan  (Not Specified)   Bruna Brigham  (Not Specified)   PGM  (Not Specified)   Cousin  (Not Specified)  No partnership data on file   Family History  Problem Relation Age of Onset   Protein S deficiency Mother    Clotting disorder Mother    Heart disease Father    Diabetes Father    Headache Father    Colon cancer Paternal Grandfather    Stroke Brother    Diabetes Paternal Aunt    Diabetes Paternal Uncle    Colon cancer Paternal Grandmother 34   Colon cancer Cousin 35       paternal   No Known Allergies  Patient Care Team: Aayra Hornbaker, Roselie Rockford, NP as PCP - General (Internal Medicine) Waddell Danelle ORN, MD as PCP - Cardiology (Cardiology) Waddell Danelle ORN, MD as PCP - Electrophysiology (Cardiology) Tommas Pears, MD as Consulting Physician (Endocrinology)   Medications: Outpatient Medications Prior to Visit  Medication Sig   Alirocumab  (PRALUENT ) 150 MG/ML SOAJ Inject 1 mL (150 mg total) into the skin every 14 (fourteen) days.   buPROPion  (WELLBUTRIN  XL) 300 MG 24 hr tablet Take 1 tablet (300 mg total) by mouth daily. Need Office Visit for additional refills   ezetimibe  (ZETIA ) 10 MG tablet Take 1 tablet (10 mg total) by mouth daily.   ibandronate  (BONIVA ) 150 MG tablet Take 1 tablet (150 mg total) by mouth every 30 (thirty) days. Take in the morning with a full glass of water, on an empty stomach, and do not take anything else by mouth or lie down for the next 30 min.   levothyroxine  (SYNTHROID , LEVOTHROID) 88 MCG tablet Take 88 mcg by mouth daily before breakfast.   metoprolol  succinate (TOPROL  XL) 25 MG 24 hr tablet Take 1 tablet (25 mg total) by mouth daily as needed.   pantoprazole  (PROTONIX ) 20 MG tablet Take 1 tablet (20 mg total) by mouth daily as needed for heartburn or indigestion.    triamterene-hydrochlorothiazide (DYAZIDE) 37.5-25 MG capsule Take 1 capsule by mouth daily as needed.   warfarin (COUMADIN ) 6 MG tablet TAKE 6 MG BY MOUTH DAILY EXCEPT TAKE 3 MG ON MONDAY, WEDNESDAY AND SATURDAY OR AS DIRECTED BY ANTICOAGULATION CLINIC   [DISCONTINUED] oseltamivir  (TAMIFLU ) 75 MG capsule Take 1 capsule (75 mg total) by mouth daily.   [DISCONTINUED] metoprolol  tartrate (LOPRESSOR ) 50 MG tablet Take 1 tablet (50 mg total) by mouth once for 1 dose. Take 1 hour prior to test (Patient not taking: Reported on 06/28/2023)   No facility-administered medications prior to visit.    Review of Systems  Constitutional:  Positive for weight loss. Negative  for activity change, appetite change, chills, fatigue, fever and unexpected weight change.  Respiratory: Negative.    Cardiovascular: Negative.   Gastrointestinal:  Positive for constipation. Negative for abdominal pain, anorexia, bloating, diarrhea, dysphagia, hematemesis, hematochezia, jaundice, melena, nausea and vomiting.  Endocrine: Negative for cold intolerance and heat intolerance.  Genitourinary: Negative.  Negative for dysuria.  Musculoskeletal: Negative.  Negative for arthralgias, back pain and myalgias.  Skin: Negative.  Negative for itching and rash.  Neurological: Negative.   Hematological: Negative.   Psychiatric/Behavioral:  Negative for behavioral problems, decreased concentration, dysphoric mood, hallucinations, self-injury, sleep disturbance and suicidal ideas. The patient is not nervous/anxious.         Objective:  BP 118/76 (BP Location: Left Arm, Patient Position: Sitting, Cuff Size: Normal)   Pulse 69   Temp 97.8 F (36.6 C) (Oral)   Ht 5' 7.72 (1.72 m)   Wt 147 lb 12.8 oz (67 kg)   LMP 05/01/2012   SpO2 98%   BMI 22.66 kg/m     Physical Exam Vitals and nursing note reviewed.  Constitutional:      General: She is not in acute distress. HENT:     Right Ear: Tympanic membrane, ear canal and external  ear normal.     Left Ear: Tympanic membrane, ear canal and external ear normal.     Nose: Nose normal.   Eyes:     Extraocular Movements: Extraocular movements intact.     Conjunctiva/sclera: Conjunctivae normal.     Pupils: Pupils are equal, round, and reactive to light.   Neck:     Thyroid : No thyroid  mass, thyromegaly or thyroid  tenderness.   Cardiovascular:     Rate and Rhythm: Normal rate and regular rhythm.     Pulses: Normal pulses.     Heart sounds: Normal heart sounds.  Pulmonary:     Effort: Pulmonary effort is normal.     Breath sounds: Normal breath sounds.  Abdominal:     General: Bowel sounds are normal.     Palpations: Abdomen is soft.   Musculoskeletal:        General: Normal range of motion.     Cervical back: Normal range of motion and neck supple.     Right lower leg: No edema.     Left lower leg: No edema.  Lymphadenopathy:     Cervical: No cervical adenopathy.   Skin:    General: Skin is warm and dry.   Neurological:     Mental Status: She is alert and oriented to person, place, and time.     Cranial Nerves: No cranial nerve deficit.   Psychiatric:        Mood and Affect: Mood normal.        Behavior: Behavior normal.        Thought Content: Thought content normal.      Results for orders placed or performed in visit on 06/28/23  Comprehensive metabolic panel with GFR  Result Value Ref Range   Sodium 142 135 - 145 mEq/L   Potassium 4.0 3.5 - 5.1 mEq/L   Chloride 105 96 - 112 mEq/L   CO2 33 (H) 19 - 32 mEq/L   Glucose, Bld 82 70 - 99 mg/dL   BUN 13 6 - 23 mg/dL   Creatinine, Ser 9.12 0.40 - 1.20 mg/dL   Total Bilirubin 0.6 0.2 - 1.2 mg/dL   Alkaline Phosphatase 39 39 - 117 U/L   AST 15 0 - 37 U/L   ALT 12  0 - 35 U/L   Total Protein 6.3 6.0 - 8.3 g/dL   Albumin 4.1 3.5 - 5.2 g/dL   GFR 28.91 >39.99 mL/min   Calcium  9.1 8.4 - 10.5 mg/dL  CBC  Result Value Ref Range   WBC 4.7 4.0 - 10.5 K/uL   RBC 4.44 3.87 - 5.11 Mil/uL   Platelets  229.0 150.0 - 400.0 K/uL   Hemoglobin 13.4 12.0 - 15.0 g/dL   HCT 59.2 63.9 - 53.9 %   MCV 91.7 78.0 - 100.0 fl   MCHC 32.9 30.0 - 36.0 g/dL   RDW 86.6 88.4 - 84.4 %      Assessment & Plan:    Routine Health Maintenance and Physical Exam  Immunization History  Administered Date(s) Administered   Influenza,inj,Quad PF,6+ Mos 10/09/2013, 10/15/2018   Influenza-Unspecified 10/03/2017   Moderna Sars-Covid-2 Vaccination 03/20/2019, 04/17/2019, 12/24/2019   PFIZER(Purple Top)SARS-COV-2 Vaccination 12/24/2019   Tdap 01/03/2009, 05/07/2021   Zoster Recombinant(Shingrix ) 09/01/2021, 02/08/2022   Zoster, Live 02/08/2022   Health Maintenance  Topic Date Due   Colonoscopy  01/04/2023   COVID-19 Vaccine (5 - 2024-25 season) 06/29/2023 (Originally 09/04/2022)   INFLUENZA VACCINE  08/04/2023   MAMMOGRAM  12/10/2023   Cervical Cancer Screening (HPV/Pap Cotest)  12/11/2026   DTaP/Tdap/Td (3 - Td or Tdap) 05/08/2031   Hepatitis C Screening  Completed   HIV Screening  Completed   Zoster Vaccines- Shingrix   Completed   Hepatitis B Vaccines  Aged Out   HPV VACCINES  Aged Out   Meningococcal B Vaccine  Aged Out   Discussed health benefits of physical activity, and encouraged her to engage in regular exercise appropriate for her age and condition.  Problem List Items Addressed This Visit     Age-related osteoporosis without current pathological fracture   Started boniva  2023 Last dexa scan 2024 by Dr. Leva Report requested      Hyperlipidemia   Repeat lipid panel Under care of cardiology Currently takes only zetia . Unable to tolerate low dose atorvastatin . 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 She was informed to contact cardiology.  Advised to contact cardiology about this prescription        Other Visit Diagnoses       Encounter for preventative adult health care exam with abnormal findings    -  Primary   Relevant  Orders   Comprehensive metabolic panel with GFR (Completed)   CBC (Completed)     Colon cancer screening       Relevant Orders   Ambulatory referral to Gastroenterology     Pharyngoesophageal dysphagia       Relevant Orders   Ambulatory referral to Gastroenterology      Return in about 1 year (around 06/27/2024) for CPE (fasting).     Roselie Mood, NP

## 2023-06-28 NOTE — Patient Instructions (Signed)
 Go to lab Maintain Heart healthy diet and daily exercise. Maintain current medications. Schedule appointment with GI, cardiology, and endocrinology Try miralax for constipation

## 2023-06-29 ENCOUNTER — Ambulatory Visit: Payer: Self-pay | Admitting: Nurse Practitioner

## 2023-07-03 ENCOUNTER — Ambulatory Visit (INDEPENDENT_AMBULATORY_CARE_PROVIDER_SITE_OTHER): Payer: PRIVATE HEALTH INSURANCE

## 2023-07-03 DIAGNOSIS — Z7901 Long term (current) use of anticoagulants: Secondary | ICD-10-CM

## 2023-07-03 LAB — POCT INR: INR: 2.2 (ref 2.0–3.0)

## 2023-07-03 NOTE — Patient Instructions (Addendum)
 Pre visit review using our clinic review tool, if applicable. No additional management support is needed unless otherwise documented below in the visit note.  Continue 6mg  daily except take 3mg  on Mondays, Wednesdays and Saturdays. Recheck in 4 weeks, on 07/31/23.  Tried to contact

## 2023-07-03 NOTE — Progress Notes (Signed)
 Pt tests INR at home and submits results via [employer] portal. Pt submitted result yesterday.  INR is 2.2 Continue 6mg  daily except take 3mg  on Mondays, Wednesdays and Saturdays. Recheck in 4 weeks, on 07/31/23.  Tried to contact pt but no answer and VM is full. Sent a Social research officer, government with dosing and recheck date.

## 2023-07-04 ENCOUNTER — Telehealth: Payer: Self-pay

## 2023-07-04 ENCOUNTER — Other Ambulatory Visit (HOSPITAL_COMMUNITY): Payer: Self-pay

## 2023-07-04 NOTE — Telephone Encounter (Signed)
 It looks like it does.PA request has been Submitted. New Encounter has been or will be created for follow up. For additional info see Pharmacy Prior Auth telephone encounter from 07/04/23.

## 2023-07-04 NOTE — Telephone Encounter (Signed)
 Pharmacy Patient Advocate Encounter   Received notification from Physician's Office that prior authorization for PRALUENT  is required/requested.   Insurance verification completed.   The patient is insured through [facility] .   Per test claim: PA required; PA submitted to above mentioned insurance via CoverMyMeds Key/confirmation #/EOC [id number] Status is pending

## 2023-07-06 ENCOUNTER — Other Ambulatory Visit (HOSPITAL_COMMUNITY): Payer: Self-pay

## 2023-07-10 ENCOUNTER — Other Ambulatory Visit (HOSPITAL_COMMUNITY): Payer: Self-pay

## 2023-07-10 NOTE — Telephone Encounter (Signed)
 Pharmacy Patient Advocate Encounter  Received notification from [employer] that Prior Authorization for PRALUENT  has been DENIED.  Full denial letter will be uploaded to the media tab. See denial reason below.    Denied for not having any paid claims in the last 6 months for this drug.

## 2023-07-12 ENCOUNTER — Other Ambulatory Visit: Payer: Self-pay | Admitting: Endocrinology

## 2023-07-12 DIAGNOSIS — E041 Nontoxic single thyroid nodule: Secondary | ICD-10-CM

## 2023-07-14 ENCOUNTER — Ambulatory Visit
Admission: RE | Admit: 2023-07-14 | Discharge: 2023-07-14 | Disposition: A | Discharge status: Home / Self Care | Payer: PRIVATE HEALTH INSURANCE | Source: Ambulatory Visit | Attending: Endocrinology | Admitting: Endocrinology

## 2023-07-14 DIAGNOSIS — E041 Nontoxic single thyroid nodule: Secondary | ICD-10-CM

## 2023-07-20 ENCOUNTER — Other Ambulatory Visit (HOSPITAL_COMMUNITY): Payer: Self-pay

## 2023-08-02 ENCOUNTER — Ambulatory Visit (INDEPENDENT_AMBULATORY_CARE_PROVIDER_SITE_OTHER): Payer: PRIVATE HEALTH INSURANCE

## 2023-08-02 DIAGNOSIS — Z7901 Long term (current) use of anticoagulants: Secondary | ICD-10-CM

## 2023-08-02 LAB — POCT INR: INR: 2.3 (ref 2.0–3.0)

## 2023-08-02 NOTE — Progress Notes (Signed)
 Pt tests INR at home and submits results via [employer] portal.  INR is 2.3 today. Continue 6mg  daily except take 3mg  on Mondays, Wednesdays and Saturdays. Recheck in 4 weeks, on 08/29/23.  LVM with dosing and recheck date.

## 2023-08-02 NOTE — Patient Instructions (Addendum)
 Pre visit review using our clinic review tool, if applicable. No additional management support is needed unless otherwise documented below in the visit note.  Continue 6mg  daily except take 3mg  on Mondays, Wednesdays and Saturdays. Recheck in 4 weeks, on 08/29/23.

## 2023-08-07 NOTE — Telephone Encounter (Signed)
 There is labs from endocrinology from 7/30 in care everywhere with LDL-C 114. LDL-C 162 in May.  I called and spoke with patient.  She had not been taking Praluent .  She states that she just did not realize that once her cholesterol was stable that she needed to continue to take it.  We will try to resubmit as a new start using her labs from endocrinology.  I will wait for her to confirm what other cholesterol medication she is taking.  Awaiting response to MyChart message as patient was driving and not at home and was not 100% sure what she was taking.

## 2023-08-08 ENCOUNTER — Ambulatory Visit: Payer: PRIVATE HEALTH INSURANCE | Attending: Orthopedic Surgery

## 2023-08-08 ENCOUNTER — Telehealth: Payer: Self-pay

## 2023-08-08 DIAGNOSIS — M6281 Muscle weakness (generalized): Secondary | ICD-10-CM | POA: Insufficient documentation

## 2023-08-08 DIAGNOSIS — M7061 Trochanteric bursitis, right hip: Secondary | ICD-10-CM | POA: Insufficient documentation

## 2023-08-08 DIAGNOSIS — M25551 Pain in right hip: Secondary | ICD-10-CM | POA: Insufficient documentation

## 2023-08-08 DIAGNOSIS — M25552 Pain in left hip: Secondary | ICD-10-CM | POA: Insufficient documentation

## 2023-08-08 DIAGNOSIS — M7062 Trochanteric bursitis, left hip: Secondary | ICD-10-CM | POA: Insufficient documentation

## 2023-08-08 NOTE — Telephone Encounter (Signed)
 Pharmacy Patient Advocate Encounter   Received notification from Physician's Office that prior authorization for PRALUENT  is required/requested.   Insurance verification completed.   The patient is insured through [facility] .   Per test claim: PA required; PA submitted to above mentioned insurance via CoverMyMeds Key/confirmation #/EOC [id number] Status is pending

## 2023-08-08 NOTE — Telephone Encounter (Signed)
 PA request has been Submitted. New Encounter has been or will be created for follow up. For additional info see Pharmacy Prior Auth telephone encounter from 08/08/23.

## 2023-08-08 NOTE — Patient Instructions (Signed)

## 2023-08-08 NOTE — Therapy (Signed)
 OUTPATIENT PHYSICAL THERAPY LOWER EXTREMITY EVALUATION   Patient Name: Melinda Benton MRN: [id number] DOB:Sep 13, 1960, 63 y.o., female Today's Date: 08/08/2023  END OF SESSION:  PT End of Session - 08/08/23 1102     Visit Number 1    Date for PT Re-Evaluation 10/31/23    Authorization Type Generic Commercial    PT Start Time 1103    PT Stop Time 1150    PT Time Calculation (min) 47 min          Past Medical History:  Diagnosis Date   Arthritis    Clotting disorder (HCC)    Cough 05/19/2014   H/O blood clots    History of chicken pox    Migraines    Mitral valve prolapse    Thyroid  disease    Past Surgical History:  Procedure Laterality Date   bladder sur     CESAREAN SECTION     2   CHOLECYSTECTOMY N/A 07/07/2019   Procedure: LAPAROSCOPIC CHOLECYSTECTOMY WITH INTRAOPERATIVE CHOLANGIOGRAM;  Surgeon: Curvin Deward MOULD, MD;  Location: [facility];  Service: General;  Laterality: N/A;   ELBOW SURGERY  2009   knee repla Left 07/2016   partial knee replacement    ROTATOR CUFF REPAIR  2011   TONSILLECTOMY  ?1968   Patient Active Problem List   Diagnosis Date Noted   Atypical atrial flutter (HCC) 04/27/2022   Bilateral hip pain 04/27/2022   Age-related osteoporosis without current pathological fracture 05/07/2021   Protein S deficiency (HCC) 11/24/2020   Family history of diabetes mellitus 04/21/2020   Hyperlipidemia 04/21/2020   Nontoxic single thyroid  nodule 04/21/2020   Closed nondisplaced fracture of proximal phalanx of lesser toe of right foot 02/18/2020   Closed nondisplaced fracture of proximal phalanx of lesser toe of left foot 12/02/2019   S/P laparoscopic cholecystectomy 07/11/2019   Family history of malignant neoplasm of colon 11/05/2018   Hypothyroidism 11/05/2018   Chronic left shoulder pain 03/06/2018   GAD (generalized anxiety disorder) 03/20/2017   S/P left unicompartmental knee replacement 07/19/2016   Chronic pain of right knee 06/07/2016   Spider  varicose veins 09/01/2015   Palpitations 10/29/2013   Arthritis 10/09/2013   Anticoagulant long-term use 08/04/2010   History of pulmonary embolism 02/15/2010   Hypercoagulable state (HCC) 02/15/2010    PCP: Roselie Mood  REFERRING PROVIDER: [name]  REFERRING DIAG: B troch bursitis  THERAPY DIAG:  Bilateral hip pain  Trochanteric bursitis of both hips  Muscle weakness (generalized)  Rationale for Evaluation and Treatment: Rehabilitation  ONSET DATE: years, it is chronic   SUBJECTIVE:   SUBJECTIVE STATEMENT: I have bursitis and been having injections. Last one was is May and lasted about 3 weeks. I have been doing home exercises since January and lost weight. But I realized the exercises cause pain. I am going hiking in [place] at the end of this month and am kind of nervous about it.   PERTINENT HISTORY: See above   PAIN:  Are you having pain? Yes: NPRS scale: 7-8/10 Pain location: bilateral hips radiates down into thighs  Pain description: achy, constant   Aggravating factors: stretches, walking long distances Relieving factors: Voltaren   PRECAUTIONS: None  RED FLAGS: None   WEIGHT BEARING RESTRICTIONS: No  FALLS:  Has patient fallen in last 6 months? No  LIVING ENVIRONMENT: Lives with: lives with their spouse Lives in: House/apartment Stairs: Yes: External: 3 steps; none (has steps inside but master is on the main floor so doesn't have to manage)  Has following equipment at home: Vannie - 2 wheeled  OCCUPATION: self employed  PLOF: Independent, Independent with basic ADLs, Independent with gait, and Independent with transfers  PATIENT GOALS: get rid of pain   NEXT MD VISIT:   OBJECTIVE:  Note: Objective measures were completed at Evaluation unless otherwise noted.  DIAGNOSTIC FINDINGS: hip x-rays were clear   COGNITION: Overall cognitive status: Within functional limits for tasks assessed     SENSATION: WFL   POSTURE: No  Significant postural limitations  PALPATION: TTP bilateral glutes, piriformis, and along lateral hips into ITB  LOWER EXTREMITY ROM: pain with L side hip flexion, some pain in L knee, all other WFL   LOWER EXTREMITY MMT:  MMT Right eval Left eval  Hip flexion 5 3-  Hip extension    Hip abduction    Hip adduction    Hip internal rotation    Hip external rotation    Knee flexion 5 5  Knee extension 5 3  Ankle dorsiflexion    Ankle plantarflexion    Ankle inversion    Ankle eversion     (Blank rows = not tested)  FUNCTIONAL TESTS:  5 times sit to stand: 15.50s                                                                                                                                 TREATMENT DATE:  08/08/23 Eval Trigger Point Dry Needling  Initial Treatment: Pt instructed on Dry Needling rational, procedures, and possible side effects. Pt instructed to expect mild to moderate muscle soreness later in the day and/or into the next day.  Pt instructed in methods to reduce muscle soreness. Pt instructed to continue prescribed HEP. Patient was educated on signs and symptoms of infection and other risk factors and advised to seek medical attention should they occur.  Patient verbalized understanding of these instructions and education.   Patient Verbal Consent Given: Yes Education Handout Provided: Yes Muscles Treated: glutes Treatment Response/Outcome: LTR's Performed by CHRISTELLA Mainland, PT      PATIENT EDUCATION:  Education details: POC, DN, avoiding activities that hurt Person educated: Patient Education method: Explanation Education comprehension: verbalized understanding  HOME EXERCISE PROGRAM: Piriformis and glute stretch   ASSESSMENT:  CLINICAL IMPRESSION: Patient is a 63 y.o. female who was seen today for physical therapy evaluation and treatment for bilateral hip pain. She reports bursitis on both sides and pain radiating into her thighs. She also has a  sharp pain in the L groin area. Pt presents with weakness in the L hip flexors and quad. She is pretty active but reports some stretches and exercises can increase her pain. She is going to the [place] at the end of August and is nervous that she may not be able to do it due to her pain. Patient may be experiencing some gluteal tendinopathy and has tightness in the glute and piriformis. She is TTP on  both sides in the glutes and along ITB. Patient will benefit from PT to address her weakness and pain to allow her to be able to continue to be active and hike without difficulty.   OBJECTIVE IMPAIRMENTS: Abnormal gait, decreased strength, and pain.   ACTIVITY LIMITATIONS: locomotion level  PARTICIPATION LIMITATIONS: community activity  PERSONAL FACTORS: Time since onset of injury/illness/exacerbation are also affecting patient's functional outcome.   REHAB POTENTIAL: Good  CLINICAL DECISION MAKING: Stable/uncomplicated  EVALUATION COMPLEXITY: Low  GOALS: Goals reviewed with patient? Yes  SHORT TERM GOALS: Target date: 09/19/23  Patient will be independent with initial HEP. Baseline:  Goal status: INITIAL   LONG TERM GOALS: Target date: 10/31/23  Patient will be independent with advanced/ongoing HEP to improve outcomes and carryover.  Baseline:  Goal status: INITIAL  2.  Patient will report at least 75% improvement in bilateral hip pain to improve QOL. (<3/10) Baseline: 7-8/10 Goal status: INITIAL  3.  Patient will demonstrate improved functional LLE strength as demonstrated by 5/5. Baseline: hip flexor and knee ext 3-/5 Goal status: INITIAL  4.  Patient will be able to complete exercises classes and do stretches without pain  Baseline:  Goal status: INITIAL   PLAN:  PT FREQUENCY: 1-2x/week  PT DURATION: 12 weeks  PLANNED INTERVENTIONS: 97110-Therapeutic exercises, 97530- Therapeutic activity, 97112- Neuromuscular re-education, 97535- Self Care, 02859- Manual  therapy, G0283- Electrical stimulation (unattended), 97035- Ultrasound, 02966- Ionotophoresis 4mg /ml Dexamethasone , 79439 (1-2 muscles), 20561 (3+ muscles)- Dry Needling, Patient/Family education, Balance training, Stair training, Taping, Joint mobilization, Joint manipulation, Spinal manipulation, Spinal mobilization, Cryotherapy, and Moist heat  PLAN FOR NEXT SESSION: DN, ionto patch, stretching    Alera Quevedo, PT 08/08/2023, 11:56 AM

## 2023-08-08 NOTE — Addendum Note (Signed)
 Addended by: Britney Newstrom D on: 08/08/2023 08:51 AM   Modules accepted: Orders

## 2023-08-08 NOTE — Telephone Encounter (Signed)
 Patient is taking atorvastatin  10mg  daily. Not able to tolerate higher doses. LDL-C 114 on 08/02/23. Can we see if we are able to do this PA as a new start since she hasn't been on Praluent  for >60 months?

## 2023-08-09 ENCOUNTER — Ambulatory Visit: Payer: PRIVATE HEALTH INSURANCE | Admitting: Physical Therapy

## 2023-08-09 ENCOUNTER — Encounter: Payer: Self-pay | Admitting: Physical Therapy

## 2023-08-09 DIAGNOSIS — M6281 Muscle weakness (generalized): Secondary | ICD-10-CM | POA: Diagnosis present

## 2023-08-09 DIAGNOSIS — M25551 Pain in right hip: Secondary | ICD-10-CM | POA: Diagnosis present

## 2023-08-09 DIAGNOSIS — M7062 Trochanteric bursitis, left hip: Secondary | ICD-10-CM | POA: Diagnosis present

## 2023-08-09 DIAGNOSIS — M7061 Trochanteric bursitis, right hip: Secondary | ICD-10-CM | POA: Diagnosis present

## 2023-08-09 DIAGNOSIS — M25552 Pain in left hip: Secondary | ICD-10-CM | POA: Diagnosis present

## 2023-08-09 NOTE — Telephone Encounter (Signed)
 Plan is still denying PA request even as new start.   Please see chart media.

## 2023-08-09 NOTE — Telephone Encounter (Signed)
 Scanned in media.

## 2023-08-09 NOTE — Therapy (Signed)
 OUTPATIENT PHYSICAL THERAPY LOWER EXTREMITY TREATMENT    Patient Name: Melinda Benton MRN: [id number] DOB:01-08-1960, 63 y.o., female Today's Date: 08/09/2023  END OF SESSION:  PT End of Session - 08/09/23 1345     Visit Number 2    Date for PT Re-Evaluation 10/31/23    Authorization Type Generic Commercial    PT Start Time 1302    PT Stop Time 1341    PT Time Calculation (min) 39 min    Activity Tolerance Patient tolerated treatment well    Behavior During Therapy [facility] for tasks assessed/performed           Past Medical History:  Diagnosis Date   Arthritis    Clotting disorder (HCC)    Cough 05/19/2014   H/O blood clots    History of chicken pox    Migraines    Mitral valve prolapse    Thyroid  disease    Past Surgical History:  Procedure Laterality Date   bladder sur     CESAREAN SECTION     2   CHOLECYSTECTOMY N/A 07/07/2019   Procedure: LAPAROSCOPIC CHOLECYSTECTOMY WITH INTRAOPERATIVE CHOLANGIOGRAM;  Surgeon: Curvin Deward MOULD, MD;  Location: [facility];  Service: General;  Laterality: N/A;   ELBOW SURGERY  2009   knee repla Left 07/2016   partial knee replacement    ROTATOR CUFF REPAIR  2011   TONSILLECTOMY  ?1968   Patient Active Problem List   Diagnosis Date Noted   Atypical atrial flutter (HCC) 04/27/2022   Bilateral hip pain 04/27/2022   Age-related osteoporosis without current pathological fracture 05/07/2021   Protein S deficiency (HCC) 11/24/2020   Family history of diabetes mellitus 04/21/2020   Hyperlipidemia 04/21/2020   Nontoxic single thyroid  nodule 04/21/2020   Closed nondisplaced fracture of proximal phalanx of lesser toe of right foot 02/18/2020   Closed nondisplaced fracture of proximal phalanx of lesser toe of left foot 12/02/2019   S/P laparoscopic cholecystectomy 07/11/2019   Family history of malignant neoplasm of colon 11/05/2018   Hypothyroidism 11/05/2018   Chronic left shoulder pain 03/06/2018   GAD (generalized anxiety disorder)  03/20/2017   S/P left unicompartmental knee replacement 07/19/2016   Chronic pain of right knee 06/07/2016   Spider varicose veins 09/01/2015   Palpitations 10/29/2013   Arthritis 10/09/2013   Anticoagulant long-term use 08/04/2010   History of pulmonary embolism 02/15/2010   Hypercoagulable state (HCC) 02/15/2010    PCP: Roselie Mood  REFERRING PROVIDER: [name]  REFERRING DIAG: B troch bursitis  THERAPY DIAG:  Bilateral hip pain  Trochanteric bursitis of both hips  Muscle weakness (generalized)  Rationale for Evaluation and Treatment: Rehabilitation  ONSET DATE: years, it is chronic   SUBJECTIVE:   SUBJECTIVE STATEMENT:   Had needling done yesterday, it was amazing but still sore.   EVAL: I have bursitis and been having injections. Last one was is May and lasted about 3 weeks. I have been doing home exercises since January and lost weight. But I realized the exercises cause pain. I am going hiking in [place] at the end of this month and am kind of nervous about it.   PERTINENT HISTORY: See above   PAIN:  Are you having pain? Yes: NPRS scale: 3/10 Pain location: bilateral hips  Pain description: achy, constant   Aggravating factors: stretches, walking long distances Relieving factors: Voltaren , DN   PRECAUTIONS: None  RED FLAGS: None   WEIGHT BEARING RESTRICTIONS: No  FALLS:  Has patient fallen in last 6 months?  No  LIVING ENVIRONMENT: Lives with: lives with their spouse Lives in: House/apartment Stairs: Yes: External: 3 steps; none (has steps inside but master is on the main floor so doesn't have to manage) Has following equipment at home: Vannie - 2 wheeled  OCCUPATION: self employed  PLOF: Independent, Independent with basic ADLs, Independent with gait, and Independent with transfers  PATIENT GOALS: get rid of pain   NEXT MD VISIT:   OBJECTIVE:  Note: Objective measures were completed at Evaluation unless otherwise  noted.  DIAGNOSTIC FINDINGS: hip x-rays were clear   COGNITION: Overall cognitive status: Within functional limits for tasks assessed     SENSATION: WFL   POSTURE: No Significant postural limitations  PALPATION: TTP bilateral glutes, piriformis, and along lateral hips into ITB  LOWER EXTREMITY ROM: pain with L side hip flexion, some pain in L knee, all other WFL   LOWER EXTREMITY MMT:  MMT Right eval Left eval  Hip flexion 5 3-  Hip extension    Hip abduction    Hip adduction    Hip internal rotation    Hip external rotation    Knee flexion 5 5  Knee extension 5 3  Ankle dorsiflexion    Ankle plantarflexion    Ankle inversion    Ankle eversion     (Blank rows = not tested)  FUNCTIONAL TESTS:  5 times sit to stand: 15.50s                                                                                                                                 TREATMENT DATE:    08/09/23 Trigger Point Dry Needling  Subsequent Treatment: Instructions provided previously at initial dry needling treatment.   Patient Verbal Consent Given: Yes Education Handout Provided: Previously Provided Muscles Treated: glutes Treatment Response/Outcome: LTR   Dry needling as documented by Ozell Mainland PT  Supine figure 4 stretch 3x30 seconds B Single leg bridge x10  Sidelying clams red TB x10 B Hip hikes x10 B  Nustep L5x6 minutes BLEs only seat 11    08/08/23 Eval Trigger Point Dry Needling  Initial Treatment: Pt instructed on Dry Needling rational, procedures, and possible side effects. Pt instructed to expect mild to moderate muscle soreness later in the day and/or into the next day.  Pt instructed in methods to reduce muscle soreness. Pt instructed to continue prescribed HEP. Patient was educated on signs and symptoms of infection and other risk factors and advised to seek medical attention should they occur.  Patient verbalized understanding of these instructions  and education.   Patient Verbal Consent Given: Yes Education Handout Provided: Yes Muscles Treated: glutes Treatment Response/Outcome: LTR's Performed by CHRISTELLA Mainland, PT      PATIENT EDUCATION:  Education details: POC, DN, avoiding activities that hurt Person educated: Patient Education method: Explanation Education comprehension: verbalized understanding  HOME EXERCISE PROGRAM:  Access Code: [email or web address] URL: [email or web address] Date: 08/09/2023 Prepared by: Josette Rough  Exercises - Supine Figure 4 Piriformis Stretch  - 2 x daily - 7 x weekly - 1 sets - 3 reps - 30 seconds  hold - Figure 4 Bridge  - 1 x daily - 7 x weekly - 2 sets - 10 reps - Clamshell with Resistance  - 1 x daily - 7 x weekly - 2 sets - 10 reps - Standing Hip Hiking  - 1 x daily - 7 x weekly - 2 sets - 10 reps    ASSESSMENT:  CLINICAL IMPRESSION:  Continued with dry needling as she found this very helpful- otherwise worked on flexibility and strengthening as appropriate today. Gave initial HEP as well.    EVAL: Patient is a 63 y.o. female who was seen today for physical therapy evaluation and treatment for bilateral hip pain. She reports bursitis on both sides and pain radiating into her thighs. She also has a sharp pain in the L groin area. Pt presents with weakness in the L hip flexors and quad. She is pretty active but reports some stretches and exercises can increase her pain. She is going to [place] at the end of August and is nervous that she may not be able to do it due to her pain. Patient may be experiencing some gluteal tendinopathy and has tightness in the glute and piriformis. She is TTP on both sides in the glutes and along ITB. Patient will benefit from PT to address her weakness and pain to allow her to be able to continue to be active and hike without difficulty.   OBJECTIVE IMPAIRMENTS: Abnormal gait, decreased strength, and pain.   ACTIVITY LIMITATIONS: locomotion  level  PARTICIPATION LIMITATIONS: community activity  PERSONAL FACTORS: Time since onset of injury/illness/exacerbation are also affecting patient's functional outcome.   REHAB POTENTIAL: Good  CLINICAL DECISION MAKING: Stable/uncomplicated  EVALUATION COMPLEXITY: Low  GOALS: Goals reviewed with patient? Yes  SHORT TERM GOALS: Target date: 09/19/23  Patient will be independent with initial HEP. Baseline:  Goal status: INITIAL   LONG TERM GOALS: Target date: 10/31/23  Patient will be independent with advanced/ongoing HEP to improve outcomes and carryover.  Baseline:  Goal status: INITIAL  2.  Patient will report at least 75% improvement in bilateral hip pain to improve QOL. (<3/10) Baseline: 7-8/10 Goal status: INITIAL  3.  Patient will demonstrate improved functional LLE strength as demonstrated by 5/5. Baseline: hip flexor and knee ext 3-/5 Goal status: INITIAL  4.  Patient will be able to complete exercises classes and do stretches without pain  Baseline:  Goal status: INITIAL   PLAN:  PT FREQUENCY: 1-2x/week  PT DURATION: 12 weeks  PLANNED INTERVENTIONS: 97110-Therapeutic exercises, 97530- Therapeutic activity, 97112- Neuromuscular re-education, 97535- Self Care, 02859- Manual therapy, G0283- Electrical stimulation (unattended), 97035- Ultrasound, 02966- Ionotophoresis 4mg /ml Dexamethasone , 79439 (1-2 muscles), 20561 (3+ muscles)- Dry Needling, Patient/Family education, Balance training, Stair training, Taping, Joint mobilization, Joint manipulation, Spinal manipulation, Spinal mobilization, Cryotherapy, and Moist heat  PLAN FOR NEXT SESSION: DN, ionto patch, stretching, strengthening    Josette Rough, PT, DPT 08/09/23 1:46 PM

## 2023-08-15 ENCOUNTER — Encounter: Payer: Self-pay | Admitting: Physical Therapy

## 2023-08-15 ENCOUNTER — Ambulatory Visit: Payer: PRIVATE HEALTH INSURANCE | Admitting: Physical Therapy

## 2023-08-15 DIAGNOSIS — M25551 Pain in right hip: Secondary | ICD-10-CM | POA: Diagnosis not present

## 2023-08-15 DIAGNOSIS — M7061 Trochanteric bursitis, right hip: Secondary | ICD-10-CM

## 2023-08-15 DIAGNOSIS — M6281 Muscle weakness (generalized): Secondary | ICD-10-CM

## 2023-08-15 NOTE — Therapy (Signed)
 OUTPATIENT PHYSICAL THERAPY LOWER EXTREMITY TREATMENT    Patient Name: LAQUANTA HUMMEL MRN: [id number] DOB:[birth year], 63 y.o., female Today's Date: 08/15/2023  END OF SESSION:  PT End of Session - 08/15/23 1127     Visit Number 3    Date for PT Re-Evaluation 10/31/23    Authorization Type Generic Commercial    PT Start Time 1101    PT Stop Time 1142    PT Time Calculation (min) 41 min    Activity Tolerance Patient tolerated treatment well    Behavior During Therapy [facility] for tasks assessed/performed            Past Medical History:  Diagnosis Date   Arthritis    Clotting disorder (HCC)    Cough 05/19/2014   H/O blood clots    History of chicken pox    Migraines    Mitral valve prolapse    Thyroid  disease    Past Surgical History:  Procedure Laterality Date   bladder sur     CESAREAN SECTION     2   CHOLECYSTECTOMY N/A 07/07/2019   Procedure: LAPAROSCOPIC CHOLECYSTECTOMY WITH INTRAOPERATIVE CHOLANGIOGRAM;  Surgeon: Curvin Deward MOULD, MD;  Location: [facility];  Service: General;  Laterality: N/A;   ELBOW SURGERY  2009   knee repla Left 07/2016   partial knee replacement    ROTATOR CUFF REPAIR  2011   TONSILLECTOMY  ?1968   Patient Active Problem List   Diagnosis Date Noted   Atypical atrial flutter (HCC) 04/27/2022   Bilateral hip pain 04/27/2022   Age-related osteoporosis without current pathological fracture 05/07/2021   Protein S deficiency (HCC) 11/24/2020   Family history of diabetes mellitus 04/21/2020   Hyperlipidemia 04/21/2020   Nontoxic single thyroid  nodule 04/21/2020   Closed nondisplaced fracture of proximal phalanx of lesser toe of right foot 02/18/2020   Closed nondisplaced fracture of proximal phalanx of lesser toe of left foot 12/02/2019   S/P laparoscopic cholecystectomy 07/11/2019   Family history of malignant neoplasm of colon 11/05/2018   Hypothyroidism 11/05/2018   Chronic left shoulder pain 03/06/2018   GAD (generalized anxiety disorder)  03/20/2017   S/P left unicompartmental knee replacement 07/19/2016   Chronic pain of right knee 06/07/2016   Spider varicose veins 09/01/2015   Palpitations 10/29/2013   Arthritis 10/09/2013   Anticoagulant long-term use 08/04/2010   History of pulmonary embolism 02/15/2010   Hypercoagulable state (HCC) 02/15/2010    PCP: Roselie Mood  REFERRING PROVIDER: [name]  REFERRING DIAG: B troch bursitis  THERAPY DIAG:  Bilateral hip pain  Trochanteric bursitis of both hips  Muscle weakness (generalized)  Rationale for Evaluation and Treatment: Rehabilitation  ONSET DATE: years, it is chronic   SUBJECTIVE:   SUBJECTIVE STATEMENT:  Have been feeling good since last time, nothing really new. Felt more strength and flexibility with the HEP, I could barely pick my leg up before. Needles + strengthening are really helpful   EVAL: I have bursitis and been having injections. Last one was is May and lasted about 3 weeks. I have been doing home exercises since January and lost weight. But I realized the exercises cause pain. I am going hiking in [place] at the end of this month and am kind of nervous about it.   PERTINENT HISTORY: See above   PAIN:  Are you having pain? Yes: NPRS scale: 4/10 Pain location: bilateral hips  Pain description: achy, constant   Aggravating factors: stretches, walking long distances Relieving factors: Voltaren , DN  PRECAUTIONS: None  RED FLAGS: None   WEIGHT BEARING RESTRICTIONS: No  FALLS:  Has patient fallen in last 6 months? No  LIVING ENVIRONMENT: Lives with: lives with their spouse Lives in: House/apartment Stairs: Yes: External: 3 steps; none (has steps inside but master is on the main floor so doesn't have to manage) Has following equipment at home: Vannie - 2 wheeled  OCCUPATION: self employed  PLOF: Independent, Independent with basic ADLs, Independent with gait, and Independent with transfers  PATIENT GOALS: get rid  of pain   NEXT MD VISIT:   OBJECTIVE:  Note: Objective measures were completed at Evaluation unless otherwise noted.  DIAGNOSTIC FINDINGS: hip x-rays were clear   COGNITION: Overall cognitive status: Within functional limits for tasks assessed     SENSATION: WFL   POSTURE: No Significant postural limitations  PALPATION: TTP bilateral glutes, piriformis, and along lateral hips into ITB  LOWER EXTREMITY ROM: pain with L side hip flexion, some pain in L knee, all other WFL   LOWER EXTREMITY MMT:  MMT Right eval Left eval  Hip flexion 5 3-  Hip extension    Hip abduction    Hip adduction    Hip internal rotation    Hip external rotation    Knee flexion 5 5  Knee extension 5 3  Ankle dorsiflexion    Ankle plantarflexion    Ankle inversion    Ankle eversion     (Blank rows = not tested)  FUNCTIONAL TESTS:  5 times sit to stand: 15.50s                                                                                                                                 TREATMENT DATE:    08/15/23  Trigger Point Dry Needling  Subsequent Treatment: Instructions provided previously at initial dry needling treatment.   Patient Verbal Consent Given: Yes Education Handout Provided: Previously Provided Muscles Treated: glutes Treatment Response/Outcome: LTR   Dry needling as documented by Ozell Mainland PT  Walking bridges x10 Sidelying hip ABD red TB x10 B Squats to top of mat red TB around knees x10 Hip hikes x12 B Hip hikes + swing x10 B Wall sits 10x10 seconds knees aligned over toes   Nustep L6x6 min BLEs only seat 11      08/09/23 Trigger Point Dry Needling  Subsequent Treatment: Instructions provided previously at initial dry needling treatment.   Patient Verbal Consent Given: Yes Education Handout Provided: Previously Provided Muscles Treated: glutes Treatment Response/Outcome: LTR   Dry needling as documented by Ozell Mainland PT  Supine  figure 4 stretch 3x30 seconds B Single leg bridge x10  Sidelying clams red TB x10 B Hip hikes x10 B  Nustep L5x6 minutes BLEs only seat 11    08/08/23 Eval Trigger Point Dry Needling  Initial Treatment: Pt instructed on Dry Needling rational, procedures, and possible side effects. Pt instructed to expect mild to moderate muscle  soreness later in the day and/or into the next day.  Pt instructed in methods to reduce muscle soreness. Pt instructed to continue prescribed HEP. Patient was educated on signs and symptoms of infection and other risk factors and advised to seek medical attention should they occur.  Patient verbalized understanding of these instructions and education.   Patient Verbal Consent Given: Yes Education Handout Provided: Yes Muscles Treated: glutes Treatment Response/Outcome: LTR's Performed by CHRISTELLA Mainland, PT      PATIENT EDUCATION:  Education details: POC, DN, avoiding activities that hurt Person educated: Patient Education method: Explanation Education comprehension: verbalized understanding  HOME EXERCISE PROGRAM:  Access Code: [email or web address] URL: [email or web address] Date: 08/09/2023 Prepared by: Josette Rough  Exercises - Supine Figure 4 Piriformis Stretch  - 2 x daily - 7 x weekly - 1 sets - 3 reps - 30 seconds  hold - Figure 4 Bridge  - 1 x daily - 7 x weekly - 2 sets - 10 reps - Clamshell with Resistance  - 1 x daily - 7 x weekly - 2 sets - 10 reps - Standing Hip Hiking  - 1 x daily - 7 x weekly - 2 sets - 10 reps    ASSESSMENT:  CLINICAL IMPRESSION:  Arrives today continuing to report good results from combo of strength and needling- continued with this approach with strength progressions as tolerated.    EVAL: Patient is a 63 y.o. female who was seen today for physical therapy evaluation and treatment for bilateral hip pain. She reports bursitis on both sides and pain radiating into her thighs. She also has a sharp pain in the  L groin area. Pt presents with weakness in the L hip flexors and quad. She is pretty active but reports some stretches and exercises can increase her pain. She is going to the [place] for hiking at the end of August and is nervous that she may not be able to do it due to her pain. Patient may be experiencing some gluteal tendinopathy and has tightness in the glute and piriformis. She is TTP on both sides in the glutes and along ITB. Patient will benefit from PT to address her weakness and pain to allow her to be able to continue to be active and hike without difficulty.   OBJECTIVE IMPAIRMENTS: Abnormal gait, decreased strength, and pain.   ACTIVITY LIMITATIONS: locomotion level  PARTICIPATION LIMITATIONS: community activity  PERSONAL FACTORS: Time since onset of injury/illness/exacerbation are also affecting patient's functional outcome.   REHAB POTENTIAL: Good  CLINICAL DECISION MAKING: Stable/uncomplicated  EVALUATION COMPLEXITY: Low  GOALS: Goals reviewed with patient? Yes  SHORT TERM GOALS: Target date: 09/19/23  Patient will be independent with initial HEP. Baseline:  Goal status: INITIAL   LONG TERM GOALS: Target date: 10/31/23  Patient will be independent with advanced/ongoing HEP to improve outcomes and carryover.  Baseline:  Goal status: INITIAL  2.  Patient will report at least 75% improvement in bilateral hip pain to improve QOL. (<3/10) Baseline: 7-8/10 Goal status: INITIAL  3.  Patient will demonstrate improved functional LLE strength as demonstrated by 5/5. Baseline: hip flexor and knee ext 3-/5 Goal status: INITIAL  4.  Patient will be able to complete exercises classes and do stretches without pain  Baseline:  Goal status: INITIAL   PLAN:  PT FREQUENCY: 1-2x/week  PT DURATION: 12 weeks  PLANNED INTERVENTIONS: 97110-Therapeutic exercises, 97530- Therapeutic activity, [id number]- Neuromuscular re-education, 97535- Self Care, 02859- Manual therapy, [id number]-  Electrical stimulation (unattended), 97035- Ultrasound, 02966- Ionotophoresis  4mg /ml Dexamethasone , 20560 (1-2 muscles), 20561 (3+ muscles)- Dry Needling, Patient/Family education, Balance training, Stair training, Taping, Joint mobilization, Joint manipulation, Spinal manipulation, Spinal mobilization, Cryotherapy, and Moist heat  PLAN FOR NEXT SESSION:  continue DN as desired, ionto patch, stretching, strengthening,     Josette Rough, PT, DPT 08/15/23 11:44 AM

## 2023-08-16 IMAGING — US US THYROID
1 series · 13 of 25 positions shown · non-contrast
Comparison: 07/19/2019; 07/13/2017; 12/01/2016; 12/07/2015;
06/05/2015; 05/28/2014

CLINICAL DATA: Prior ultrasound follow-up. Follow up multinodular
goiter

EXAM:
THYROID ULTRASOUND
TECHNIQUE: Ultrasound examination of the thyroid gland and adjacent soft
tissues was performed.

[Series 1: us thyroid · 0.08mm/px · 13 of 64 slices shown]
[im 1/64]
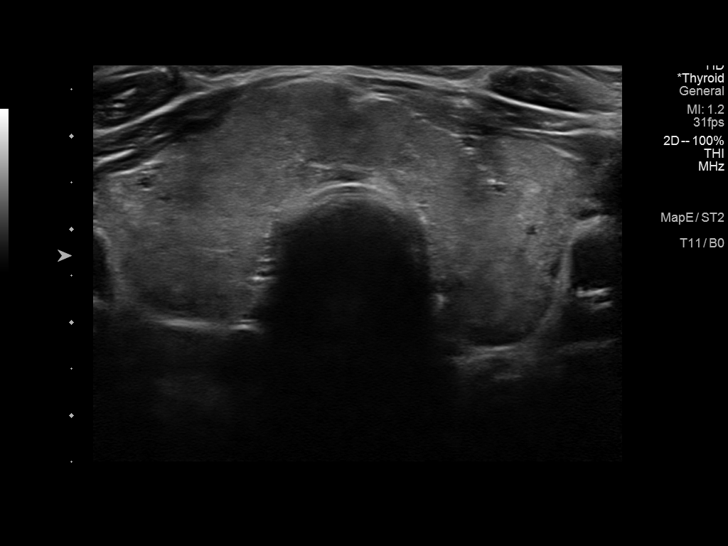
[im 6/64]
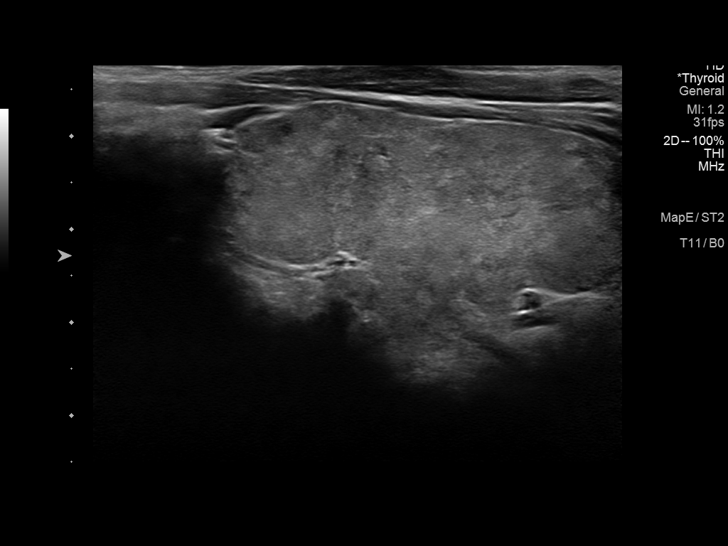
[im 11/64]
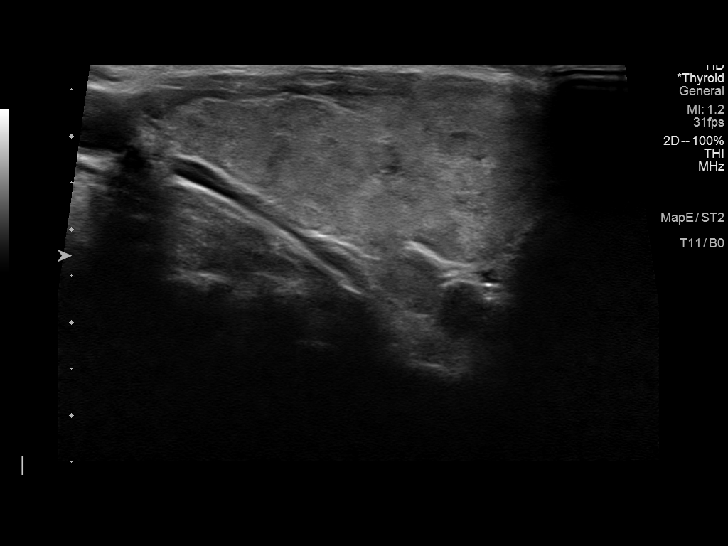
[im 16/64]
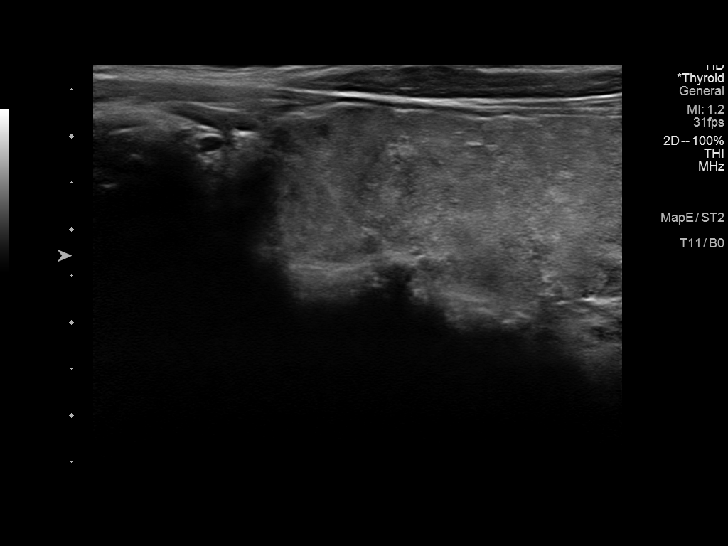
[im 22/64]
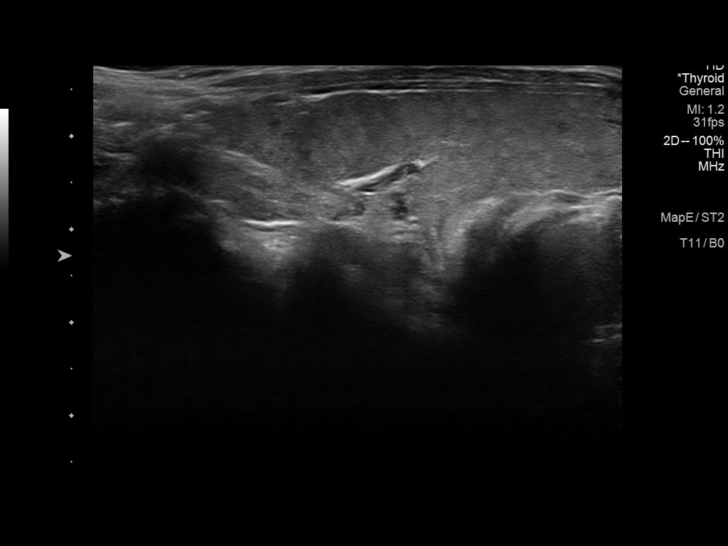
[im 27/64]
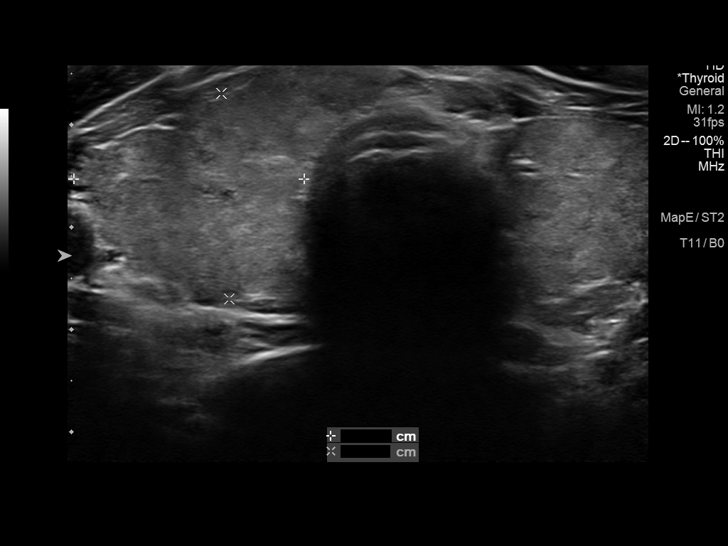
[im 32/64]
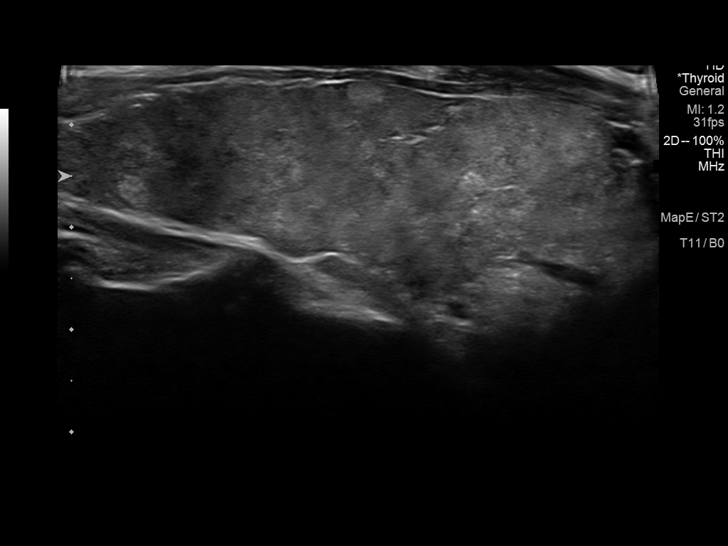
[im 37/64]
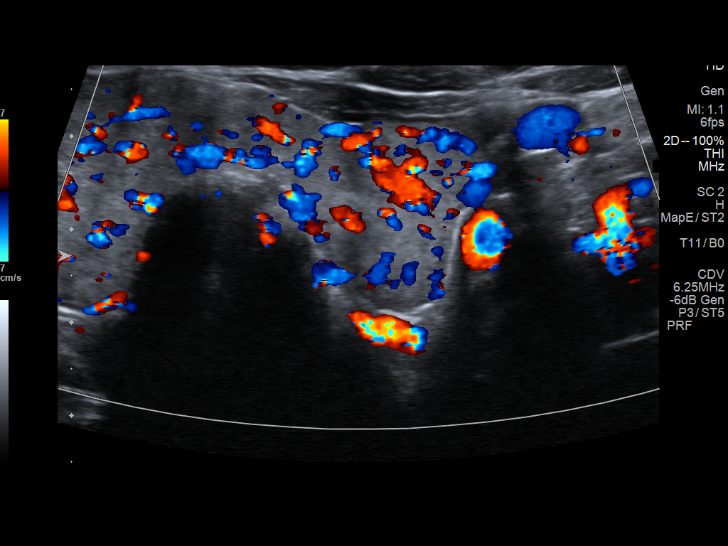
[im 43/64]
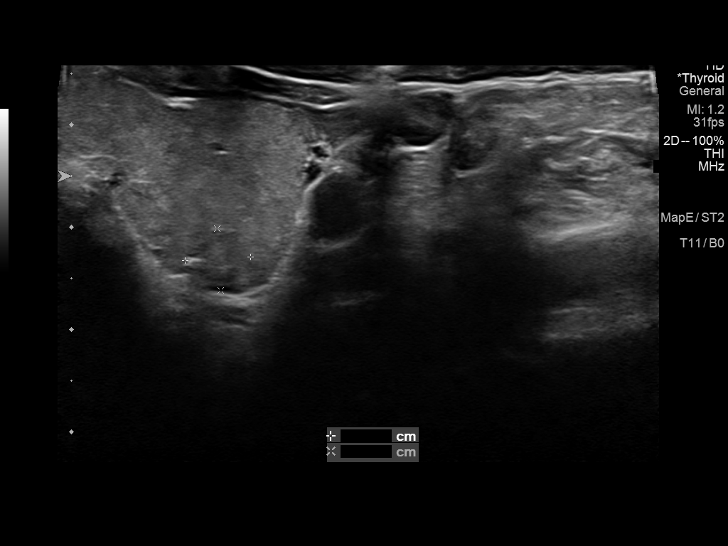
[im 48/64]
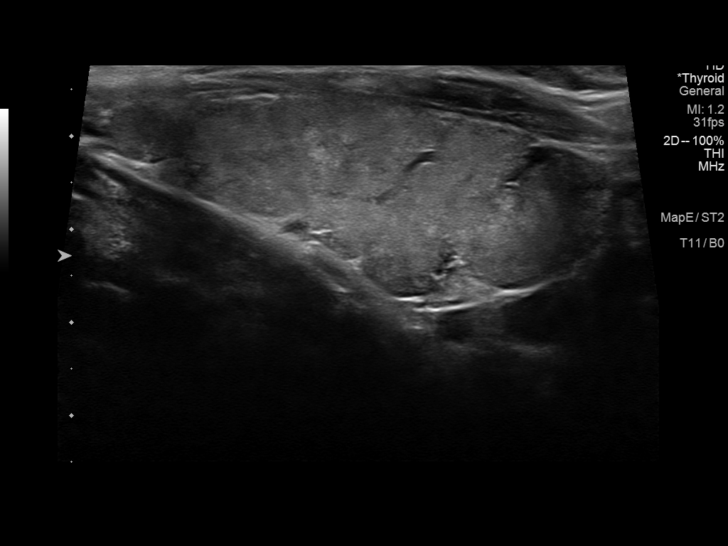
[im 53/64]
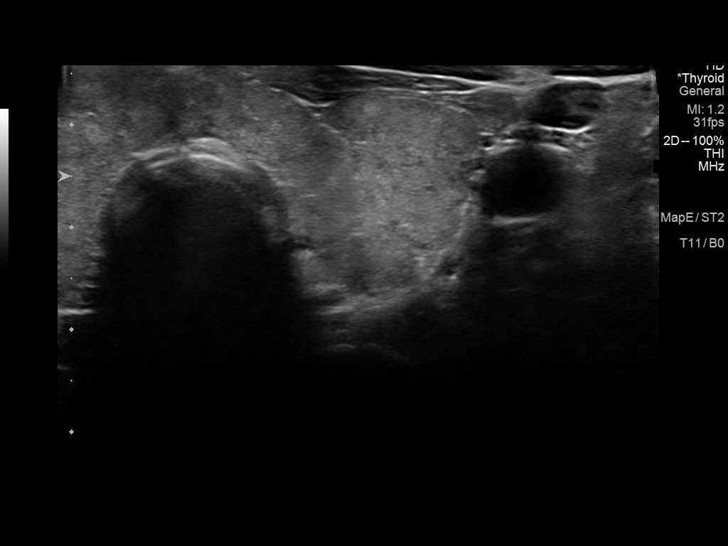
[im 58/64]
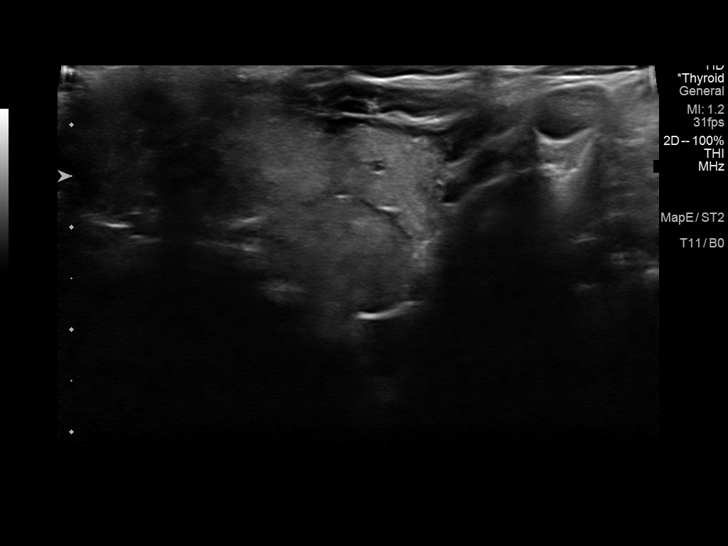
[im 64/64]
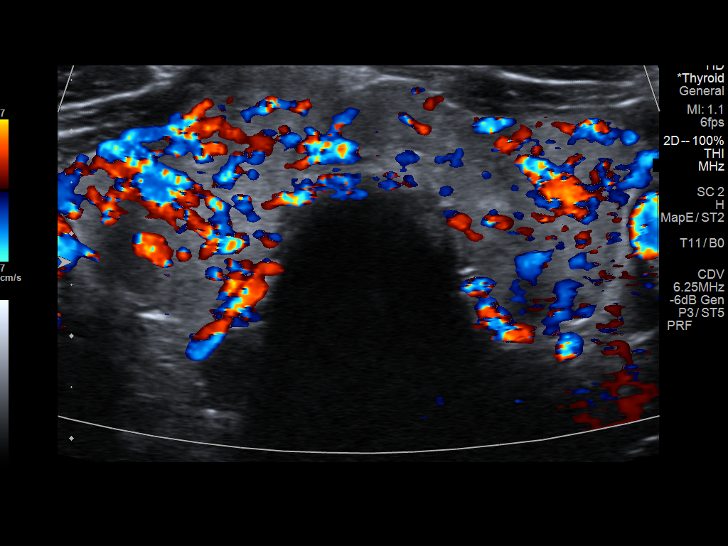

[13 of 25 positions shown; findings below may reference images not displayed]

FINDINGS: Parenchymal Echotexture: Moderately heterogenous - suspected diffuse
glandular hyperemia (images 14, 50, 51 and 65)

Isthmus: Enlarged measuring 0.9 cm in diameter, unchanged

Right lobe: Enlarged measuring 5.3 x 2.3 x 2.2 cm, previously 5.3 x
2.2 x 1.8 cm

Left lobe: Enlarged measuring 5.0 x 2.1 x 2.0 cm, previously 6.2 x
1.7 x 1.6 cm

_________________________________________________________

Estimated total number of nodules >/= 1 cm: 2

Number of spongiform nodules >/=  2 cm not described below (TR1): 0

Number of mixed cystic and solid nodules >/= 1.5 cm not described
below (TR2): 0

_________________________________________________________

The approximately 0.5 cm hypoechoic nodule adjacent to the
posterior, inferior aspect of the right lobe of the thyroid is
unchanged since the 6775 examination and again favored to represent
either a parathyroid adenoma versus a nonenlarged and stable
cervical lymph node.

_________________________________________________________

Questioned 0.8 cm isoechoic exophytic nodule arising from the mid,
posterior aspect of the right lobe of the thyroid (labeled 1), is
unchanged since at least 6775 examination. Imaging stability for
greater than 5 years is indicative of benign etiology.

_________________________________________________________

Questioned 1.1 x 0.6 x 0.6 cm isoechoic ill-defined nodule within
the posterior aspect of the left lobe of the thyroid (labeled 2), is
favored to represent a pseudonodule as it lacks defined borders on
both provided transverse and sagittal images.

Questioned 1.6 x 1.3 x 1.1 cm isoechoic ill-defined nodule within
the inferior pole of the left lobe of the thyroid (labeled 3), is
favored to represent a pseudonodule as it lacks defined borders on
both provided transverse and sagittal images.
IMPRESSION: 1. Similar-appearing enlarged, moderately heterogeneous appearing
and potentially hyperemic thyroid without definitive worrisome
nodule or mass. Findings are nonspecific though could be seen in the
setting of a thyroiditis.
2. None of the discretely measured thyroid nodules/pseudo nodules
meet imaging criteria to recommend percutaneous sampling working in
dedicated follow up
3. Redemonstrated 0.5 cm hypoechoic nodule adjacent to the
posteroinferior aspect of the right lobe of the thyroid, similar to
the 6775 examination, and favored to represent either a parathyroid
adenoma versus a stable non-pathologically enlarged cervical lymph
node.

The above is in keeping with the ACR TI-RADS recommendations - [HOSPITAL] 6775;[DATE].

## 2023-08-21 ENCOUNTER — Ambulatory Visit: Payer: PRIVATE HEALTH INSURANCE

## 2023-08-23 ENCOUNTER — Ambulatory Visit: Payer: PRIVATE HEALTH INSURANCE | Admitting: Physical Therapy

## 2023-08-23 ENCOUNTER — Encounter: Payer: Self-pay | Admitting: Physical Therapy

## 2023-08-23 DIAGNOSIS — M6281 Muscle weakness (generalized): Secondary | ICD-10-CM

## 2023-08-23 DIAGNOSIS — M25551 Pain in right hip: Secondary | ICD-10-CM

## 2023-08-23 DIAGNOSIS — M7061 Trochanteric bursitis, right hip: Secondary | ICD-10-CM

## 2023-08-23 NOTE — Therapy (Signed)
 OUTPATIENT PHYSICAL THERAPY LOWER EXTREMITY TREATMENT    Patient Name: Melinda Benton MRN: [id number] DOB:29-Mar-1960, 63 y.o., female Today's Date: 08/23/2023  END OF SESSION:  PT End of Session - 08/23/23 0838     Visit Number 4    Date for PT Re-Evaluation 10/31/23    Authorization Type Generic Commercial    PT Start Time 0803    PT Stop Time 0842    PT Time Calculation (min) 39 min    Activity Tolerance Patient tolerated treatment well    Behavior During Therapy [facility] for tasks assessed/performed             Past Medical History:  Diagnosis Date   Arthritis    Clotting disorder (HCC)    Cough 05/19/2014   H/O blood clots    History of chicken pox    Migraines    Mitral valve prolapse    Thyroid  disease    Past Surgical History:  Procedure Laterality Date   bladder sur     CESAREAN SECTION     2   CHOLECYSTECTOMY N/A 07/07/2019   Procedure: LAPAROSCOPIC CHOLECYSTECTOMY WITH INTRAOPERATIVE CHOLANGIOGRAM;  Surgeon: Curvin Deward MOULD, MD;  Location: [facility];  Service: General;  Laterality: N/A;   ELBOW SURGERY  2009   knee repla Left 07/2016   partial knee replacement    ROTATOR CUFF REPAIR  2011   TONSILLECTOMY  ?1968   Patient Active Problem List   Diagnosis Date Noted   Atypical atrial flutter (HCC) 04/27/2022   Bilateral hip pain 04/27/2022   Age-related osteoporosis without current pathological fracture 05/07/2021   Protein S deficiency (HCC) 11/24/2020   Family history of diabetes mellitus 04/21/2020   Hyperlipidemia 04/21/2020   Nontoxic single thyroid  nodule 04/21/2020   Closed nondisplaced fracture of proximal phalanx of lesser toe of right foot 02/18/2020   Closed nondisplaced fracture of proximal phalanx of lesser toe of left foot 12/02/2019   S/P laparoscopic cholecystectomy 07/11/2019   Family history of malignant neoplasm of colon 11/05/2018   Hypothyroidism 11/05/2018   Chronic left shoulder pain 03/06/2018   GAD (generalized anxiety disorder)  03/20/2017   S/P left unicompartmental knee replacement 07/19/2016   Chronic pain of right knee 06/07/2016   Spider varicose veins 09/01/2015   Palpitations 10/29/2013   Arthritis 10/09/2013   Anticoagulant long-term use 08/04/2010   History of pulmonary embolism 02/15/2010   Hypercoagulable state (HCC) 02/15/2010    PCP: Roselie Mood  REFERRING PROVIDER: [name]  REFERRING DIAG: B troch bursitis  THERAPY DIAG:  Bilateral hip pain  Trochanteric bursitis of both hips  Muscle weakness (generalized)  Rationale for Evaluation and Treatment: Rehabilitation  ONSET DATE: years, it is chronic   SUBJECTIVE:   SUBJECTIVE STATEMENT:  Nothing new since last time, leaving Wednesday about a week from today   EVAL: I have bursitis and been having injections. Last one was is May and lasted about 3 weeks. I have been doing home exercises since January and lost weight. But I realized the exercises cause pain. I am going hiking in [place] at the end of this month and am kind of nervous about it.   PERTINENT HISTORY: See above   PAIN:  Are you having pain? Yes: NPRS scale: its a lot better no number given/10 Pain location: bilateral hips  Pain description: achy, constant  but not as intense   Aggravating factors: stretches, walking long distances Relieving factors: Voltaren , DN   PRECAUTIONS: None  RED FLAGS: None  WEIGHT BEARING RESTRICTIONS: No  FALLS:  Has patient fallen in last 6 months? No  LIVING ENVIRONMENT: Lives with: lives with their spouse Lives in: House/apartment Stairs: Yes: External: 3 steps; none (has steps inside but master is on the main floor so doesn't have to manage) Has following equipment at home: Vannie - 2 wheeled  OCCUPATION: self employed  PLOF: Independent, Independent with basic ADLs, Independent with gait, and Independent with transfers  PATIENT GOALS: get rid of pain   NEXT MD VISIT:   OBJECTIVE:  Note: Objective  measures were completed at Evaluation unless otherwise noted.  DIAGNOSTIC FINDINGS: hip x-rays were clear   COGNITION: Overall cognitive status: Within functional limits for tasks assessed     SENSATION: WFL   POSTURE: No Significant postural limitations  PALPATION: TTP bilateral glutes, piriformis, and along lateral hips into ITB  LOWER EXTREMITY ROM: pain with L side hip flexion, some pain in L knee, all other WFL   LOWER EXTREMITY MMT:  MMT Right eval Left eval  Hip flexion 5 3-  Hip extension    Hip abduction    Hip adduction    Hip internal rotation    Hip external rotation    Knee flexion 5 5  Knee extension 5 3  Ankle dorsiflexion    Ankle plantarflexion    Ankle inversion    Ankle eversion     (Blank rows = not tested)  FUNCTIONAL TESTS:  5 times sit to stand: 15.50s                                                                                                                                 TREATMENT DATE:    08/23/23  Trigger Point Dry Needling  Subsequent Treatment: Instructions provided previously at initial dry needling treatment.   Patient Verbal Consent Given: Yes Education Handout Provided: Previously Provided Muscles Treated: glutes Treatment Response/Outcome: LTR   Dry needling as documented by Ozell Mainland PT   Nustep L6x6 minutes BLEs only Hip hike + swing x12 B Sidestepping red TB at thighs x2 laps, tried at ankles x1 but this increased pain Walking bridges x12  Sidelying hip hike + forward/backward circles 2x10 B    Education on percussion gun/usage- lower intensity/10 minutes max on a mm, higher intensity 2-3 minutes max on a mm. Printed off some recommended percussion guns. Goal would be to see if she can manage mm trigger points and spasms long term with percussion gun rather than relying on DN for soft tissue trigger point management        08/15/23  Trigger Point Dry Needling  Subsequent Treatment:  Instructions provided previously at initial dry needling treatment.   Patient Verbal Consent Given: Yes Education Handout Provided: Previously Provided Muscles Treated: glutes Treatment Response/Outcome: LTR   Dry needling as documented by Ozell Mainland PT  Walking bridges x10 Sidelying hip ABD red TB x10 B Squats to top of mat red  TB around knees x10 Hip hikes x12 B Hip hikes + swing x10 B Wall sits 10x10 seconds knees aligned over toes   Nustep L6x6 min BLEs only seat 11          PATIENT EDUCATION:  Education details: POC, DN, avoiding activities that hurt Person educated: Patient Education method: Explanation Education comprehension: verbalized understanding  HOME EXERCISE PROGRAM:  Access Code: [email or web address] URL: [email or web address] Date: 08/23/2023 Prepared by: Josette Rough  Exercises - Supine Figure 4 Piriformis Stretch  - 2 x daily - 7 x weekly - 1 sets - 3 reps - 30 seconds  hold - Figure 4 Bridge  - 1 x daily - 7 x weekly - 2 sets - 10 reps - Clamshell with Resistance  - 1 x daily - 7 x weekly - 2 sets - 10 reps - Standing Hip Hiking  - 1 x daily - 7 x weekly - 2 sets - 10 reps - Bridge Walk Out  - 1 x daily - 7 x weekly - 2 sets - 10 reps - Side Stepping with Resistance at Thighs  - 1 x daily - 7 x weekly - 2 sets - 5 reps    ASSESSMENT:  CLINICAL IMPRESSION:  Arrives today doing OK, reports more pain and trigger points after not having had DN for awhile. Continued with DN interventions but encouraged percussion gun for long term management of soft tissue issues especially while travelling. Updated HEP as appropriate. She leaves for her [place] trip next week.    EVAL: Patient is a 63 y.o. female who was seen today for physical therapy evaluation and treatment for bilateral hip pain. She reports bursitis on both sides and pain radiating into her thighs. She also has a sharp pain in the L groin area. Pt presents with weakness in the L hip  flexors and quad. She is pretty active but reports some stretches and exercises can increase her pain. She is going to the [place] for hiking at the end of August and is nervous that she may not be able to do it due to her pain. Patient may be experiencing some gluteal tendinopathy and has tightness in the glute and piriformis. She is TTP on both sides in the glutes and along ITB. Patient will benefit from PT to address her weakness and pain to allow her to be able to continue to be active and hike without difficulty.   OBJECTIVE IMPAIRMENTS: Abnormal gait, decreased strength, and pain.   ACTIVITY LIMITATIONS: locomotion level  PARTICIPATION LIMITATIONS: community activity  PERSONAL FACTORS: Time since onset of injury/illness/exacerbation are also affecting patient's functional outcome.   REHAB POTENTIAL: Good  CLINICAL DECISION MAKING: Stable/uncomplicated  EVALUATION COMPLEXITY: Low  GOALS: Goals reviewed with patient? Yes  SHORT TERM GOALS: Target date: 09/19/23  Patient will be independent with initial HEP. Baseline:  Goal status: INITIAL   LONG TERM GOALS: Target date: 10/31/23  Patient will be independent with advanced/ongoing HEP to improve outcomes and carryover.  Baseline:  Goal status: INITIAL  2.  Patient will report at least 75% improvement in bilateral hip pain to improve QOL. (<3/10) Baseline: 7-8/10 Goal status: INITIAL  3.  Patient will demonstrate improved functional LLE strength as demonstrated by 5/5. Baseline: hip flexor and knee ext 3-/5 Goal status: INITIAL  4.  Patient will be able to complete exercises classes and do stretches without pain  Baseline:  Goal status: INITIAL   PLAN:  PT FREQUENCY: 1-2x/week  PT DURATION: 12 weeks  PLANNED  INTERVENTIONS: 97110-Therapeutic exercises, 97530- Therapeutic activity, [id number]- Neuromuscular re-education, [phone]- Self Care, 02859- Manual therapy, G0283- Electrical stimulation (unattended), [phone]- Ultrasound,  [phone]- Ionotophoresis 4mg /ml Dexamethasone , 20560 (1-2 muscles), 20561 (3+ muscles)- Dry Needling, Patient/Family education, Balance training, Stair training, Taping, Joint mobilization, Joint manipulation, Spinal manipulation, Spinal mobilization, Cryotherapy, and Moist heat  PLAN FOR NEXT SESSION:  continue DN as desired, ionto patch, stretching, strengthening,  try to wean off needling to self-management strategies for trigger points as able. Would like to try percussion gun next time    Josette Rough, PT, DPT 08/23/23 8:42 AM

## 2023-08-24 ENCOUNTER — Encounter: Payer: Self-pay | Admitting: Physical Therapy

## 2023-08-24 ENCOUNTER — Ambulatory Visit: Payer: PRIVATE HEALTH INSURANCE | Admitting: Physical Therapy

## 2023-08-24 DIAGNOSIS — M7061 Trochanteric bursitis, right hip: Secondary | ICD-10-CM

## 2023-08-24 DIAGNOSIS — M25551 Pain in right hip: Secondary | ICD-10-CM

## 2023-08-24 DIAGNOSIS — M6281 Muscle weakness (generalized): Secondary | ICD-10-CM

## 2023-08-24 NOTE — Therapy (Signed)
 OUTPATIENT PHYSICAL THERAPY LOWER EXTREMITY TREATMENT    Patient Name: Melinda EISENMENGER MRN: [id number] DOB:01-23-1960, 63 y.o., female Today's Date: 08/24/2023  END OF SESSION:  PT End of Session - 08/24/23 1439     Visit Number 5    Date for PT Re-Evaluation 10/31/23    Authorization Type Generic Commercial    PT Start Time 1432    PT Stop Time 1514    PT Time Calculation (min) 42 min    Activity Tolerance Patient tolerated treatment well    Behavior During Therapy [facility] for tasks assessed/performed              Past Medical History:  Diagnosis Date   Arthritis    Clotting disorder (HCC)    Cough 05/19/2014   H/O blood clots    History of chicken pox    Migraines    Mitral valve prolapse    Thyroid  disease    Past Surgical History:  Procedure Laterality Date   bladder sur     CESAREAN SECTION     2   CHOLECYSTECTOMY N/A 07/07/2019   Procedure: LAPAROSCOPIC CHOLECYSTECTOMY WITH INTRAOPERATIVE CHOLANGIOGRAM;  Surgeon: Curvin Deward MOULD, MD;  Location: [facility];  Service: General;  Laterality: N/A;   ELBOW SURGERY  2009   knee repla Left 07/2016   partial knee replacement    ROTATOR CUFF REPAIR  2011   TONSILLECTOMY  ?1968   Patient Active Problem List   Diagnosis Date Noted   Atypical atrial flutter (HCC) 04/27/2022   Bilateral hip pain 04/27/2022   Age-related osteoporosis without current pathological fracture 05/07/2021   Protein S deficiency (HCC) 11/24/2020   Family history of diabetes mellitus 04/21/2020   Hyperlipidemia 04/21/2020   Nontoxic single thyroid  nodule 04/21/2020   Closed nondisplaced fracture of proximal phalanx of lesser toe of right foot 02/18/2020   Closed nondisplaced fracture of proximal phalanx of lesser toe of left foot 12/02/2019   S/P laparoscopic cholecystectomy 07/11/2019   Family history of malignant neoplasm of colon 11/05/2018   Hypothyroidism 11/05/2018   Chronic left shoulder pain 03/06/2018   GAD (generalized anxiety  disorder) 03/20/2017   S/P left unicompartmental knee replacement 07/19/2016   Chronic pain of right knee 06/07/2016   Spider varicose veins 09/01/2015   Palpitations 10/29/2013   Arthritis 10/09/2013   Anticoagulant long-term use 08/04/2010   History of pulmonary embolism 02/15/2010   Hypercoagulable state (HCC) 02/15/2010    PCP: Roselie Mood  REFERRING PROVIDER: [name]  REFERRING DIAG: B troch bursitis  THERAPY DIAG:  Bilateral hip pain  Trochanteric bursitis of both hips  Muscle weakness (generalized)  Rationale for Evaluation and Treatment: Rehabilitation  ONSET DATE: years, it is chronic   SUBJECTIVE:   SUBJECTIVE STATEMENT:  Doing OK today, I felt the best I've felt after yesterday although I was really sore until I woke up this morning- had to use ice and voltaren .   EVAL: I have bursitis and been having injections. Last one was is May and lasted about 3 weeks. I have been doing home exercises since January and lost weight. But I realized the exercises cause pain. I am going hiking in [place] at the end of this month and am kind of nervous about it.   PERTINENT HISTORY: See above   PAIN:  Are you having pain? Yes: NPRS scale: its a lot better no number given/10 Pain location: bilateral hips  Pain description: achy, constant  but not as intense   Aggravating factors: stretches,  walking long distances Relieving factors: Voltaren , DN   PRECAUTIONS: None  RED FLAGS: None   WEIGHT BEARING RESTRICTIONS: No  FALLS:  Has patient fallen in last 6 months? No  LIVING ENVIRONMENT: Lives with: lives with their spouse Lives in: House/apartment Stairs: Yes: External: 3 steps; none (has steps inside but master is on the main floor so doesn't have to manage) Has following equipment at home: Vannie - 2 wheeled  OCCUPATION: self employed  PLOF: Independent, Independent with basic ADLs, Independent with gait, and Independent with transfers  PATIENT  GOALS: get rid of pain   NEXT MD VISIT:   OBJECTIVE:  Note: Objective measures were completed at Evaluation unless otherwise noted.  DIAGNOSTIC FINDINGS: hip x-rays were clear   COGNITION: Overall cognitive status: Within functional limits for tasks assessed     SENSATION: WFL   POSTURE: No Significant postural limitations  PALPATION: TTP bilateral glutes, piriformis, and along lateral hips into ITB  LOWER EXTREMITY ROM: pain with L side hip flexion, some pain in L knee, all other WFL   LOWER EXTREMITY MMT:  MMT Right eval Left eval  Hip flexion 5 3-  Hip extension    Hip abduction    Hip adduction    Hip internal rotation    Hip external rotation    Knee flexion 5 5  Knee extension 5 3  Ankle dorsiflexion    Ankle plantarflexion    Ankle inversion    Ankle eversion     (Blank rows = not tested)  FUNCTIONAL TESTS:  5 times sit to stand: 15.50s                                                                                                                                 TREATMENT DATE:    08/24/23  Nustep L5x8 minutes BLEs only for w/u  Hip extensions 10# on machine cues for form Forward step ups 6 inch step cues for good alignment Side steps on TM about 60 seconds each side speed 1.0  Education about gym machines- hip extension and ABD cables, avoid donkey kick machine without a good spotter, education about interventions for straight up bursitis vs tendonosis/tendonitis and how aggressive stretching can aggravate sx   Percussion gun glutes, lateral thighs and hamstrings     08/23/23  Trigger Point Dry Needling  Subsequent Treatment: Instructions provided previously at initial dry needling treatment.   Patient Verbal Consent Given: Yes Education Handout Provided: Previously Provided Muscles Treated: glutes Treatment Response/Outcome: LTR   Dry needling as documented by Ozell Mainland PT   Nustep L6x6 minutes BLEs only Hip hike + swing  x12 B Sidestepping red TB at thighs x2 laps, tried at ankles x1 but this increased pain Walking bridges x12  Sidelying hip hike + forward/backward circles 2x10 B    Education on percussion gun/usage- lower intensity/10 minutes max on a mm, higher intensity 2-3 minutes max on a mm. Printed off  some recommended percussion guns. Goal would be to see if she can manage mm trigger points and spasms long term with percussion gun rather than relying on DN for soft tissue trigger point management             PATIENT EDUCATION:  Education details: POC, DN, avoiding activities that hurt Person educated: Patient Education method: Explanation Education comprehension: verbalized understanding  HOME EXERCISE PROGRAM:  Access Code: [email or web address] URL: [email or web address] Date: 08/23/2023 Prepared by: Josette Rough  Exercises - Supine Figure 4 Piriformis Stretch  - 2 x daily - 7 x weekly - 1 sets - 3 reps - 30 seconds  hold - Figure 4 Bridge  - 1 x daily - 7 x weekly - 2 sets - 10 reps - Clamshell with Resistance  - 1 x daily - 7 x weekly - 2 sets - 10 reps - Standing Hip Hiking  - 1 x daily - 7 x weekly - 2 sets - 10 reps - Bridge Walk Out  - 1 x daily - 7 x weekly - 2 sets - 10 reps - Side Stepping with Resistance at Thighs  - 1 x daily - 7 x weekly - 2 sets - 5 reps    ASSESSMENT:  CLINICAL IMPRESSION:  Arrives today doing OK, felt really good this morning after session yesterday. We focused more on functional exercises today and tried percussion gun as well (dry needling certified therapist not available/in clinic today). Will plan on resuming dry needling next visit before she leaves for her trip if needed.    EVAL: Patient is a 63 y.o. female who was seen today for physical therapy evaluation and treatment for bilateral hip pain. She reports bursitis on both sides and pain radiating into her thighs. She also has a sharp pain in the L groin area. Pt presents with weakness  in the L hip flexors and quad. She is pretty active but reports some stretches and exercises can increase her pain. She is going to the [place] for [place] at the end of August and is nervous that she may not be able to do it due to her pain. Patient may be experiencing some gluteal tendinopathy and has tightness in the glute and piriformis. She is TTP on both sides in the glutes and along ITB. Patient will benefit from PT to address her weakness and pain to allow her to be able to continue to be active and hike without difficulty.   OBJECTIVE IMPAIRMENTS: Abnormal gait, decreased strength, and pain.   ACTIVITY LIMITATIONS: locomotion level  PARTICIPATION LIMITATIONS: community activity  PERSONAL FACTORS: Time since onset of injury/illness/exacerbation are also affecting patient's functional outcome.   REHAB POTENTIAL: Good  CLINICAL DECISION MAKING: Stable/uncomplicated  EVALUATION COMPLEXITY: Low  GOALS: Goals reviewed with patient? Yes  SHORT TERM GOALS: Target date: 09/19/23  Patient will be independent with initial HEP. Baseline:  Goal status: INITIAL   LONG TERM GOALS: Target date: 10/31/23  Patient will be independent with advanced/ongoing HEP to improve outcomes and carryover.  Baseline:  Goal status: INITIAL  2.  Patient will report at least 75% improvement in bilateral hip pain to improve QOL. (<3/10) Baseline: 7-8/10 Goal status: INITIAL  3.  Patient will demonstrate improved functional LLE strength as demonstrated by 5/5. Baseline: hip flexor and knee ext 3-/5 Goal status: INITIAL  4.  Patient will be able to complete exercises classes and do stretches without pain  Baseline:  Goal status: INITIAL   PLAN:  PT FREQUENCY: 1-2x/week  PT DURATION: 12 weeks  PLANNED INTERVENTIONS: 97110-Therapeutic exercises, 97530- Therapeutic activity, 97112- Neuromuscular re-education, [phone]- Self Care, 02859- Manual therapy, G0283- Electrical stimulation (unattended), 97035-  Ultrasound, [id number]- Ionotophoresis 4mg /ml Dexamethasone , 79439 (1-2 muscles), 20561 (3+ muscles)- Dry Needling, Patient/Family education, Balance training, Stair training, Taping, Joint mobilization, Joint manipulation, Spinal manipulation, Spinal mobilization, Cryotherapy, and Moist heat  PLAN FOR NEXT SESSION:  update goals and objectives before her trip, dry needling if desired, HEP updates before she leaves    Josette Rough, PT, DPT 08/24/23 3:15 PM

## 2023-08-29 ENCOUNTER — Ambulatory Visit: Payer: PRIVATE HEALTH INSURANCE

## 2023-08-30 ENCOUNTER — Ambulatory Visit (INDEPENDENT_AMBULATORY_CARE_PROVIDER_SITE_OTHER): Payer: PRIVATE HEALTH INSURANCE

## 2023-08-30 ENCOUNTER — Telehealth: Payer: Self-pay

## 2023-08-30 ENCOUNTER — Other Ambulatory Visit (HOSPITAL_COMMUNITY): Payer: Self-pay

## 2023-08-30 DIAGNOSIS — Z7901 Long term (current) use of anticoagulants: Secondary | ICD-10-CM

## 2023-08-30 LAB — POCT INR: INR: 1.9 — AB (ref 2.0–3.0)

## 2023-08-30 NOTE — Progress Notes (Signed)
 Pt tests INR at home and submits results via [employer] portal.  INR is 1.9 today. Increase dose today to take 1 tablet and then continue 6mg  daily except take 3mg  on Mondays, Wednesdays and Saturdays. Recheck in 3 weeks, on 09/19/23.  LVM with dosing and recheck date.

## 2023-08-30 NOTE — Telephone Encounter (Signed)
 Pt tests INR at home. Pt was due yesterday to test. Contacted pt and she will test this morning.

## 2023-08-30 NOTE — Progress Notes (Signed)
 I have reviewed and agree with note, evaluation, plan.   Garnette Lukes, MD

## 2023-08-30 NOTE — Patient Instructions (Addendum)
 Pre visit review using our clinic review tool, if applicable. No additional management support is needed unless otherwise documented below in the visit note.  Increase dose today to take 1 tablet and then continue 6mg  daily except take 3mg  on Mondays, Wednesdays and Saturdays. Recheck in 3 weeks, on 09/19/23.

## 2023-08-31 NOTE — Telephone Encounter (Signed)
 Appeals faxed 08/31/23

## 2023-09-11 ENCOUNTER — Ambulatory Visit: Payer: PRIVATE HEALTH INSURANCE | Admitting: Physical Therapy

## 2023-09-12 ENCOUNTER — Encounter: Payer: Self-pay | Admitting: Nurse Practitioner

## 2023-09-13 ENCOUNTER — Ambulatory Visit: Payer: PRIVATE HEALTH INSURANCE | Admitting: Physical Therapy

## 2023-09-20 LAB — POCT INR: INR: 2.7 (ref 2.0–3.0)

## 2023-09-20 NOTE — Telephone Encounter (Signed)
 Pt is due to test INR. Advised to test. Pt said she will do it now and input the result in the website.

## 2023-09-21 ENCOUNTER — Ambulatory Visit (INDEPENDENT_AMBULATORY_CARE_PROVIDER_SITE_OTHER): Payer: PRIVATE HEALTH INSURANCE

## 2023-09-21 ENCOUNTER — Telehealth: Payer: Self-pay

## 2023-09-21 DIAGNOSIS — Z7901 Long term (current) use of anticoagulants: Secondary | ICD-10-CM

## 2023-09-21 NOTE — Progress Notes (Signed)
 Pt tests INR at home and submits results via [employer] portal after hours last night.  INR result received this morning and is 2.7. Continue 6mg  daily except take 3mg  on Mondays, Wednesdays and Saturdays. Recheck in 4 weeks, on 10/17/23.  Advised pt of dosing and recheck date. Pt reports she has had a lot of chronic hip pain which is worsening and effecting her quality of life. She has researched causes of this pain and found that low estrogen can contribute to this. She reports estrogen levels have been checked by GYN and it is severely low. She discussed with the GYN about adding an estrogen supplement. She reports they are concerned because she is on warfarin and recommended she discuss with PCP. Advised pt if she is going to start an estrogen supplement we will need to monitor INR closely, but warfarin dosing can be adjusted if she does start this supplement. Advised to talk to her PCP concerning the supplement and inquire is she will prescribe it and if not maybe refer her to hematology to assess.  Pt reports she will send PCP a msg tomorrow and inquire about it. Advised this nurse will also send a msg to her PCP. Advised if anything else is needed to feel free to contact the coumadin  clinic. Advised if she is prescribed estrogen to contact the coumadin  clinic. Pt verbalized understanding and was appreciative of the help. Sent msg to PCP concerning estrogen supplement.

## 2023-09-21 NOTE — Telephone Encounter (Signed)
 Pt was contacted today due to receiving her INR result. She was advised of dosing and recheck date.  During the call pt reports she has had a lot of chronic hip pain which is worsening and effecting her quality of life. She has researched causes of this pain and found that low estrogen can contribute to this. She reports estrogen levels have been checked by GYN and it is severely low. She discussed with the GYN about adding an estrogen supplement. She reports they are concerned because she is on warfarin and recommended she discuss with PCP. Advised pt if she is going to start an estrogen supplement we will need to monitor INR closely, but warfarin dosing can be adjusted if she does start this supplement. Advised to talk to her PCP concerning the supplement and inquire is she will prescribe it and if not maybe refer her to hematology to assess.  Pt reports she will send PCP a msg tomorrow and inquire about it. Advised this nurse will also send a msg to her PCP. Advised if anything else is needed to feel free to contact the coumadin  clinic. Advised if she is prescribed estrogen to contact the coumadin  clinic. Pt verbalized understanding and was appreciative of the help.

## 2023-09-21 NOTE — Patient Instructions (Addendum)
 Pre visit review using our clinic review tool, if applicable. No additional management support is needed unless otherwise documented below in the visit note.  Continue 6mg  daily except take 3mg  on Mondays, Wednesdays and Saturdays. Recheck in 4 weeks, on 10/17/23.

## 2023-09-21 NOTE — Telephone Encounter (Signed)
 Called and left a voice message asking to give me a call back at the office at [phone]

## 2023-09-21 NOTE — Telephone Encounter (Signed)
 Nche, Roselie Rockford, NP to Me  Lenon Roughen, CMA (Selected Message)   09/21/23 12:37 PM Thank you for letting me know. She need an office visit.   CN   LVM for pt and advised to contact PCP office to make an apt.

## 2023-09-25 ENCOUNTER — Ambulatory Visit: Payer: PRIVATE HEALTH INSURANCE | Admitting: Nurse Practitioner

## 2023-10-03 ENCOUNTER — Other Ambulatory Visit (HOSPITAL_COMMUNITY): Payer: Self-pay

## 2023-10-03 NOTE — Telephone Encounter (Signed)
 This was the response to the appeal.

## 2023-10-03 NOTE — Telephone Encounter (Signed)
 Please see denial in chart media.

## 2023-10-03 NOTE — Telephone Encounter (Signed)
 SABRA

## 2023-10-04 ENCOUNTER — Telehealth: Payer: Self-pay | Admitting: Nurse Practitioner

## 2023-10-04 NOTE — Telephone Encounter (Signed)
 04/10/2023 same day cancellation 09/25/2023 no show  Final warning sent via mail and mychart.

## 2023-10-11 ENCOUNTER — Ambulatory Visit: Payer: PRIVATE HEALTH INSURANCE | Admitting: Nurse Practitioner

## 2023-10-17 ENCOUNTER — Ambulatory Visit (INDEPENDENT_AMBULATORY_CARE_PROVIDER_SITE_OTHER): Payer: PRIVATE HEALTH INSURANCE

## 2023-10-17 DIAGNOSIS — Z7901 Long term (current) use of anticoagulants: Secondary | ICD-10-CM

## 2023-10-17 LAB — POCT INR: INR: 2.1 (ref 2.0–3.0)

## 2023-10-17 NOTE — Progress Notes (Signed)
 Pt tests INR at home and submits results via [employer] portal after hours last night.  INR result received this morning and is 2.1 Continue 6mg  daily except take 3mg  on Mondays, Wednesdays and Saturdays. Recheck in 4 weeks, on 11/14/23.  LVM with dosing and retest date.

## 2023-10-17 NOTE — Patient Instructions (Addendum)
 Pre visit review using our clinic review tool, if applicable. No additional management support is needed unless otherwise documented below in the visit note.  Continue 6mg  daily except take 3mg  on Mondays, Wednesdays and Saturdays. Recheck in 4 weeks, on 11/14/23.

## 2023-11-11 ENCOUNTER — Other Ambulatory Visit: Payer: Self-pay

## 2023-11-11 ENCOUNTER — Encounter (HOSPITAL_COMMUNITY): Payer: Self-pay

## 2023-11-11 ENCOUNTER — Emergency Department (HOSPITAL_COMMUNITY): Admit: 2023-11-11 | Discharge: 2023-11-11 | Disposition: A | Payer: PRIVATE HEALTH INSURANCE

## 2023-11-11 ENCOUNTER — Emergency Department (HOSPITAL_COMMUNITY)
Admission: EM | Admit: 2023-11-11 | Discharge: 2023-11-11 | Disposition: A | Discharge status: Home / Self Care | Payer: PRIVATE HEALTH INSURANCE | Attending: Emergency Medicine | Admitting: Emergency Medicine

## 2023-11-11 DIAGNOSIS — R52 Pain, unspecified: Secondary | ICD-10-CM | POA: Diagnosis not present

## 2023-11-11 DIAGNOSIS — Z79899 Other long term (current) drug therapy: Secondary | ICD-10-CM | POA: Diagnosis not present

## 2023-11-11 DIAGNOSIS — E039 Hypothyroidism, unspecified: Secondary | ICD-10-CM | POA: Diagnosis not present

## 2023-11-11 DIAGNOSIS — M25561 Pain in right knee: Secondary | ICD-10-CM | POA: Diagnosis present

## 2023-11-11 MED ORDER — CELECOXIB 200 MG PO CAPS
200.0000 mg | ORAL_CAPSULE | Freq: Two times a day (BID) | ORAL | 1 refills | Status: AC
Start: 2023-11-11 — End: ?

## 2023-11-11 NOTE — Discharge Instructions (Addendum)
 Would suggest follow-up with orthopedics should this fail to improve with current medications prescribed.

## 2023-11-11 NOTE — ED Triage Notes (Signed)
 PT arrives via POV. PT reports she began experiencing pain to her right calf and knee yesterday. She does take warfarin, states her inr was subtherapeutic. She is unsure if she missed any doses. PT denies any certain injury. States she was under a crawl space this past Thursday. Denies cp or sob at this time. Pt AxOx4.

## 2023-11-11 NOTE — ED Provider Notes (Signed)
 [place] EMERGENCY DEPARTMENT AT [facility] Provider Note   CSN: [id number] Arrival date & time: 11/11/23  1216     Patient presents with: Leg Pain   Melinda Benton is a 63 y.o. female who presents out of concern for 2-day history of right knee pain, specifically on the posterior aspect of the knee.  Denies have any specific falls or injuries however states that several days prior she was crawling in the crawlspace of the house, though denies having any injuries as a result of this.  Denies having any lower back pain.  States that the pain originates on the medial aspect of the popliteal fossa and radiates to the medial malleolus.  Previous medical history shows diagnosis of protein S deficiency, otherwise hypothyroidism, migraines, arthritis.  Does use ibandronate  for osteoporosis.    Leg Pain      Prior to Admission medications   Medication Sig Start Date End Date Taking? Authorizing Provider  celecoxib (CELEBREX) 200 MG capsule Take 1 capsule (200 mg total) by mouth 2 (two) times daily. 11/11/23  Yes Myriam Dorn BROCKS, PA  Alirocumab  (PRALUENT ) 150 MG/ML SOAJ Inject 1 mL (150 mg total) into the skin every 14 (fourteen) days. 06/08/23   Waddell Danelle ORN, MD  atorvastatin  (LIPITOR) 10 MG tablet Take 10 mg by mouth daily.    [provider]  buPROPion  (WELLBUTRIN  XL) 300 MG 24 hr tablet Take 1 tablet (300 mg total) by mouth daily. Need Office Visit for additional refills 11/12/17   Nche, Roselie Rockford, NP  ibandronate  (BONIVA ) 150 MG tablet Take 1 tablet (150 mg total) by mouth every 30 (thirty) days. Take in the morning with a full glass of water, on an empty stomach, and do not take anything else by mouth or lie down for the next 30 min. 05/07/21   Nche, Roselie Rockford, NP  levothyroxine  (SYNTHROID , LEVOTHROID) 88 MCG tablet Take 88 mcg by mouth daily before breakfast.    [provider]  metoprolol  succinate (TOPROL  XL) 25 MG 24 hr tablet Take 1 tablet  (25 mg total) by mouth daily as needed. 05/14/21   Lesia Ozell Barter, PA-C  pantoprazole  (PROTONIX ) 20 MG tablet Take 1 tablet (20 mg total) by mouth daily as needed for heartburn or indigestion. 05/07/21   Nche, Roselie Rockford, NP  triamterene-hydrochlorothiazide (DYAZIDE) 37.5-25 MG capsule Take 1 capsule by mouth daily as needed. 09/19/22   [provider]  warfarin (COUMADIN ) 6 MG tablet TAKE 6 MG BY MOUTH DAILY EXCEPT TAKE 3 MG ON MONDAY, WEDNESDAY AND SATURDAY OR AS DIRECTED BY ANTICOAGULATION CLINIC 06/09/23   Nche, Roselie Rockford, NP    Allergies: Patient has no known allergies.    Review of Systems  Musculoskeletal:  Positive for arthralgias.  All other systems reviewed and are negative.   Updated Vital Signs BP (!) 155/84 (BP Location: Left Arm)   Pulse 74   Temp 98.1 F (36.7 C) (Oral)   Resp 18   Ht 5' 8 (1.727 m)   Wt 65.8 kg   LMP 05/01/2012   SpO2 99%   BMI 22.05 kg/m   Physical Exam Vitals and nursing note reviewed.  Constitutional:      General: She is not in acute distress.    Appearance: Normal appearance.  HENT:     Head: Normocephalic and atraumatic.     Mouth/Throat:     Mouth: Mucous membranes are moist.     Pharynx: Oropharynx is clear.  Eyes:  Extraocular Movements: Extraocular movements intact.     Conjunctiva/sclera: Conjunctivae normal.     Pupils: Pupils are equal, round, and reactive to light.  Cardiovascular:     Rate and Rhythm: Normal rate and regular rhythm.     Pulses: Normal pulses.     Heart sounds: Normal heart sounds. No murmur heard.    No friction rub. No gallop.  Pulmonary:     Effort: Pulmonary effort is normal.     Breath sounds: Normal breath sounds.  Abdominal:     General: Abdomen is flat. Bowel sounds are normal.     Palpations: Abdomen is soft.  Musculoskeletal:        General: Normal range of motion.     Cervical back: Normal range of motion and neck supple.     Right knee: No swelling, deformity,  effusion or bony tenderness. Normal range of motion. ACL laxity and PCL laxity present.     Left knee: Normal.     Right lower leg: No edema.     Left lower leg: No edema.     Comments: There is ACL PCL laxity as the patient has had bilateral partial knee replacements, consistent with the surgery.  Skin:    General: Skin is warm and dry.     Capillary Refill: Capillary refill takes less than 2 seconds.  Neurological:     General: No focal deficit present.     Mental Status: She is alert. Mental status is at baseline.  Psychiatric:        Mood and Affect: Mood normal.     (all labs ordered are listed, but only abnormal results are displayed) Labs Reviewed - No data to display  EKG: None  Radiology: No results found.   Procedures   Medications Ordered in the ED - No data to display                                  Medical Decision Making  Medical Decision Making:   Melinda Benton is a 63 y.o. female who presented to the ED today with right-sided posterior knee pain detailed above.     Complete initial physical exam performed, notably the patient  was alert and oriented in no apparent distress.  There is no deformity to the right knee, there is instability that is consistent with her previous partial knee replacement however otherwise normal-appearing knee, no Baker's cyst appreciated..    Reviewed and confirmed nursing documentation for past medical history, family history, social history.    Initial Assessment:   With the patient's presentation of right sided posterior knee pain, with radicular pain down the lower extremity consider possible sciatica, however she does not have any back pain, further consider DVT secondary to patient's history of protein S deficiency, also consider muscular strain with radicular neuropathy.  Initial Plan:  Obtain ultrasound imaging of the right lower extremity to evaluate for DVT. Objective evaluation as below reviewed   Initial  Study Results:   Radiology:  All images reviewed independently. Agree with radiology report at this time.   No results found.    Reassessment and Plan:   Ultrasound imaging was negative for DVT.  With stable physical exam findings, and negative DVT exam, feel that this is likely secondary to muscular strain with radicular neuropathy.  Will have her take a course of celecoxib to manage acute inflammation, rest and elevate the affected extremity, and  follow-up with outpatient orthopedics as needed for continued pain.  Patient understands and agrees has no further concerns at this time.  As they are stable, and have no concerning findings on the exam will discharge with outpatient follow-up as previously noted.  Could further consider possible osteoarthritis, if no improvement with current therapy would suggest follow-up with orthopedics.       Final diagnoses:  Right knee pain, unspecified chronicity    ED Discharge Orders          Ordered    celecoxib (CELEBREX) 200 MG capsule  2 times daily        11/11/23 1435               Myriam Dorn BROCKS, [place] 11/11/23 1436    Geraldene Hamilton, MD 11/14/23 1317

## 2023-11-11 NOTE — Progress Notes (Signed)
 Right lower extremity venous duplex has been completed. Preliminary results can be found in CV Proc through chart review.  Results were given to Dorn Dec PA.  11/11/23 1:44 PM Cathlyn Collet RVT

## 2023-11-14 ENCOUNTER — Ambulatory Visit (INDEPENDENT_AMBULATORY_CARE_PROVIDER_SITE_OTHER): Payer: PRIVATE HEALTH INSURANCE

## 2023-11-14 DIAGNOSIS — Z7901 Long term (current) use of anticoagulants: Secondary | ICD-10-CM

## 2023-11-14 LAB — POCT INR: INR: 2.9 (ref 2.0–3.0)

## 2023-11-14 NOTE — Patient Instructions (Addendum)
 Pre visit review using our clinic review tool, if applicable. No additional management support is needed unless otherwise documented below in the visit note.  Continue 6mg  daily except take 3mg  on Mondays, Wednesdays and Saturdays. Recheck in 4 weeks, on 12/12/23.

## 2023-11-14 NOTE — Progress Notes (Signed)
 Pt tests INR at home and submits results via [employer] portal.  INR result received is 2.9 Pt was in ER on 11/8 due to knee pain. She was prescribed celecoxib. This has a major interaction with warfarin and will increase bleeding risk.  Continue 6mg  daily except take 3mg  on Mondays, Wednesdays and Saturdays. Recheck in 4 weeks, on 12/12/23.  Advised pt of dosing and next testing date. Advised of interaction with celecoxib. She reported she took it for 3 days and is feeling completely better now. Advised if she needs to take it again to contact the coumadin  clinic. She reports she also ate a big kale salad. This could have reduced the interaction of the medication. Pt verbalized understanding.

## 2023-12-12 LAB — POCT INR: INR: 2.9 (ref 2.0–3.0)

## 2023-12-13 ENCOUNTER — Ambulatory Visit (INDEPENDENT_AMBULATORY_CARE_PROVIDER_SITE_OTHER): Payer: PRIVATE HEALTH INSURANCE

## 2023-12-13 DIAGNOSIS — Z7901 Long term (current) use of anticoagulants: Secondary | ICD-10-CM | POA: Diagnosis not present

## 2023-12-13 NOTE — Patient Instructions (Addendum)
 Pre visit review using our clinic review tool, if applicable. No additional management support is needed unless otherwise documented below in the visit note.  Continue 6mg  daily except take 3mg  on Mondays, Wednesdays and Saturdays. Recheck in 4 weeks, on 01/09/23.

## 2023-12-13 NOTE — Progress Notes (Signed)
 I have reviewed and agree with note, evaluation, plan.   Garnette Lukes, MD

## 2023-12-13 NOTE — Progress Notes (Signed)
 Pt tests INR at home and submits results via [employer] portal.  INR result reported after hours yesterday and was 2.9 Continue 6mg  daily except take 3mg  on Mondays, Wednesdays and Saturdays. Recheck in 4 weeks, on 01/09/23.  LVM with dosing and recheck date.

## 2024-01-10 ENCOUNTER — Telehealth: Payer: Self-pay

## 2024-01-10 ENCOUNTER — Ambulatory Visit (INDEPENDENT_AMBULATORY_CARE_PROVIDER_SITE_OTHER): Payer: PRIVATE HEALTH INSURANCE

## 2024-01-10 DIAGNOSIS — Z7901 Long term (current) use of anticoagulants: Secondary | ICD-10-CM

## 2024-01-10 LAB — POCT INR: INR: 2.3 (ref 2.0–3.0)

## 2024-01-10 NOTE — Telephone Encounter (Signed)
 Pt tests INR at home. Pt was to test yesterday but did not receive a result. LVM for pt to test.

## 2024-01-10 NOTE — Progress Notes (Signed)
 Pt tests INR at home and submits results via [employer] portal.  INR result reported after hours yesterday and was 2.3 Continue 6mg  daily except take 3mg  on Mondays, Wednesdays and Saturdays. Recheck in 4 weeks, on 02/06/24.  LVM with dosing and recheck date.

## 2024-01-10 NOTE — Patient Instructions (Addendum)
 Pre visit review using our clinic review tool, if applicable. No additional management support is needed unless otherwise documented below in the visit note.  Continue 6mg  daily except take 3mg  on Mondays, Wednesdays and Saturdays. Recheck in 4 weeks, on 02/06/24.

## 2024-02-06 ENCOUNTER — Ambulatory Visit: Payer: Self-pay

## 2024-02-06 DIAGNOSIS — Z7901 Long term (current) use of anticoagulants: Secondary | ICD-10-CM

## 2024-02-06 LAB — POCT INR: INR: 2 (ref 2.0–3.0)

## 2024-02-06 NOTE — Patient Instructions (Addendum)
 Pre visit review using our clinic review tool, if applicable. No additional management support is needed unless otherwise documented below in the visit note.  Continue 6mg  daily except take 3mg  on Mondays, Wednesdays and Saturdays. Recheck in 4 weeks, on 03/05/24.
